# Patient Record
Sex: Female | Born: 1985 | Race: White | Hispanic: No | Marital: Single | Smoking: Former smoker
Health system: Southern US, Community
[De-identification: ages and names within clinical notes are randomized; demographics above are authoritative.]

## PROBLEM LIST (undated history)

## (undated) ENCOUNTER — Inpatient Hospital Stay (HOSPITAL_COMMUNITY): Payer: Self-pay

## (undated) ENCOUNTER — Inpatient Hospital Stay: Payer: Self-pay

## (undated) DIAGNOSIS — F32A Depression, unspecified: Secondary | ICD-10-CM

## (undated) DIAGNOSIS — D649 Anemia, unspecified: Secondary | ICD-10-CM

## (undated) DIAGNOSIS — F1490 Cocaine use, unspecified, uncomplicated: Secondary | ICD-10-CM

## (undated) DIAGNOSIS — A549 Gonococcal infection, unspecified: Secondary | ICD-10-CM

## (undated) DIAGNOSIS — Z87440 Personal history of urinary (tract) infections: Secondary | ICD-10-CM

## (undated) DIAGNOSIS — N390 Urinary tract infection, site not specified: Secondary | ICD-10-CM

## (undated) DIAGNOSIS — F1010 Alcohol abuse, uncomplicated: Secondary | ICD-10-CM

## (undated) DIAGNOSIS — O99320 Drug use complicating pregnancy, unspecified trimester: Secondary | ICD-10-CM

## (undated) DIAGNOSIS — F1210 Cannabis abuse, uncomplicated: Secondary | ICD-10-CM

## (undated) DIAGNOSIS — R87620 Atypical squamous cells of undetermined significance on cytologic smear of vagina (ASC-US): Secondary | ICD-10-CM

## (undated) DIAGNOSIS — O0930 Supervision of pregnancy with insufficient antenatal care, unspecified trimester: Secondary | ICD-10-CM

## (undated) DIAGNOSIS — Z641 Problems related to multiparity: Secondary | ICD-10-CM

## (undated) DIAGNOSIS — Z8619 Personal history of other infectious and parasitic diseases: Secondary | ICD-10-CM

## (undated) DIAGNOSIS — A749 Chlamydial infection, unspecified: Secondary | ICD-10-CM

## (undated) HISTORY — DX: Personal history of urinary (tract) infections: Z87.440

## (undated) HISTORY — DX: Depression, unspecified: F32.A

## (undated) HISTORY — PX: OTHER SURGICAL HISTORY: SHX169

## (undated) HISTORY — PX: NO PAST SURGERIES: SHX2092

## (undated) HISTORY — DX: Anemia, unspecified: D64.9

---
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
## 2006-02-28 ENCOUNTER — Ambulatory Visit (HOSPITAL_COMMUNITY): Admission: RE | Admit: 2006-02-28 | Discharge: 2006-02-28 | Payer: Self-pay | Admitting: Obstetrics

## 2006-05-08 ENCOUNTER — Inpatient Hospital Stay (HOSPITAL_COMMUNITY): Admission: AD | Admit: 2006-05-08 | Discharge: 2006-05-08 | Payer: Self-pay | Admitting: Obstetrics & Gynecology

## 2006-06-29 ENCOUNTER — Inpatient Hospital Stay (HOSPITAL_COMMUNITY): Admission: AD | Admit: 2006-06-29 | Discharge: 2006-06-30 | Payer: Self-pay | Admitting: Obstetrics & Gynecology

## 2006-07-09 ENCOUNTER — Inpatient Hospital Stay (HOSPITAL_COMMUNITY): Admission: AD | Admit: 2006-07-09 | Discharge: 2006-07-10 | Payer: Self-pay | Admitting: Obstetrics & Gynecology

## 2006-07-29 ENCOUNTER — Inpatient Hospital Stay (HOSPITAL_COMMUNITY): Admission: AD | Admit: 2006-07-29 | Discharge: 2006-07-29 | Payer: Self-pay | Admitting: Obstetrics

## 2006-07-30 ENCOUNTER — Inpatient Hospital Stay (HOSPITAL_COMMUNITY): Admission: AD | Admit: 2006-07-30 | Discharge: 2006-07-31 | Payer: Self-pay | Admitting: Obstetrics

## 2007-05-25 ENCOUNTER — Emergency Department (HOSPITAL_COMMUNITY): Admission: EM | Admit: 2007-05-25 | Discharge: 2007-05-25 | Payer: Self-pay | Admitting: Emergency Medicine

## 2007-06-01 ENCOUNTER — Emergency Department (HOSPITAL_COMMUNITY): Admission: EM | Admit: 2007-06-01 | Discharge: 2007-06-01 | Payer: Self-pay | Admitting: Emergency Medicine

## 2007-08-19 ENCOUNTER — Emergency Department (HOSPITAL_COMMUNITY): Admission: EM | Admit: 2007-08-19 | Discharge: 2007-08-19 | Payer: Self-pay | Admitting: Emergency Medicine

## 2008-09-29 ENCOUNTER — Emergency Department (HOSPITAL_COMMUNITY): Admission: EM | Admit: 2008-09-29 | Discharge: 2008-09-29 | Payer: Self-pay | Admitting: Emergency Medicine

## 2010-12-01 LAB — KOH PREP: KOH Prep: NONE SEEN

## 2010-12-01 LAB — POCT RAPID STREP A: Streptococcus, Group A Screen (Direct): POSITIVE — AB

## 2010-12-01 LAB — CULTURE, FUNGUS WITHOUT SMEAR

## 2011-02-07 ENCOUNTER — Encounter (HOSPITAL_COMMUNITY): Payer: Self-pay

## 2011-02-07 ENCOUNTER — Inpatient Hospital Stay (HOSPITAL_COMMUNITY)
Admission: AD | Admit: 2011-02-07 | Discharge: 2011-02-07 | Disposition: A | Discharge status: Home / Self Care | Payer: Medicaid Other | Source: Ambulatory Visit | Attending: Obstetrics & Gynecology | Admitting: Obstetrics & Gynecology

## 2011-02-07 DIAGNOSIS — O0930 Supervision of pregnancy with insufficient antenatal care, unspecified trimester: Secondary | ICD-10-CM | POA: Insufficient documentation

## 2011-02-07 DIAGNOSIS — R109 Unspecified abdominal pain: Secondary | ICD-10-CM | POA: Insufficient documentation

## 2011-02-07 DIAGNOSIS — Z3490 Encounter for supervision of normal pregnancy, unspecified, unspecified trimester: Secondary | ICD-10-CM

## 2011-02-07 DIAGNOSIS — O99891 Other specified diseases and conditions complicating pregnancy: Secondary | ICD-10-CM | POA: Insufficient documentation

## 2011-02-07 LAB — URINALYSIS, ROUTINE W REFLEX MICROSCOPIC
Bilirubin Urine: NEGATIVE
Nitrite: NEGATIVE
Protein, ur: NEGATIVE mg/dL
Specific Gravity, Urine: 1.02 (ref 1.005–1.030)
Urobilinogen, UA: 0.2 mg/dL (ref 0.0–1.0)

## 2011-02-07 LAB — URINE MICROSCOPIC-ADD ON

## 2011-02-07 MED ORDER — PRENATAL RX 60-1 MG PO TABS: 1.0000 | ORAL_TABLET | Freq: Every day | ORAL | Status: DC

## 2011-02-07 NOTE — ED Notes (Signed)
Patient states in addition to concerns mentioned on arrival, she has noticed some soreness and "peeling" around L nipple. Upon exam, appears to be scaly or crusty, but does not give appearance of infection. No redness or swelling.

## 2011-02-07 NOTE — ED Provider Notes (Addendum)
History     Chief Complaint  Patient presents with  . Abdominal Pain   HPI Anum L Sedberry 25 y.o. female  [id number] at [redacted]w[redacted]d with no prenatal care attempting to establish.   Patient with no complaints. ROS shows discharge 1 week ago which resolved with femina women's wash. Cramping in lower abdomen 1-2x a day for short period while resting.   Patient denies contractions/decreased fetal movement/blood from vagina/rush of fluid.     OB History    Grav Para Term Preterm Abortions TAB SAB Ect Mult Living   6 5 5  0 0 0 0 0 0 5      History reviewed. No pertinent past medical history.  History reviewed. No pertinent past surgical history.  Family History  Problem Relation Age of Onset  . Anesthesia problems Neg Hx     History  Substance Use Topics  . Smoking status: Current Everyday Smoker -- 0.2 packs/day  . Smokeless tobacco: Never Used  . Alcohol Use: No    Allergies: No Known Allergies  Prescriptions prior to admission  Medication Sig Dispense Refill  . acetaminophen (TYLENOL) 500 MG tablet Take 1,000 mg by mouth every 6 (six) hours as needed. Takes for headaches         ROS negative except as noted in HPI   Physical Exam   Blood pressure 112/60, pulse 107, temperature 98.5 F (36.9 C), temperature source Oral, resp. rate 20, height 5\' 3"  (1.6 m), weight 79.652 kg (175 lb 9.6 oz), last menstrual period 08/29/2010, SpO2 98.00%.  Physical Exam  Constitutional: She is oriented to person, place, and time. She appears well-developed and well-nourished. No distress.  Cardiovascular: Normal rate and regular rhythm.  Exam reveals no gallop and no friction rub.   No murmur heard. Respiratory: Breath sounds normal. No respiratory distress. She has no wheezes. She has no rales.  GI:       Gravid. Size consistent with 23 week IUP.   Musculoskeletal: Normal range of motion.  Neurological: She is alert and oriented to person, place, and time.  Psychiatric: She has a normal  mood and affect. Her behavior is normal.   FHT-no contractions. 150 baseline. Moderate variability. No accels or decels.  MAU Course  Procedures  MDM Needs to establish [facility] Discharge resolved 1 week ago.    Assessment and Plan  #1 [id number] at [redacted]w[redacted]d #2 insufficient prenatal care #3 no acute issues.   Given health department contact information and stressed importance of follow up. Patient expresses understanding but says she has tried before. Told patient she needs to go there everyday until she gets established.  Rx for prenatal vitamin.  Given handout of providers as well as specific information on how to enroll with [facility].   Case discussed with Philipp Deputy, CNM  HUNTER, STEPHEN 02/07/2011, 2:46 PM

## 2011-02-07 NOTE — Progress Notes (Signed)
Patient states she has had no prenatal care. Has been having some lower abdominal cramping and a vaginal discharge with a slight odor. Used OTC feminine product that caused irritation. Reports good fetal movement, no bleeding or leaking.

## 2011-03-07 NOTE — L&D Delivery Note (Signed)
Delivery Note At 7:27 PM a viable and healthy female was delivered via Vaginal, Spontaneous Delivery (Presentation: ; Occiput Anterior).  APGAR: 8, 9; weight .   Placenta status: , Spontaneous.  Cord: 3 vessels with the following complications: .  Cord pH: Not indicated  Anesthesia: Epidural  Episiotomy: None Lacerations: None Suture Repair: N/A Est. Blood Loss (mL):  Mom to postpartum.  Baby to nursery-stable. Cathie Beams, CNM present throughout entire delivery.  Glendell Fouse 05/31/2011, 7:51 PM

## 2011-04-02 ENCOUNTER — Encounter (HOSPITAL_COMMUNITY): Payer: Self-pay | Admitting: Emergency Medicine

## 2011-04-02 ENCOUNTER — Emergency Department (HOSPITAL_COMMUNITY)
Admission: EM | Admit: 2011-04-02 | Discharge: 2011-04-02 | Disposition: A | Discharge status: Home / Self Care | Payer: Medicaid Other | Attending: Emergency Medicine | Admitting: Emergency Medicine

## 2011-04-02 DIAGNOSIS — L2989 Other pruritus: Secondary | ICD-10-CM | POA: Insufficient documentation

## 2011-04-02 DIAGNOSIS — B009 Herpesviral infection, unspecified: Secondary | ICD-10-CM | POA: Insufficient documentation

## 2011-04-02 DIAGNOSIS — O9989 Other specified diseases and conditions complicating pregnancy, childbirth and the puerperium: Secondary | ICD-10-CM | POA: Insufficient documentation

## 2011-04-02 DIAGNOSIS — R21 Rash and other nonspecific skin eruption: Secondary | ICD-10-CM | POA: Insufficient documentation

## 2011-04-02 DIAGNOSIS — L298 Other pruritus: Secondary | ICD-10-CM | POA: Insufficient documentation

## 2011-04-02 DIAGNOSIS — L089 Local infection of the skin and subcutaneous tissue, unspecified: Secondary | ICD-10-CM | POA: Insufficient documentation

## 2011-04-02 DIAGNOSIS — B001 Herpesviral vesicular dermatitis: Secondary | ICD-10-CM

## 2011-04-02 MED ORDER — CEPHALEXIN 500 MG PO CAPS: 500.0000 mg | ORAL_CAPSULE | Freq: Four times a day (QID) | ORAL | Status: AC

## 2011-04-02 NOTE — ED Provider Notes (Signed)
Medical screening examination/treatment/procedure(s) were conducted as a shared visit with non-physician practitioner(s) and myself.  I personally evaluated the patient during the encounter  Lesion to the face which has been going on for the past 2 days. Associated pain. His had similar symptoms in the past.  Herpetic lesion to the lip.  Dayton Bailiff, MD 04/02/11 1700

## 2011-04-02 NOTE — ED Provider Notes (Signed)
History     CSN: [id number]  Arrival date & time 04/02/11  1525   First MD Initiated Contact with Patient 04/02/11 1551      Chief Complaint  Patient presents with  . Abscess    (Consider location/radiation/quality/duration/timing/severity/associated sxs/prior treatment)  HPI 26 year old white pregnant female presents with lip rash and sore. The onset was 2 days ago and gradual. She reports increased swelling but denies pain and itching. She denies a viral prodrome of itching and burning. She denies similar symptoms in past. She has not tried anything for relief. She also complains of a pruritic rash. It began several months ago but since she became pregnant. It started on her R elbow, then spread to her L elbow, then chest, then feet and ankles. She describes it as intermittent and lasts for about a week at a time. She has not tried anything for relief.   History reviewed. No pertinent past medical history.  History reviewed. No pertinent past surgical history.  Family History  Problem Relation Age of Onset  . Anesthesia problems Neg Hx     History  Substance Use Topics  . Smoking status: Current Everyday Smoker -- 0.2 packs/day  . Smokeless tobacco: Never Used  . Alcohol Use: No    OB History    Grav Para Term Preterm Abortions TAB SAB Ect Mult Living   6 5 5  0 0 0 0 0 0 5      Review of Systems All pertinent positives and negatives reviewed in the history of present illness  Allergies  Review of patient's allergies indicates no known allergies.  Home Medications   Current Outpatient Rx  Name Route Sig Dispense Refill  . NAPROXEN SODIUM 220 MG PO TABS Oral Take 220 mg by mouth daily as needed. For pain      BP 110/53  Pulse 102  Temp(Src) 98.2 F (36.8 C) (Oral)  Resp 18  SpO2 97%  LMP 08/29/2010  Physical Exam  Constitutional: She appears well-developed and well-nourished.  HENT:       Patient has a area to the left upper lip laterally and appears to  be either herpes type rash or a small area of skin infection.  Still feel that herpes labialis the most likely cause here but the patient feels that this is not like her previous.  She states she peeled back some skin over this area about 3 days ago  Eyes: Pupils are equal, round, and reactive to light.  Cardiovascular: Normal rate, regular rhythm and normal heart sounds.   Pulmonary/Chest: Effort normal and breath sounds normal.  Skin: Skin is warm and dry. Rash noted.    ED Course  Procedures (including critical care time)     The patient has a herpes labialis vs small infection of the L upper lip. I feel that this is a cold sore rather than an infection but the patient is concerned about an infection. I advised she can use warm compresses. Told to return here as needed. Follow up at [facility] for her pregnancy.      MDM          Carlyle Dolly, PA-C 04/02/11 [phone]

## 2011-04-02 NOTE — ED Notes (Addendum)
Pt reports waking yesterday w/sore to L corner of mouth and L top lip. Pt also states she is Rh negative and is requesting to receive Rhogam injection. Pt reports she is unable to get a ride to a Dr., pt denies receiving any prenatal care. Pt was dropped off by a friend today

## 2011-04-02 NOTE — ED Notes (Signed)
Pt informed to f/u at [facility] tomorrow for evaluation and f/u care for Rhogam, EDP King and EDPA Lawyer unable to locate needed info and has instructed Clinical research associate to have pt f/u w/MAU. This Clinical research associate spoke w/rapid response OBGYN RN Tresa Endo, she Web designer pt has until 32 wks to receive Rhogam injection, pt is informed of the urgency to receive injection prior to 32 wks

## 2011-04-02 NOTE — ED Notes (Addendum)
C/o bump on lip since Saturday morning.  Also c/o rash on neck, arms, and feet for over 1 month.  Pt states she is 7 months pregnant.  Pt states she is Rh negative and has had no prenatal care. Pt states she has no way to get to a doctor's appt and that she really needs to get Rhogam while she is here. This is her 6th pregnancy.

## 2011-04-04 ENCOUNTER — Encounter (HOSPITAL_COMMUNITY): Payer: Self-pay | Admitting: *Deleted

## 2011-04-04 ENCOUNTER — Inpatient Hospital Stay (HOSPITAL_COMMUNITY)
Admission: AD | Admit: 2011-04-04 | Discharge: 2011-04-04 | Disposition: A | Discharge status: Home / Self Care | Payer: Medicaid Other | Source: Ambulatory Visit | Attending: Obstetrics and Gynecology | Admitting: Obstetrics and Gynecology

## 2011-04-04 DIAGNOSIS — R21 Rash and other nonspecific skin eruption: Secondary | ICD-10-CM | POA: Insufficient documentation

## 2011-04-04 DIAGNOSIS — O2390 Unspecified genitourinary tract infection in pregnancy, unspecified trimester: Secondary | ICD-10-CM | POA: Insufficient documentation

## 2011-04-04 DIAGNOSIS — O0930 Supervision of pregnancy with insufficient antenatal care, unspecified trimester: Secondary | ICD-10-CM | POA: Insufficient documentation

## 2011-04-04 DIAGNOSIS — N390 Urinary tract infection, site not specified: Secondary | ICD-10-CM | POA: Insufficient documentation

## 2011-04-04 DIAGNOSIS — O98519 Other viral diseases complicating pregnancy, unspecified trimester: Secondary | ICD-10-CM | POA: Insufficient documentation

## 2011-04-04 DIAGNOSIS — B009 Herpesviral infection, unspecified: Secondary | ICD-10-CM | POA: Insufficient documentation

## 2011-04-04 DIAGNOSIS — O99891 Other specified diseases and conditions complicating pregnancy: Secondary | ICD-10-CM | POA: Insufficient documentation

## 2011-04-04 HISTORY — DX: Chlamydial infection, unspecified: A74.9

## 2011-04-04 LAB — RAPID URINE DRUG SCREEN, HOSP PERFORMED
Benzodiazepines: NOT DETECTED
Cocaine: NOT DETECTED
Opiates: NOT DETECTED
Tetrahydrocannabinol: POSITIVE — AB

## 2011-04-04 LAB — URINALYSIS, ROUTINE W REFLEX MICROSCOPIC
Glucose, UA: NEGATIVE mg/dL
Nitrite: NEGATIVE
Specific Gravity, Urine: 1.025 (ref 1.005–1.030)
pH: 6 (ref 5.0–8.0)

## 2011-04-04 LAB — DIFFERENTIAL
Eosinophils Relative: 1 % (ref 0–5)
Lymphocytes Relative: 15 % (ref 12–46)
Lymphs Abs: 1.9 10*3/uL (ref 0.7–4.0)
Monocytes Relative: 6 % (ref 3–12)

## 2011-04-04 LAB — URINE MICROSCOPIC-ADD ON

## 2011-04-04 LAB — CBC
HCT: 34.2 % — ABNORMAL LOW (ref 36.0–46.0)
Hemoglobin: 11.6 g/dL — ABNORMAL LOW (ref 12.0–15.0)
MCH: 29.6 pg (ref 26.0–34.0)
MCV: 87.2 fL (ref 78.0–100.0)
Platelets: 148 10*3/uL — ABNORMAL LOW (ref 150–400)
RBC: 3.92 MIL/uL (ref 3.87–5.11)
WBC: 12.6 10*3/uL — ABNORMAL HIGH (ref 4.0–10.5)

## 2011-04-04 LAB — HEPATITIS B SURFACE ANTIGEN: Hepatitis B Surface Ag: NEGATIVE

## 2011-04-04 LAB — TYPE AND SCREEN: ABO/RH(D): A NEG

## 2011-04-04 MED ORDER — PERMETHRIN 5 % EX CREA: TOPICAL_CREAM | Freq: Once | CUTANEOUS | Status: AC

## 2011-04-04 NOTE — ED Provider Notes (Signed)
History     No chief complaint on file.  HPI  3yr [id number] at 31.1wks by LMP presents for prenatal care  Patient states that her LMP was 08/29/10, and she has not been able to establish prenatal care because of transportation issues. Does not think that she has any issues with this pregnancy, and describes good fetal movement. She denies any leakage of fluid or discharge. She is not feeling any contractions. She states that she is Rh negative and has received rhogam in past pregnancies.   She also complains of a lesion on her left upper lip. She was seen at [facility] for similar approx 4 days ago, was told it was either a cold sore, herpes, or hormonal changes of pregnancy. She was given a Rx for Cephalexin for this. She states that she has had cold sores in the past but not like this. Some prodomal tingling was present a few days prior. Denies any other lesions, denies any genital lesions, denies any history of genital lesions. Denies any partners with similar symptoms.   Patient also complains of pruritic rash on antecubital fossa, wrists, within finger webs, that has been present for approximately one week. She seems to think that her daughter has similar symptoms. She denies any rashes or spots on her truncal area. She has had scabies before and seems to think that her current symptoms are similar.   Socially, she lives with the father of her previous children. She states that she does not know the exact father of this pregnancy. She admits to smoking with this pregnancy, as well as EtOH and MJ use. She denies any other illicit drugs, has never used IV drugs.     OB History    Grav Para Term Preterm Abortions TAB SAB Ect Mult Living   6 5 5  0 0 0 0 0 0 5      Past Medical History  Diagnosis Date  . Asthma     as child  . Chlamydia     Past Surgical History  Procedure Date  . No past surgeries     Family History  Problem Relation Age of Onset  . Anesthesia problems Neg Hx       History  Substance Use Topics  . Smoking status: Current Everyday Smoker -- 0.2 packs/day for 14 years  . Smokeless tobacco: Never Used  . Alcohol Use: No     not with preg    Allergies: No Known Allergies  Prescriptions prior to admission  Medication Sig Dispense Refill  . naproxen sodium (ANAPROX) 220 MG tablet Take 220 mg by mouth daily as needed. For pain      . Prenatal Vit-Fe Fumarate-FA (PRENATAL MULTIVITAMIN) TABS Take 1 tablet by mouth daily.      . cephALEXin (KEFLEX) 500 MG capsule Take 1 capsule (500 mg total) by mouth 4 (four) times daily.  28 capsule  0    Review of Systems  Constitutional: Negative for fever.  Eyes: Negative for blurred vision and double vision.  Respiratory: Negative for shortness of breath.   Cardiovascular: Negative for chest pain.  Gastrointestinal: Negative for heartburn, nausea, vomiting, abdominal pain, diarrhea and constipation.  Skin: Positive for itching and rash (see HPI).  Neurological: Negative for headaches.  All other systems reviewed and are negative.   Physical Exam   Blood pressure 118/59, pulse 93, temperature 98.7 F (37.1 C), temperature source Oral, resp. rate 20, height 5' 3.5" (1.613 m), weight 83.28 kg (183 lb 9.6  oz), last menstrual period 08/29/2010.  Physical Exam  Nursing note and vitals reviewed. Constitutional: She is oriented to person, place, and time. She appears well-developed and well-nourished. No distress.  HENT:  Head: Normocephalic and atraumatic.  Mouth/Throat: Oral lesions present.    Eyes: EOM are normal. Pupils are equal, round, and reactive to light.  Cardiovascular: Normal rate, regular rhythm and normal heart sounds.  Exam reveals no gallop and no friction rub.   No murmur heard. Respiratory: Effort normal and breath sounds normal. No respiratory distress. She has no wheezes. She has no rales. She exhibits no tenderness.  GI: Soft. She exhibits no distension and no mass. There is no  tenderness. There is no rebound and no guarding.       Gravid abdomen, fundal height 29cm  Musculoskeletal: Normal range of motion. She exhibits no edema and no tenderness.  Neurological: She is alert and oriented to person, place, and time. She has normal reflexes.  Skin: Skin is warm and dry. Rash noted. No purpura noted. Rash is papular, maculopapular, vesicular and urticarial. Rash is not nodular. No erythema. No pallor.     Psychiatric: She has a normal mood and affect. Her behavior is normal. Judgment and thought content normal.    MAU Course  Procedures  MDM 0400 - intital assessment. Evaluated patient, will obtain basic prenatal labs, and schedule outpatient followup. UA pending , will add on UDS. Anticipate DC to home, permethrin cream for scabies.  Assessment and Plan  Pregnancy  - no prenatal care  - have scheduled appointment for patient in low risk clinic tomorrow  - will help patient with bus passes for transportation  - will obtain basic prenatal labs today  - Patient is a(-); patient will need Rhogam   - UDS pending  Facial Lesion  - most like herpetic  - educated patient on natural course of disease progression  Rash  - given distribution of skin folds, and similar Sx in close contacts, scabies is high on differential  - PUPPP is on differential, but absence of skin lesions on truncal areas makes this less likely  - will treat with permethrin cream, information given on proper infestation eradication  UTI  - UA mildly concerning for UTI  - patient was given kefflex 2d ago at [facility]  - urged patient to fill that prescription    Cameron Proud 04/04/2011, 4:28 PM

## 2011-04-04 NOTE — Progress Notes (Signed)
Went to [facility] on Sun, to get lip lesion checked out, was told it was due to hormones.  Pt was encouraged to get prenatal care.  Pt had told them she needed Rhogham.  Was instructed to come here for check up, [place] and Rhogham.  Has been having braxton hicks, nothing contstant or regular, but is worried about it.

## 2011-04-05 ENCOUNTER — Encounter: Payer: Self-pay | Admitting: *Deleted

## 2011-04-05 ENCOUNTER — Ambulatory Visit (INDEPENDENT_AMBULATORY_CARE_PROVIDER_SITE_OTHER): Payer: Self-pay | Admitting: Obstetrics and Gynecology

## 2011-04-05 DIAGNOSIS — Z3480 Encounter for supervision of other normal pregnancy, unspecified trimester: Secondary | ICD-10-CM

## 2011-04-05 DIAGNOSIS — Z3490 Encounter for supervision of normal pregnancy, unspecified, unspecified trimester: Secondary | ICD-10-CM | POA: Insufficient documentation

## 2011-04-05 DIAGNOSIS — Z6791 Unspecified blood type, Rh negative: Secondary | ICD-10-CM

## 2011-04-05 DIAGNOSIS — O360990 Maternal care for other rhesus isoimmunization, unspecified trimester, not applicable or unspecified: Secondary | ICD-10-CM

## 2011-04-05 DIAGNOSIS — O0930 Supervision of pregnancy with insufficient antenatal care, unspecified trimester: Secondary | ICD-10-CM | POA: Insufficient documentation

## 2011-04-05 LAB — POCT URINALYSIS DIP (DEVICE)
Ketones, ur: NEGATIVE mg/dL
Leukocytes, UA: NEGATIVE
Protein, ur: NEGATIVE mg/dL
Specific Gravity, Urine: >=1.03 (ref 1.005–1.030)
Urobilinogen, UA: 0.2 mg/dL (ref 0.0–1.0)
pH: 6 (ref 5.0–8.0)

## 2011-04-05 LAB — GC/CHLAMYDIA PROBE AMP, GENITAL: Gonorrhea: NEGATIVE

## 2011-04-05 MED ORDER — RHO D IMMUNE GLOBULIN 1500 UNIT/2ML IJ SOLN: 300.0000 ug | Freq: Once | INTRAMUSCULAR | Status: DC

## 2011-04-05 NOTE — Progress Notes (Signed)
Rhophylac 1500 IU(364mcg) given IM left ventrogluteal at 11:09, patient tolerated without complaints.   Lot #[id number], exp [id number], NDC [id number]

## 2011-04-05 NOTE — Progress Notes (Signed)
Addended by: Faythe Casa on: 04/05/2011 11:34 AM   Modules accepted: Orders

## 2011-04-05 NOTE — Progress Notes (Signed)
Patient without complaints, allowed to check out

## 2011-04-05 NOTE — Progress Notes (Signed)
Ob US scheduled 04/07/11 @ 1430

## 2011-04-05 NOTE — Progress Notes (Signed)
Addended by: Doreen Salvage on: 04/05/2011 11:32 AM   Modules accepted: Orders

## 2011-04-05 NOTE — Progress Notes (Signed)
[id number] @ 31 2/7 weeks presenting for her initial prenatal visit. Patient had been seen in MAU @ 23 and 31 weeks. No other prenatal care. Past obstetrical, medical, surgical, family, and social history reviewed.  Significant risk factors include history of childhood asthma, current tobacco use, UDS on 01/29 positive for THC, and Rh-negative status. Social history significant for uncertainty who FOB is. FOB of other 5 children, and patient's boyfriend, aware may not be FOB of current baby but will still be involved in baby's care.  Patient complaining of persistent rash on her wrists, popliteal fossa, and right lower extremity. Dx with scabies and given Rx but has not gotten filled yet.  She reports stopping THC about 3-4 days ago. Still smoking 2-3 cigarettes daily.  PE: Gen: NAD Psych: appropriate Neck: no thyromegaly CV: RRR, no m/r/g Pulm: CTAB without w/r/r Abd: NABS, soft, NT, gravid Genital: vulva and vaginal normal without erythema, tenderness, or other lesions. Thick white discharge posterior vault. Cervix non-friable without lesions.   A/P: -Rh negative. Rhogam today.  -1-hour glucola, Pap smear, GC/Chlamydia today. -Social Work consult from hospital today due to finances (patient did not seek prenatal care earlier due to not having transportation). -Will schedule anatomic U/S to confirm dating. Patient unsure of LMP but had not been on birth control.  -Follow-up in 2 weeks.

## 2011-04-05 NOTE — Progress Notes (Signed)
Addended by: Madolyn Frieze, Marylene Land J on: 04/05/2011 11:00 AM   Modules accepted: Orders

## 2011-04-07 ENCOUNTER — Ambulatory Visit (HOSPITAL_COMMUNITY)
Admission: RE | Admit: 2011-04-07 | Discharge: 2011-04-07 | Disposition: A | Discharge status: Home / Self Care | Payer: Medicaid Other | Source: Ambulatory Visit | Attending: Obstetrics and Gynecology | Admitting: Obstetrics and Gynecology

## 2011-04-07 DIAGNOSIS — Z3689 Encounter for other specified antenatal screening: Secondary | ICD-10-CM | POA: Insufficient documentation

## 2011-04-07 DIAGNOSIS — Z3490 Encounter for supervision of normal pregnancy, unspecified, unspecified trimester: Secondary | ICD-10-CM

## 2011-04-07 DIAGNOSIS — O0930 Supervision of pregnancy with insufficient antenatal care, unspecified trimester: Secondary | ICD-10-CM

## 2011-04-07 NOTE — ED Provider Notes (Signed)
Agree with above note.  Tonya Nichols 04/07/2011 6:11 AM   

## 2011-04-10 ENCOUNTER — Telehealth: Payer: Self-pay

## 2011-04-10 ENCOUNTER — Encounter: Payer: Self-pay | Admitting: *Deleted

## 2011-04-10 NOTE — Telephone Encounter (Signed)
Pt called stating she wants a call back.  Called pt and left message stating return our call to the clinics.

## 2011-04-11 NOTE — Telephone Encounter (Signed)
Called pt and was unable to leave message due to "persons voicemail box being full".

## 2011-04-12 NOTE — Telephone Encounter (Signed)
Called pt and heard message stating that the mailbox is full and cannot accept new messages.

## 2011-04-17 ENCOUNTER — Encounter: Payer: Self-pay | Admitting: Family Medicine

## 2011-04-17 ENCOUNTER — Ambulatory Visit (INDEPENDENT_AMBULATORY_CARE_PROVIDER_SITE_OTHER): Payer: Self-pay | Admitting: Family Medicine

## 2011-04-17 VITALS — BP 118/78 | HR 96 | Temp 97.4°F | Wt 185.1 lb

## 2011-04-17 DIAGNOSIS — O0930 Supervision of pregnancy with insufficient antenatal care, unspecified trimester: Secondary | ICD-10-CM

## 2011-04-17 DIAGNOSIS — Z3490 Encounter for supervision of normal pregnancy, unspecified, unspecified trimester: Secondary | ICD-10-CM

## 2011-04-17 LAB — POCT URINALYSIS DIP (DEVICE)
Ketones, ur: NEGATIVE mg/dL
Nitrite: NEGATIVE
Protein, ur: NEGATIVE mg/dL
pH: 6 (ref 5.0–8.0)

## 2011-04-17 NOTE — Patient Instructions (Signed)
Pregnancy - Third Trimester The third trimester of pregnancy (the last 3 months) is a period of the most rapid growth for you and your baby. The baby approaches a length of 20 inches and a weight of 6 to 10 pounds. The baby is adding on fat and getting ready for life outside your body. While inside, babies have periods of sleeping and waking, suck their thumbs, and hiccups. You can often feel small contractions of the uterus. This is false labor. It is also called Braxton-Hicks contractions. This is like a practice for labor. The usual problems in this stage of pregnancy include more difficulty breathing, swelling of the hands and feet from water retention, and having to urinate more often because of the uterus and baby pressing on your bladder.  PRENATAL EXAMS  Blood work may continue to be done during prenatal exams. These tests are done to check on your health and the probable health of your baby. Blood work is used to follow your blood levels (hemoglobin). Anemia (low hemoglobin) is common during pregnancy. Iron and vitamins are given to help prevent this. You may also continue to be checked for diabetes. Some of the past blood tests may be done again.   The size of the uterus is measured during each visit. This makes sure your baby is growing properly according to your pregnancy dates.   Your blood pressure is checked every prenatal visit. This is to make sure you are not getting toxemia.   Your urine is checked every prenatal visit for infection, diabetes and protein.   Your weight is checked at each visit. This is done to make sure gains are happening at the suggested rate and that you and your baby are growing normally.   Sometimes, an ultrasound is performed to confirm the position and the proper growth and development of the baby. This is a test done that bounces harmless sound waves off the baby so your caregiver can more accurately determine due dates.   Discuss the type of pain  medication and anesthesia you will have during your labor and delivery.   Discuss the possibility and anesthesia if a Cesarean Section might be necessary.   Inform your caregiver if there is any mental or physical violence at home.  Sometimes, a specialized non-stress test, contraction stress test and biophysical profile are done to make sure the baby is not having a problem. Checking the amniotic fluid surrounding the baby is called an amniocentesis. The amniotic fluid is removed by sticking a needle into the belly (abdomen). This is sometimes done near the end of pregnancy if an early delivery is required. In this case, it is done to help make sure the baby's lungs are mature enough for the baby to live outside of the womb. If the lungs are not mature and it is unsafe to deliver the baby, an injection of cortisone medication is given to the mother 1 to 2 days before the delivery. This helps the baby's lungs mature and makes it safer to deliver the baby. CHANGES OCCURING IN THE THIRD TRIMESTER OF PREGNANCY Your body goes through many changes during pregnancy. They vary from person to person. Talk to your caregiver about changes you notice and are concerned about.  During the last trimester, you have probably had an increase in your appetite. It is normal to have cravings for certain foods. This varies from person to person and pregnancy to pregnancy.   You may begin to get stretch marks on your hips,   abdomen, and breasts. These are normal changes in the body during pregnancy. There are no exercises or medications to take which prevent this change.   Constipation may be treated with a stool softener or adding bulk to your diet. Drinking lots of fluids, fiber in vegetables, fruits, and whole grains are helpful.   Exercising is also helpful. If you have been very active up until your pregnancy, most of these activities can be continued during your pregnancy. If you have been less active, it is helpful  to start an exercise program such as walking. Consult your caregiver before starting exercise programs.   Avoid all smoking, alcohol, un-prescribed drugs, herbs and "street drugs" during your pregnancy. These chemicals affect the formation and growth of the baby. Avoid chemicals throughout the pregnancy to ensure the delivery of a healthy infant.   Backache, varicose veins and hemorrhoids may develop or get worse.   You will tire more easily in the third trimester, which is normal.   The baby's movements may be stronger and more often.   You may become short of breath easily.   Your belly button may stick out.   A yellow discharge may leak from your breasts called colostrum.   You may have a bloody mucus discharge. This usually occurs a few days to a week before labor begins.  HOME CARE INSTRUCTIONS   Keep your caregiver's appointments. Follow your caregiver's instructions regarding medication use, exercise, and diet.   During pregnancy, you are providing food for you and your baby. Continue to eat regular, well-balanced meals. Choose foods such as meat, fish, milk and other low fat dairy products, vegetables, fruits, and whole-grain breads and cereals. Your caregiver will tell you of the ideal weight gain.   A physical sexual relationship may be continued throughout pregnancy if there are no other problems such as early (premature) leaking of amniotic fluid from the membranes, vaginal bleeding, or belly (abdominal) pain.   Exercise regularly if there are no restrictions. Check with your caregiver if you are unsure of the safety of your exercises. Greater weight gain will occur in the last 2 trimesters of pregnancy. Exercising helps:   Control your weight.   Get you in shape for labor and delivery.   You lose weight after you deliver.   Rest a lot with legs elevated, or as needed for leg cramps or low back pain.   Wear a good support or jogging bra for breast tenderness during  pregnancy. This may help if worn during sleep. Pads or tissues may be used in the bra if you are leaking colostrum.   Do not use hot tubs, steam rooms, or saunas.   Wear your seat belt when driving. This protects you and your baby if you are in an accident.   Avoid raw meat, cat litter boxes and soil used by cats. These carry germs that can cause birth defects in the baby.   It is easier to loose urine during pregnancy. Tightening up and strengthening the pelvic muscles will help with this problem. You can practice stopping your urination while you are going to the bathroom. These are the same muscles you need to strengthen. It is also the muscles you would use if you were trying to stop from passing gas. You can practice tightening these muscles up 10 times a set and repeating this about 3 times per day. Once you know what muscles to tighten up, do not perform these exercises during urination. It is more likely   to cause an infection by backing up the urine.   Ask for help if you have financial, counseling or nutritional needs during pregnancy. Your caregiver will be able to offer counseling for these needs as well as refer you for other special needs.   Make a list of emergency phone numbers and have them available.   Plan on getting help from family or friends when you go home from the hospital.   Make a trial run to the hospital.   Take prenatal classes with the father to understand, practice and ask questions about the labor and delivery.   Prepare the baby's room/nursery.   Do not travel out of the city unless it is absolutely necessary and with the advice of your caregiver.   Wear only low or no heal shoes to have better balance and prevent falling.  MEDICATIONS AND DRUG USE IN PREGNANCY  Take prenatal vitamins as directed. The vitamin should contain 1 milligram of folic acid. Keep all vitamins out of reach of children. Only a couple vitamins or tablets containing iron may be fatal  to a baby or young child when ingested.   Avoid use of all medications, including herbs, over-the-counter medications, not prescribed or suggested by your caregiver. Only take over-the-counter or prescription medicines for pain, discomfort, or fever as directed by your caregiver. Do not use aspirin, ibuprofen (Motrin, Advil, Nuprin) or naproxen (Aleve) unless OK'd by your caregiver.   Let your caregiver also know about herbs you may be using.   Alcohol is related to a number of birth defects. This includes fetal alcohol syndrome. All alcohol, in any form, should be avoided completely. Smoking will cause low birth rate and premature babies.   Street/illegal drugs are very harmful to the baby. They are absolutely forbidden. A baby born to an addicted mother will be addicted at birth. The baby will go through the same withdrawal an adult does.  SEEK MEDICAL CARE IF: You have any concerns or worries during your pregnancy. It is better to call with your questions if you feel they cannot wait, rather than worry about them. DECISIONS ABOUT CIRCUMCISION You may or may not know the sex of your baby. If you know your baby is a boy, it may be time to think about circumcision. Circumcision is the removal of the foreskin of the penis. This is the skin that covers the sensitive end of the penis. There is no proven medical need for this. Often this decision is made on what is popular at the time or based upon religious beliefs and social issues. You can discuss these issues with your caregiver or pediatrician. SEEK IMMEDIATE MEDICAL CARE IF:   An unexplained oral temperature above 102 F (38.9 C) develops, or as your caregiver suggests.   You have leaking of fluid from the vagina (birth canal). If leaking membranes are suspected, take your temperature and tell your caregiver of this when you call.   There is vaginal spotting, bleeding or passing clots. Tell your caregiver of the amount and how many pads are  used.   You develop a bad smelling vaginal discharge with a change in the color from clear to white.   You develop vomiting that lasts more than 24 hours.   You develop chills or fever.   You develop shortness of breath.   You develop burning on urination.   You loose more than 2 pounds of weight or gain more than 2 pounds of weight or as suggested by your   caregiver.   You notice sudden swelling of your face, hands, and feet or legs.   You develop belly (abdominal) pain. Round ligament discomfort is a common non-cancerous (benign) cause of abdominal pain in pregnancy. Your caregiver still must evaluate you.   You develop a severe headache that does not go away.   You develop visual problems, blurred or double vision.   If you have not felt your baby move for more than 1 hour. If you think the baby is not moving as much as usual, eat something with sugar in it and lie down on your left side for an hour. The baby should move at least 4 to 5 times per hour. Call right away if your baby moves less than that.   You fall, are in a car accident or any kind of trauma.   There is mental or physical violence at home.  Document Released: 02/14/2001 Document Revised: 11/02/2010 Document Reviewed: 08/19/2008 [employer] Patient Information 2012 [employer]. Birth Control Choices Birth control is the use of any practices, methods, or devices to prevent pregnancy from happening in a sexually active woman.  Below are some birth control choices to help avoid pregnancy.  Not having sex (abstinence) is the surest form of birth control. This requires self-control. There is no risk of acquiring a sexually transmitted disease (STD), including acquired immunodeficiency syndrome (AIDS).   Periodic abstinence requires self-control during certain times of the month.   Calendar method, timing your menstrual periods from month to month.   Ovulation method is avoiding sexual intercourse around the time  you produce an egg (ovulate).   Symptotherm method is avoiding sexual intercourse at the time of ovulation, using a thermometer and ovulation symptoms.   Post ovulation method is the timing of sexual intercourse after you ovulated.  These methods do not protect against STDs, including AIDS.  Birth control pills (BCPs) contain estrogen and progesterone hormone. These medicines work by stopping the egg from forming in the ovary (ovulation). Birth control pills are prescribed by a caregiver who will ask you questions about the risks of taking BCPs. Birth control pills do not protect against STDs, including AIDS.   "Minipill" birth control pills have only the progesterone hormone. They are taken every day of each month and must be prescribed by your caregiver. They do not protect against STDs, including AIDS.   Emergency contraception is often call the "morning after" pill. This pill can be taken right after sex or up to five days after sex if you think your birth control failed, you failed to use contraception, or you were forced to have sex. It is most effective the sooner you take the pills after having sexual intercourse. Do not use emergency contraception as your only form of birth control. Emergency contraceptive pills are available without a prescription. Check with your pharmacist.   Condoms are a thin sheath of latex, synthetic material, or lambskin worn over the penis during sexual intercourse. They can have a spermicide in or on them when you buy them. Latex condoms can prevent pregnancy and STDs. "Natural" or lambskin condoms can prevent pregnancy but may not protect against STDs, including AIDS.   Female condoms are a soft, loose-fitting sheath that is put into the vagina before sexual intercourse. They can prevent pregnancy and STDs, including AIDS.   Sponge is a soft, circular piece of polyurethane foam with spermicide in it that is inserted into the vagina after wetting it and before  sexual intercourse. It does   not require a prescription from your caregiver. It does not protect against STDs, including AIDS.   Diaphragm is a soft, latex, dome-shaped barrier that must be fitted by a caregiver. It is inserted into the vagina, along with a spermicidal jelly. After the proper fitting for a diaphragm, always insert the diaphragm before intercourse. The diaphragm should be left in the vagina for 6 to 8 hours after intercourse. Removal and reinsertion with a spermicide is always necessary after any use. It does not protect against STDs, including AIDS.   Progesterone-only injections are given every 3 months to prevent pregnancy. These injections contain synthetic progesterone and no estrogen. This hormone stops the ovaries from releasing eggs. It also causes the cervical mucus to thicken and changes the uterine lining. This makes it harder for sperm to survive in the uterus. It does not protect against STDs, including AIDS.   Birth Control Patch contains hormones similar to those in birth control pills, so effectiveness, risks, and side effects are similar. It must be changed once a week and is prescribed by a caregiver. It is less effective in very overweight women. It does not protect against STDs, including AIDS.   Vaginal Ring contains hormones similar to those in birth control pills. It is left in place for 3 weeks, removed for 1 week, and then a new one is put back into the vagina. It comes with a timer to put in your purse to help you remember when to take it out or put a new one in. A caregiver's examination and prescription is necessary, just like with birth control pills and the patch. It does not protect against STDs, including AIDS.   Estrogen plus progesterone injections are given every 28 to 30 days. They can be given in the upper arm, thigh, or buttocks. It does not protect against STDs, including AIDS.   Intrauterine device (IUD): copper T or progestin filled is a T-shaped  device that is put in a woman's uterus during a menstrual period to prevent pregnancy. The copper T IUD can last 10 years, and the progestin IUD can last 5 years. The progestin IUD can also help control heavy menstrual periods. It does not protect against STDs, including AIDS. The copper T IUD can be used as emergency contraception if inserted within 5 days of having unprotected intercourse.   Cervical cap is a round, soft latex or plastic cup that fits over the cervix and must be fitted by a caregiver. You do not need to use a spermicide with it or remove and insert it every time you have sexual intercourse. It does not protect against STDs, including AIDS.   Spermicides are chemicals that kill or block sperm from entering the cervix and uterus. They come in the form of creams, jellies, suppositories, foam, or tablets, and they do not require a prescription. They are inserted into the vagina with an applicator before having sexual intercourse. This must be repeated every time you have sexual intercourse.   Withdrawal is using the method of the female withdrawing his penis from sexual intercourse before he has a climax and deposits his sperm. It does not protect against STDs, including AIDS.   Female tubal ligation is when the woman's fallopian tubes are surgically sealed or tied to prevent the egg from traveling to the uterus. It does not protect against STDs, including AIDS.   Female sterilization is when the female has his tubes that carry sperm tied off (vasectomy) to stop sperm from entering the   vagina during sexual intercourse. It does not protect against STDs, including AIDS.  Regardless of which method of birth control you choose, it is still important that you use some form of protection against STDs. Document Released: 02/20/2005 Document Revised: 03/25/2010 Document Reviewed: 01/07/2009 [employer] Patient Information 2012 [employer]. Breastfeeding BENEFITS OF BREASTFEEDING For the baby  The  first milk (colostrum) helps the baby's digestive system function better.   There are antibodies from the mother in the milk that help the baby fight off infections.   The baby has a lower incidence of asthma, allergies, and SIDS (sudden infant death syndrome).   The nutrients in breast milk are better than formulas for the baby and helps the baby's brain grow better.   Babies who breastfeed have less gas, colic, and constipation.  For the mother  Breastfeeding helps develop a very special bond between mother and baby.   It is more convenient, always available at the correct temperature and cheaper than formula feeding.   It burns calories in the mother and helps with losing weight that was gained during pregnancy.   It makes the uterus contract back down to normal size faster and slows bleeding following delivery.   Breastfeeding mothers have a lower risk of developing breast cancer.  NURSE FREQUENTLY  A healthy, full-term baby may breastfeed as often as every hour or space his or her feedings to every 3 hours.   How often to nurse will vary from baby to baby. Watch your baby for signs of hunger, not the clock.   Nurse as often as the baby requests, or when you feel the need to reduce the fullness of your breasts.   Awaken the baby if it has been 3 to 4 hours since the last feeding.   Frequent feeding will help the mother make more milk and will prevent problems like sore nipples and engorgement of the breasts.  BABY'S POSITION AT THE BREAST  Whether lying down or sitting, be sure that the baby's tummy is facing your tummy.   Support the breast with 4 fingers underneath the breast and the thumb above. Make sure your fingers are well away from the nipple and baby's mouth.   Stroke the baby's lips and cheek closest to the breast gently with your finger or nipple.   When the baby's mouth is open wide enough, place all of your nipple and as much of the dark area around the nipple  as possible into your baby's mouth.   Pull the baby in close so the tip of the nose and the baby's cheeks touch the breast during the feeding.  FEEDINGS  The length of each feeding varies from baby to baby and from feeding to feeding.   The baby must suck about 2 to 3 minutes for your milk to get to him or her. This is called a "let down." For this reason, allow the baby to feed on each breast as long as he or she wants. Your baby will end the feeding when he or she has received the right balance of nutrients.   To break the suction, put your finger into the corner of the baby's mouth and slide it between his or her gums before removing your breast from his or her mouth. This will help prevent sore nipples.  REDUCING BREAST ENGORGEMENT  In the first week after your baby is born, you may experience signs of breast engorgement. When breasts are engorged, they feel heavy, warm, full, and may   be tender to the touch. You can reduce engorgement if you:   Nurse frequently, every 2 to 3 hours. Mothers who breastfeed early and often have fewer problems with engorgement.   Place light ice packs on your breasts between feedings. This reduces swelling. Wrap the ice packs in a lightweight towel to protect your skin.   Apply moist hot packs to your breast for 5 to 10 minutes before each feeding. This increases circulation and helps the milk flow.   Gently massage your breast before and during the feeding.   Make sure that the baby empties at least one breast at every feeding before switching sides.   Use a breast pump to empty the breasts if your baby is sleepy or not nursing well. You may also want to pump if you are returning to work or or you feel you are getting engorged.   Avoid bottle feeds, pacifiers or supplemental feedings of water or juice in place of breastfeeding.   Be sure the baby is latched on and positioned properly while breastfeeding.   Prevent fatigue, stress, and anemia.   Wear  a supportive bra, avoiding underwire styles.   Eat a balanced diet with enough fluids.  If you follow these suggestions, your engorgement should improve in 24 to 48 hours. If you are still experiencing difficulty, call your lactation consultant or caregiver. IS MY BABY GETTING ENOUGH MILK? Sometimes, mothers worry about whether their babies are getting enough milk. You can be assured that your baby is getting enough milk if:  The baby is actively sucking and you hear swallowing.   The baby nurses at least 8 to 12 times in a 24 hour time period. Nurse your baby until he or she unlatches or falls asleep at the first breast (at least 10 to 20 minutes), then offer the second side.   The baby is wetting 5 to 6 disposable diapers (6 to 8 cloth diapers) in a 24 hour period by 5 to 6 days of age.   The baby is having at least 2 to 3 stools every 24 hours for the first few months. Breast milk is all the food your baby needs. It is not necessary for your baby to have water or formula. In fact, to help your breasts make more milk, it is best not to give your baby supplemental feedings during the early weeks.   The stool should be soft and yellow.   The baby should gain 4 to 7 ounces per week after he is 4 days old.  TAKE CARE OF YOURSELF Take care of your breasts by:  Bathing or showering daily.   Avoiding the use of soaps on your nipples.   Start feedings on your left breast at one feeding and on your right breast at the next feeding.   You will notice an increase in your milk supply 2 to 5 days after delivery. You may feel some discomfort from engorgement, which makes your breasts very firm and often tender. Engorgement "peaks" out within 24 to 48 hours. In the meantime, apply warm moist towels to your breasts for 5 to 10 minutes before feeding. Gentle massage and expression of some milk before feeding will soften your breasts, making it easier for your baby to latch on. Wear a well fitting nursing  bra and air dry your nipples for 10 to 15 minutes after each feeding.   Only use cotton bra pads.   Only use pure lanolin on your nipples after nursing. You   do not need to wash it off before nursing.  Take care of yourself by:   Eating well-balanced meals and nutritious snacks.   Drinking milk, fruit juice, and water to satisfy your thirst (about 8 glasses a day).   Getting plenty of rest.   Increasing calcium in your diet (1200 mg a day).   Avoiding foods that you notice affect the baby in a bad way.  SEEK MEDICAL CARE IF:   You have any questions or difficulty with breastfeeding.   You need help.   You have a hard, red, sore area on your breast, accompanied by a fever of 100.5 F (38.1 C) or more.   Your baby is too sleepy to eat well or is having trouble sleeping.   Your baby is wetting less than 6 diapers per day, by 5 days of age.   Your baby's skin or white part of his or her eyes is more yellow than it was in the hospital.   You feel depressed.  Document Released: 02/20/2005 Document Revised: 11/02/2010 Document Reviewed: 10/05/2008 [employer] Patient Information 2012 [employer]. 

## 2011-04-17 NOTE — Progress Notes (Signed)
C/o on/off pain on pelvic that radiates to lower back. Onset 3 days ago and described as "stabbing" that lasts about 3 minutes. No c/o pressure.

## 2011-04-17 NOTE — Progress Notes (Signed)
Doing well, needs 3 hour today

## 2011-04-18 LAB — GLUCOSE TOLERANCE, 3 HOURS
Glucose Tolerance, 1 hour: 127 mg/dL (ref 70–189)
Glucose Tolerance, 2 hour: 104 mg/dL (ref 70–164)

## 2011-04-19 ENCOUNTER — Telehealth: Payer: Self-pay | Admitting: *Deleted

## 2011-04-19 NOTE — Telephone Encounter (Signed)
Called patient, spoke with female, he stated she had stepped out, left message for him to tell her we are returning her call, please call back

## 2011-04-19 NOTE — Telephone Encounter (Signed)
Pt left message requesting results of GTT.

## 2011-04-20 NOTE — Telephone Encounter (Signed)
Called pt and informed pt of her normal 3hr gtt.  Pt had no further questions and stated understanding.

## 2011-05-03 ENCOUNTER — Encounter: Payer: Self-pay | Admitting: Family Medicine

## 2011-05-03 ENCOUNTER — Ambulatory Visit (INDEPENDENT_AMBULATORY_CARE_PROVIDER_SITE_OTHER): Payer: Medicaid Other | Admitting: Family Medicine

## 2011-05-03 DIAGNOSIS — O0930 Supervision of pregnancy with insufficient antenatal care, unspecified trimester: Secondary | ICD-10-CM

## 2011-05-03 DIAGNOSIS — Z3490 Encounter for supervision of normal pregnancy, unspecified, unspecified trimester: Secondary | ICD-10-CM

## 2011-05-03 LAB — POCT URINALYSIS DIP (DEVICE)
Protein, ur: NEGATIVE mg/dL
Specific Gravity, Urine: 1.015 (ref 1.005–1.030)
Urobilinogen, UA: 0.2 mg/dL (ref 0.0–1.0)

## 2011-05-03 NOTE — Patient Instructions (Signed)
Pregnancy - Third Trimester The third trimester of pregnancy (the last 3 months) is a period of the most rapid growth for you and your baby. The baby approaches a length of 20 inches and a weight of 6 to 10 pounds. The baby is adding on fat and getting ready for life outside your body. While inside, babies have periods of sleeping and waking, suck their thumbs, and hiccups. You can often feel small contractions of the uterus. This is false labor. It is also called Braxton-Hicks contractions. This is like a practice for labor. The usual problems in this stage of pregnancy include more difficulty breathing, swelling of the hands and feet from water retention, and having to urinate more often because of the uterus and baby pressing on your bladder.  PRENATAL EXAMS  Blood work may continue to be done during prenatal exams. These tests are done to check on your health and the probable health of your baby. Blood work is used to follow your blood levels (hemoglobin). Anemia (low hemoglobin) is common during pregnancy. Iron and vitamins are given to help prevent this. You may also continue to be checked for diabetes. Some of the past blood tests may be done again.   The size of the uterus is measured during each visit. This makes sure your baby is growing properly according to your pregnancy dates.   Your blood pressure is checked every prenatal visit. This is to make sure you are not getting toxemia.   Your urine is checked every prenatal visit for infection, diabetes and protein.   Your weight is checked at each visit. This is done to make sure gains are happening at the suggested rate and that you and your baby are growing normally.   Sometimes, an ultrasound is performed to confirm the position and the proper growth and development of the baby. This is a test done that bounces harmless sound waves off the baby so your caregiver can more accurately determine due dates.   Discuss the type of pain  medication and anesthesia you will have during your labor and delivery.   Discuss the possibility and anesthesia if a Cesarean Section might be necessary.   Inform your caregiver if there is any mental or physical violence at home.  Sometimes, a specialized non-stress test, contraction stress test and biophysical profile are done to make sure the baby is not having a problem. Checking the amniotic fluid surrounding the baby is called an amniocentesis. The amniotic fluid is removed by sticking a needle into the belly (abdomen). This is sometimes done near the end of pregnancy if an early delivery is required. In this case, it is done to help make sure the baby's lungs are mature enough for the baby to live outside of the womb. If the lungs are not mature and it is unsafe to deliver the baby, an injection of cortisone medication is given to the mother 1 to 2 days before the delivery. This helps the baby's lungs mature and makes it safer to deliver the baby. CHANGES OCCURING IN THE THIRD TRIMESTER OF PREGNANCY Your body goes through many changes during pregnancy. They vary from person to person. Talk to your caregiver about changes you notice and are concerned about.  During the last trimester, you have probably had an increase in your appetite. It is normal to have cravings for certain foods. This varies from person to person and pregnancy to pregnancy.   You may begin to get stretch marks on your hips,   abdomen, and breasts. These are normal changes in the body during pregnancy. There are no exercises or medications to take which prevent this change.   Constipation may be treated with a stool softener or adding bulk to your diet. Drinking lots of fluids, fiber in vegetables, fruits, and whole grains are helpful.   Exercising is also helpful. If you have been very active up until your pregnancy, most of these activities can be continued during your pregnancy. If you have been less active, it is helpful  to start an exercise program such as walking. Consult your caregiver before starting exercise programs.   Avoid all smoking, alcohol, un-prescribed drugs, herbs and "street drugs" during your pregnancy. These chemicals affect the formation and growth of the baby. Avoid chemicals throughout the pregnancy to ensure the delivery of a healthy infant.   Backache, varicose veins and hemorrhoids may develop or get worse.   You will tire more easily in the third trimester, which is normal.   The baby's movements may be stronger and more often.   You may become short of breath easily.   Your belly button may stick out.   A yellow discharge may leak from your breasts called colostrum.   You may have a bloody mucus discharge. This usually occurs a few days to a week before labor begins.  HOME CARE INSTRUCTIONS   Keep your caregiver's appointments. Follow your caregiver's instructions regarding medication use, exercise, and diet.   During pregnancy, you are providing food for you and your baby. Continue to eat regular, well-balanced meals. Choose foods such as meat, fish, milk and other low fat dairy products, vegetables, fruits, and whole-grain breads and cereals. Your caregiver will tell you of the ideal weight gain.   A physical sexual relationship may be continued throughout pregnancy if there are no other problems such as early (premature) leaking of amniotic fluid from the membranes, vaginal bleeding, or belly (abdominal) pain.   Exercise regularly if there are no restrictions. Check with your caregiver if you are unsure of the safety of your exercises. Greater weight gain will occur in the last 2 trimesters of pregnancy. Exercising helps:   Control your weight.   Get you in shape for labor and delivery.   You lose weight after you deliver.   Rest a lot with legs elevated, or as needed for leg cramps or low back pain.   Wear a good support or jogging bra for breast tenderness during  pregnancy. This may help if worn during sleep. Pads or tissues may be used in the bra if you are leaking colostrum.   Do not use hot tubs, steam rooms, or saunas.   Wear your seat belt when driving. This protects you and your baby if you are in an accident.   Avoid raw meat, cat litter boxes and soil used by cats. These carry germs that can cause birth defects in the baby.   It is easier to loose urine during pregnancy. Tightening up and strengthening the pelvic muscles will help with this problem. You can practice stopping your urination while you are going to the bathroom. These are the same muscles you need to strengthen. It is also the muscles you would use if you were trying to stop from passing gas. You can practice tightening these muscles up 10 times a set and repeating this about 3 times per day. Once you know what muscles to tighten up, do not perform these exercises during urination. It is more likely   to cause an infection by backing up the urine.   Ask for help if you have financial, counseling or nutritional needs during pregnancy. Your caregiver will be able to offer counseling for these needs as well as refer you for other special needs.   Make a list of emergency phone numbers and have them available.   Plan on getting help from family or friends when you go home from the hospital.   Make a trial run to the hospital.   Take prenatal classes with the father to understand, practice and ask questions about the labor and delivery.   Prepare the baby's room/nursery.   Do not travel out of the city unless it is absolutely necessary and with the advice of your caregiver.   Wear only low or no heal shoes to have better balance and prevent falling.  MEDICATIONS AND DRUG USE IN PREGNANCY  Take prenatal vitamins as directed. The vitamin should contain 1 milligram of folic acid. Keep all vitamins out of reach of children. Only a couple vitamins or tablets containing iron may be fatal  to a baby or young child when ingested.   Avoid use of all medications, including herbs, over-the-counter medications, not prescribed or suggested by your caregiver. Only take over-the-counter or prescription medicines for pain, discomfort, or fever as directed by your caregiver. Do not use aspirin, ibuprofen (Motrin, Advil, Nuprin) or naproxen (Aleve) unless OK'd by your caregiver.   Let your caregiver also know about herbs you may be using.   Alcohol is related to a number of birth defects. This includes fetal alcohol syndrome. All alcohol, in any form, should be avoided completely. Smoking will cause low birth rate and premature babies.   Street/illegal drugs are very harmful to the baby. They are absolutely forbidden. A baby born to an addicted mother will be addicted at birth. The baby will go through the same withdrawal an adult does.  SEEK MEDICAL CARE IF: You have any concerns or worries during your pregnancy. It is better to call with your questions if you feel they cannot wait, rather than worry about them. DECISIONS ABOUT CIRCUMCISION You may or may not know the sex of your baby. If you know your baby is a boy, it may be time to think about circumcision. Circumcision is the removal of the foreskin of the penis. This is the skin that covers the sensitive end of the penis. There is no proven medical need for this. Often this decision is made on what is popular at the time or based upon religious beliefs and social issues. You can discuss these issues with your caregiver or pediatrician. SEEK IMMEDIATE MEDICAL CARE IF:   An unexplained oral temperature above 102 F (38.9 C) develops, or as your caregiver suggests.   You have leaking of fluid from the vagina (birth canal). If leaking membranes are suspected, take your temperature and tell your caregiver of this when you call.   There is vaginal spotting, bleeding or passing clots. Tell your caregiver of the amount and how many pads are  used.   You develop a bad smelling vaginal discharge with a change in the color from clear to white.   You develop vomiting that lasts more than 24 hours.   You develop chills or fever.   You develop shortness of breath.   You develop burning on urination.   You loose more than 2 pounds of weight or gain more than 2 pounds of weight or as suggested by your   caregiver.   You notice sudden swelling of your face, hands, and feet or legs.   You develop belly (abdominal) pain. Round ligament discomfort is a common non-cancerous (benign) cause of abdominal pain in pregnancy. Your caregiver still must evaluate you.   You develop a severe headache that does not go away.   You develop visual problems, blurred or double vision.   If you have not felt your baby move for more than 1 hour. If you think the baby is not moving as much as usual, eat something with sugar in it and lie down on your left side for an hour. The baby should move at least 4 to 5 times per hour. Call right away if your baby moves less than that.   You fall, are in a car accident or any kind of trauma.   There is mental or physical violence at home.  Document Released: 02/14/2001 Document Revised: 11/02/2010 Document Reviewed: 08/19/2008 [employer] Patient Information 2012 [employer]. 

## 2011-05-03 NOTE — Progress Notes (Signed)
Pulse- 98 

## 2011-05-03 NOTE — Progress Notes (Signed)
Patient without complaints.  Denies vaginal bleeding, abnormal vaginal discharge, contractions, loss of fluid.  Reports good fetal activity.  Follow up in 1 weeks.  

## 2011-05-26 ENCOUNTER — Inpatient Hospital Stay (HOSPITAL_COMMUNITY)
Admission: AD | Admit: 2011-05-26 | Discharge: 2011-05-27 | Disposition: A | Discharge status: Home / Self Care | Payer: Medicaid Other | Source: Ambulatory Visit | Attending: Obstetrics and Gynecology | Admitting: Obstetrics and Gynecology

## 2011-05-26 DIAGNOSIS — O479 False labor, unspecified: Secondary | ICD-10-CM | POA: Insufficient documentation

## 2011-05-27 ENCOUNTER — Encounter (HOSPITAL_COMMUNITY): Payer: Self-pay | Admitting: *Deleted

## 2011-05-27 DIAGNOSIS — O0930 Supervision of pregnancy with insufficient antenatal care, unspecified trimester: Secondary | ICD-10-CM

## 2011-05-27 NOTE — Discharge Instructions (Signed)
 Normal Labor and Delivery Your caregiver must first be sure you are in labor. Signs of labor include:  You may pass what is called "the mucus plug" before labor begins. This is a small amount of blood stained mucus.   Regular uterine contractions.   The time between contractions get closer together.   The discomfort and pain gradually gets more intense.   Pains are mostly located in the back.   Pains get worse when walking.   The cervix (the opening of the uterus becomes thinner (begins to efface) and opens up (dilates).  Once you are in labor and admitted into the hospital or care center, your caregiver will do the following:  A complete physical examination.   Check your vital signs (blood pressure, pulse, temperature and the fetal heart rate).   Do a vaginal examination (using a sterile glove and lubricant) to determine:   The position (presentation) of the baby (head [vertex] or buttock first).   The level (station) of the baby's head in the birth canal.   The effacement and dilatation of the cervix.   You may have your pubic hair shaved and be given an enema depending on your caregiver and the circumstance.   An electronic monitor is usually placed on your abdomen. The monitor follows the length and intensity of the contractions, as well as the baby's heart rate.   Usually, your caregiver will insert an IV in your arm with a bottle of sugar water. This is done as a precaution so that medications can be given to you quickly during labor or delivery.  NORMAL LABOR AND DELIVERY IS DIVIDED UP INTO 3 STAGES: First Stage This is when regular contractions begin and the cervix begins to efface and dilate. This stage can last from 3 to 15 hours. The end of the first stage is when the cervix is 100% effaced and 10 centimeters dilated. Pain medications may be given by   Injection (morphine, demerol, etc.)   Regional anesthesia (spinal, caudal or epidural, anesthetics given in  different locations of the spine). Paracervical pain medication may be given, which is an injection of and anesthetic on each side of the cervix.  A pregnant woman may request to have "Natural Childbirth" which is not to have any medications or anesthesia during her labor and delivery. Second Stage This is when the baby comes down through the birth canal (vagina) and is born. This can take 1 to 4 hours. As the baby's head comes down through the birth canal, you may feel like you are going to have a bowel movement. You will get the urge to bear down and push until the baby is delivered. As the baby's head is being delivered, the caregiver will decide if an episiotomy (a cut in the perineum and vagina area) is needed to prevent tearing of the tissue in this area. The episiotomy is sewn up after the delivery of the baby and placenta. Sometimes a mask with nitrous oxide is given for the mother to breath during the delivery of the baby to help if there is too much pain. The end of Stage 2 is when the baby is fully delivered. Then when the umbilical cord stops pulsating it is clamped and cut. Third Stage The third stage begins after the baby is completely delivered and ends after the placenta (afterbirth) is delivered. This usually takes 5 to 30 minutes. After the placenta is delivered, a medication is given either by intravenous or injection to help contract  the uterus and prevent bleeding. The third stage is not painful and pain medication is usually not necessary. If an episiotomy was done, it is repaired at this time. After the delivery, the mother is watched and monitored closely for 1 to 2 hours to make sure there is no postpartum bleeding (hemorrhage). If there is a lot of bleeding, medication is given to contract the uterus and stop the bleeding. Document Released: 11/30/2007 Document Revised: 02/09/2011 Document Reviewed: 11/30/2007 [facility] Patient Information 2012 [place], [place].

## 2011-05-27 NOTE — MAU Provider Note (Signed)
History     CSN: [id number]  Arrival date and time: 05/26/11 2327   First Provider Initiated Contact with Patient 05/27/11 0015      No chief complaint on file.  HPI  Tonya Nichols is a 26 y.o. [id number] who presents at [redacted]w[redacted]d with increasing frequency and strength of contractions for 1.5 hours, which last for 30 seconds each. No gush of fluid or vaginal bleeding. Mild white vaginal discharge. + fetal movement. [facility]. Late to prenatal care, starting at 23 weeks. Patient would like Depo as a contraceptive, will breastfeed, pediatrician will be [facility]. Elevated 1 hr GTT (143), normal 3 hr GTT. Largest prior delivery was 9lbs 1 oz. This one feels smaller. All full term and vaginal deliveries.   Past Medical History  Diagnosis Date  . Asthma     as child  . Chlamydia     Past Surgical History  Procedure Date  . No past surgeries     Family History  Problem Relation Age of Onset  . Anesthesia problems Neg Hx   . Alcohol abuse Neg Hx     History  Substance Use Topics  . Smoking status: Current Everyday Smoker -- 0.2 packs/day for 14 years  . Smokeless tobacco: Never Used  . Alcohol Use: No     not with preg    Allergies: No Known Allergies  Prescriptions prior to admission  Medication Sig Dispense Refill  . calcium carbonate (TUMS - DOSED IN MG ELEMENTAL CALCIUM) 500 MG chewable tablet Chew 1 tablet by mouth 2 (two) times daily as needed. For heartburn      . Prenatal Vit-Fe Fumarate-FA (PRENATAL MULTIVITAMIN) TABS Take 1 tablet by mouth daily.        Review of Systems  Constitutional: Negative for fever.  Eyes: Negative for blurred vision.  Respiratory: Negative for shortness of breath.   Gastrointestinal: Positive for abdominal pain (contractions). Negative for nausea and vomiting.  Neurological: Negative for headaches.   Physical Exam   Blood pressure 121/63, pulse 92, temperature 98.5 F (36.9 C), temperature  source Oral, resp. rate 20, height 5\' 1"  (1.549 m), weight 85.548 kg (188 lb 9.6 oz), last menstrual period 08/29/2010.  Physical Exam  Constitutional: She is oriented to person, place, and time. She appears well-developed and well-nourished. No distress.  HENT:  Head: Normocephalic and atraumatic.  Eyes: EOM are normal.  Neck: Normal range of motion.  Cardiovascular: Normal rate.   Respiratory: Effort normal. No respiratory distress.  GI: She exhibits distension (gravid).  Genitourinary: Vagina normal and uterus normal.  Musculoskeletal: Normal range of motion. She exhibits no edema.  Neurological: She is alert and oriented to person, place, and time. No cranial nerve deficit.  Skin: Skin is warm and dry. She is not diaphoretic. No erythema.  Psychiatric: She has a normal mood and affect. Her behavior is normal. Judgment and thought content normal.   FHR: 150s, moderate variability, accels present, no decels Contractions every 2-4 minutes  MAU Course  Procedures Dilation: 1 Effacement (%): 50 Cervical Position: Posterior Station: -2 Presentation: Vertex Exam by:: Dr Daivd Council RNC  00:30 will have patient walk for 1 hour  02:00 Dilation: 1 Effacement (%): 50 Cervical Position: Posterior Station: -2 Presentation: Vertex Exam by:: Dr Daivd Council RNC Unchanged from prior  Assessment and Plan  26 y.o. [id number] who presents at [redacted]w[redacted]d IUP No change in cervical dilation Discharge home with labor precautions  Fawn Kirk  R 05/27/2011, 12:23 AM

## 2011-05-27 NOTE — Progress Notes (Signed)
Pt up to walk with friend. Will return at 0130 or sooner for leaking fld, bleeding, any other concerns

## 2011-05-27 NOTE — Progress Notes (Signed)
Dr Leonor Liv in to see pt. Discussing plan of care-pt to walk and hr and will reck cervix.

## 2011-05-27 NOTE — Progress Notes (Signed)
Written and verbal d/c instructions given and understanding voiced. 

## 2011-05-27 NOTE — Progress Notes (Signed)
Dr Leonor Liv in to see pt. EFM strip reviewed.

## 2011-05-28 NOTE — MAU Provider Note (Signed)
Seen and examined. Agree with note 

## 2011-05-30 ENCOUNTER — Inpatient Hospital Stay (HOSPITAL_COMMUNITY)
Admission: AD | Admit: 2011-05-30 | Discharge: 2011-05-30 | Disposition: A | Discharge status: Home / Self Care | Payer: Medicaid Other | Source: Ambulatory Visit | Attending: Obstetrics & Gynecology | Admitting: Obstetrics & Gynecology

## 2011-05-30 ENCOUNTER — Encounter (HOSPITAL_COMMUNITY): Payer: Self-pay

## 2011-05-30 DIAGNOSIS — O479 False labor, unspecified: Secondary | ICD-10-CM

## 2011-05-30 DIAGNOSIS — Z3490 Encounter for supervision of normal pregnancy, unspecified, unspecified trimester: Secondary | ICD-10-CM

## 2011-05-30 DIAGNOSIS — O0930 Supervision of pregnancy with insufficient antenatal care, unspecified trimester: Secondary | ICD-10-CM

## 2011-05-30 MED ORDER — OXYCODONE-ACETAMINOPHEN 5-325 MG PO TABS
2.0000 | ORAL_TABLET | Freq: Once | ORAL | Status: AC
  Administered 2011-05-30: 2 via ORAL
  Filled 2011-05-30: qty 2

## 2011-05-30 NOTE — MAU Provider Note (Signed)
  History     CSN: [id number]  Arrival date and time: 05/30/11 1400   First Provider Initiated Contact with Patient 05/30/11 1512      No chief complaint on file.  HPI This is a 26 y.o. at [redacted]w[redacted]d who presents for labor eval. Was seen earlier this week for same and cervix remained unchanged at 1cm. Denies leaking or bleeding. + FM.    OB History    Grav Para Term Preterm Abortions TAB SAB Ect Mult Living   6 5 5  0 0 0 0 0 0 5      Past Medical History  Diagnosis Date  . Asthma     as child  . Chlamydia     Past Surgical History  Procedure Date  . No past surgeries     Family History  Problem Relation Age of Onset  . Anesthesia problems Neg Hx   . Alcohol abuse Neg Hx     History  Substance Use Topics  . Smoking status: Current Everyday Smoker -- 0.2 packs/day for 14 years  . Smokeless tobacco: Never Used  . Alcohol Use: No     not with preg    Allergies: No Known Allergies  Prescriptions prior to admission  Medication Sig Dispense Refill  . calcium carbonate (TUMS - DOSED IN MG ELEMENTAL CALCIUM) 500 MG chewable tablet Chew 1 tablet by mouth 2 (two) times daily as needed. For heartburn      . Prenatal Vit-Fe Fumarate-FA (PRENATAL MULTIVITAMIN) TABS Take 1 tablet by mouth at bedtime.         Review of Systems  Constitutional: Negative for fever.  Gastrointestinal: Positive for abdominal pain. Negative for nausea and vomiting.    Physical Exam   Blood pressure 109/68, pulse 99, temperature 98.5 F (36.9 C), temperature source Oral, resp. rate 18, last menstrual period 08/29/2010.  Physical Exam  Constitutional: She is oriented to person, place, and time. She appears well-developed and well-nourished. No distress (Does not appear very uncomfortable).  HENT:  Head: Normocephalic.  Respiratory: Effort normal.  GI: Soft. She exhibits no distension and no mass. There is no tenderness. There is no rebound and no guarding.  Genitourinary: Vagina normal and  uterus normal. No vaginal discharge found.       Cervix loose 1 per RN Dilation: 2 Effacement (%): 70 Cervical Position: Posterior Station: -3 Presentation: Vertex Exam by:: Lucy Chris RNC EFM:  Reactive with irregular contractions  Musculoskeletal: Normal range of motion.  Neurological: She is alert and oriented to person, place, and time.  Skin: Skin is warm and dry.  Psychiatric: She has a normal mood and affect.  No change in cervix after an hour.   MAU Course  Procedures  Assessment and Plan  A:  SIUP at [redacted]w[redacted]d       Prodromal contractions without cervical change P: Discharge home      Labor precautions  [facility] 05/30/2011, 3:13 PM

## 2011-05-30 NOTE — Discharge Instructions (Signed)
Normal Labor and Delivery Your caregiver must first be sure you are in labor. Signs of labor include:  You may pass what is called "the mucus plug" before labor begins. This is a small amount of blood stained mucus.   Regular uterine contractions.   The time between contractions get closer together.   The discomfort and pain gradually gets more intense.   Pains are mostly located in the back.   Pains get worse when walking.   The cervix (the opening of the uterus becomes thinner (begins to efface) and opens up (dilates).  Once you are in labor and admitted into the hospital or care center, your caregiver will do the following:  A complete physical examination.   Check your vital signs (blood pressure, pulse, temperature and the fetal heart rate).   Do a vaginal examination (using a sterile glove and lubricant) to determine:   The position (presentation) of the baby (head [vertex] or buttock first).   The level (station) of the baby's head in the birth canal.   The effacement and dilatation of the cervix.   You may have your pubic hair shaved and be given an enema depending on your caregiver and the circumstance.   An electronic monitor is usually placed on your abdomen. The monitor follows the length and intensity of the contractions, as well as the baby's heart rate.   Usually, your caregiver will insert an IV in your arm with a bottle of sugar water. This is done as a precaution so that medications can be given to you quickly during labor or delivery.  NORMAL LABOR AND DELIVERY IS DIVIDED UP INTO 3 STAGES: First Stage This is when regular contractions begin and the cervix begins to efface and dilate. This stage can last from 3 to 15 hours. The end of the first stage is when the cervix is 100% effaced and 10 centimeters dilated. Pain medications may be given by   Injection (morphine, demerol, etc.)   Regional anesthesia (spinal, caudal or epidural, anesthetics given in  different locations of the spine). Paracervical pain medication may be given, which is an injection of and anesthetic on each side of the cervix.  A pregnant woman may request to have "Natural Childbirth" which is not to have any medications or anesthesia during her labor and delivery. Second Stage This is when the baby comes down through the birth canal (vagina) and is born. This can take 1 to 4 hours. As the baby's head comes down through the birth canal, you may feel like you are going to have a bowel movement. You will get the urge to bear down and push until the baby is delivered. As the baby's head is being delivered, the caregiver will decide if an episiotomy (a cut in the perineum and vagina area) is needed to prevent tearing of the tissue in this area. The episiotomy is sewn up after the delivery of the baby and placenta. Sometimes a mask with nitrous oxide is given for the mother to breath during the delivery of the baby to help if there is too much pain. The end of Stage 2 is when the baby is fully delivered. Then when the umbilical cord stops pulsating it is clamped and cut. Third Stage The third stage begins after the baby is completely delivered and ends after the placenta (afterbirth) is delivered. This usually takes 5 to 30 minutes. After the placenta is delivered, a medication is given either by intravenous or injection to help contract   the uterus and prevent bleeding. The third stage is not painful and pain medication is usually not necessary. If an episiotomy was done, it is repaired at this time. After the delivery, the mother is watched and monitored closely for 1 to 2 hours to make sure there is no postpartum bleeding (hemorrhage). If there is a lot of bleeding, medication is given to contract the uterus and stop the bleeding. Document Released: 11/30/2007 Document Revised: 02/09/2011 Document Reviewed: 11/30/2007 Exit[employer] Patient Information 2012 [employer]. 

## 2011-05-30 NOTE — MAU Note (Signed)
Pt states contractions every 7 minutes since 3am this morning. States has had some pink tinged mucous discharge. Reports positive fetal movement. States was 1cm dilated on 3/23.

## 2011-05-31 ENCOUNTER — Encounter (HOSPITAL_COMMUNITY): Payer: Self-pay | Admitting: *Deleted

## 2011-05-31 ENCOUNTER — Inpatient Hospital Stay (HOSPITAL_COMMUNITY)
Admission: AD | Admit: 2011-05-31 | Discharge: 2011-06-02 | DRG: 775 | Disposition: A | Discharge status: Home / Self Care | Payer: Medicaid Other | Source: Ambulatory Visit | Attending: Family Medicine | Admitting: Family Medicine

## 2011-05-31 ENCOUNTER — Encounter (HOSPITAL_COMMUNITY): Payer: Self-pay | Admitting: Anesthesiology

## 2011-05-31 ENCOUNTER — Inpatient Hospital Stay (HOSPITAL_COMMUNITY): Payer: Medicaid Other | Admitting: Anesthesiology

## 2011-05-31 DIAGNOSIS — IMO0001 Reserved for inherently not codable concepts without codable children: Secondary | ICD-10-CM

## 2011-05-31 LAB — CBC
HCT: 36.4 % (ref 36.0–46.0)
MCH: 29.1 pg (ref 26.0–34.0)
MCHC: 33.8 g/dL (ref 30.0–36.0)
RDW: 14 % (ref 11.5–15.5)

## 2011-05-31 MED ORDER — LACTATED RINGERS IV SOLN
INTRAVENOUS | Status: DC
  Administered 2011-05-31 (×3): via INTRAVENOUS

## 2011-05-31 MED ORDER — PHENYLEPHRINE 40 MCG/ML (10ML) SYRINGE FOR IV PUSH (FOR BLOOD PRESSURE SUPPORT): 80.0000 ug | PREFILLED_SYRINGE | INTRAVENOUS | Status: DC | PRN

## 2011-05-31 MED ORDER — OXYTOCIN 20 UNITS IN LACTATED RINGERS INFUSION - SIMPLE: 125.0000 mL/h | Freq: Once | INTRAVENOUS | Status: DC

## 2011-05-31 MED ORDER — EPHEDRINE 5 MG/ML INJ
10.0000 mg | INTRAVENOUS | Status: DC | PRN
  Administered 2011-05-31: 10 mg via INTRAVENOUS
  Filled 2011-05-31: qty 4

## 2011-05-31 MED ORDER — IBUPROFEN 600 MG PO TABS
600.0000 mg | ORAL_TABLET | Freq: Four times a day (QID) | ORAL | Status: DC | PRN
  Administered 2011-05-31: 600 mg via ORAL
  Filled 2011-05-31: qty 1

## 2011-05-31 MED ORDER — FENTANYL 2.5 MCG/ML BUPIVACAINE 1/10 % EPIDURAL INFUSION (WH - ANES)
INTRAMUSCULAR | Status: DC | PRN
  Administered 2011-05-31: 14 mL/h via EPIDURAL

## 2011-05-31 MED ORDER — FENTANYL 2.5 MCG/ML BUPIVACAINE 1/10 % EPIDURAL INFUSION (WH - ANES)
14.0000 mL/h | INTRAMUSCULAR | Status: DC
  Administered 2011-05-31 (×2): 14 mL/h via EPIDURAL
  Filled 2011-05-31 (×3): qty 60

## 2011-05-31 MED ORDER — DIPHENHYDRAMINE HCL 50 MG/ML IJ SOLN: 12.5000 mg | INTRAMUSCULAR | Status: DC | PRN

## 2011-05-31 MED ORDER — TERBUTALINE SULFATE 1 MG/ML IJ SOLN: 0.2500 mg | Freq: Once | INTRAMUSCULAR | Status: DC | PRN

## 2011-05-31 MED ORDER — LACTATED RINGERS IV SOLN
500.0000 mL | Freq: Once | INTRAVENOUS | Status: AC
  Administered 2011-05-31: 500 mL via INTRAVENOUS

## 2011-05-31 MED ORDER — OXYCODONE-ACETAMINOPHEN 5-325 MG PO TABS
1.0000 | ORAL_TABLET | ORAL | Status: DC | PRN
Start: 2011-05-31 — End: 2011-05-31
  Administered 2011-05-31: 1 via ORAL
  Filled 2011-05-31: qty 1

## 2011-05-31 MED ORDER — BUTORPHANOL TARTRATE 2 MG/ML IJ SOLN
1.0000 mg | INTRAMUSCULAR | Status: DC | PRN
  Administered 2011-05-31: 1 mg via INTRAVENOUS
  Filled 2011-05-31: qty 1

## 2011-05-31 MED ORDER — LIDOCAINE HCL (PF) 1 % IJ SOLN
30.0000 mL | INTRAMUSCULAR | Status: DC | PRN
  Filled 2011-05-31: qty 30

## 2011-05-31 MED ORDER — PHENYLEPHRINE 40 MCG/ML (10ML) SYRINGE FOR IV PUSH (FOR BLOOD PRESSURE SUPPORT)
80.0000 ug | PREFILLED_SYRINGE | INTRAVENOUS | Status: DC | PRN
  Filled 2011-05-31: qty 5

## 2011-05-31 MED ORDER — SODIUM BICARBONATE 8.4 % IV SOLN
INTRAVENOUS | Status: DC | PRN
  Administered 2011-05-31: 4 mL via EPIDURAL

## 2011-05-31 MED ORDER — FLEET ENEMA 7-19 GM/118ML RE ENEM: 1.0000 | ENEMA | RECTAL | Status: DC | PRN

## 2011-05-31 MED ORDER — OXYTOCIN 20 UNITS IN LACTATED RINGERS INFUSION - SIMPLE
1.0000 m[IU]/min | INTRAVENOUS | Status: DC
  Administered 2011-05-31: 1 m[IU]/min via INTRAVENOUS
  Filled 2011-05-31: qty 1000

## 2011-05-31 MED ORDER — CITRIC ACID-SODIUM CITRATE 334-500 MG/5ML PO SOLN: 30.0000 mL | ORAL | Status: DC | PRN

## 2011-05-31 MED ORDER — LACTATED RINGERS IV SOLN
500.0000 mL | INTRAVENOUS | Status: DC | PRN
  Administered 2011-05-31 (×2): 500 mL via INTRAVENOUS

## 2011-05-31 MED ORDER — ONDANSETRON HCL 4 MG/2ML IJ SOLN
4.0000 mg | Freq: Four times a day (QID) | INTRAMUSCULAR | Status: DC | PRN
  Administered 2011-05-31: 4 mg via INTRAVENOUS
  Filled 2011-05-31: qty 2

## 2011-05-31 MED ORDER — OXYTOCIN BOLUS FROM INFUSION
500.0000 mL | Freq: Once | INTRAVENOUS | Status: DC
  Filled 2011-05-31: qty 500
  Filled 2011-05-31: qty 1000

## 2011-05-31 MED ORDER — ACETAMINOPHEN 325 MG PO TABS: 650.0000 mg | ORAL_TABLET | ORAL | Status: DC | PRN

## 2011-05-31 MED ORDER — EPHEDRINE 5 MG/ML INJ: 10.0000 mg | INTRAVENOUS | Status: DC | PRN

## 2011-05-31 NOTE — MAU Note (Addendum)
Arrived via EMS. States she has had contractions since yesterday. Was seen in MAU. States was 2cm.

## 2011-05-31 NOTE — MAU Provider Note (Signed)
  History     CSN: [id number]  Arrival date and time: 05/31/11 4098   First Provider Initiated Contact with Patient 05/31/11 0858    Tonya Nichols is a 26 yo G6P5005 presenting at [redacted]w[redacted]d with contractions.  Chief Complaint  Patient presents with  . Contractions   HPI Tonya Nichols is a 26 yo G6P5005 presenting at [redacted]w[redacted]d with contractions since 5:30am this morning.   The contractions are described as sharp cramping in low back, abdominal tightening, and perineal pressure which has worsened since onset with a present pain rating of 10+/10 per patient.  At onset CTX were 5 mins. apart and are presently 1-2 mins. apart with a duration of approximately 30 sec.  She states the pain is similar to her previous labors.  She has felt baby moving this am.  She denies gush of fluid and vaginal bleeding.  Patient requests epidural or other pain medication.  She also states she is a current tobacco smoker with approximately 5 cig. per day. Her previous labors last about 5 hours with the largest weight at 9lb. 1 oz.  Unknown GBS status.  OB History    Grav Para Term Preterm Abortions TAB SAB Ect Mult Living   6 5 5  0 0 0 0 0 0 5      Past Medical History  Diagnosis Date  . Asthma     as child  . Chlamydia     Past Surgical History  Procedure Date  . No past surgeries     Family History  Problem Relation Age of Onset  . Anesthesia problems Neg Hx   . Alcohol abuse Neg Hx     History  Substance Use Topics  . Smoking status: Current Everyday Smoker -- 0.2 packs/day for 14 years  . Smokeless tobacco: Never Used  . Alcohol Use: No     not with preg    Allergies: No Known Allergies  Prescriptions prior to admission  Medication Sig Dispense Refill  . calcium carbonate (TUMS - DOSED IN MG ELEMENTAL CALCIUM) 500 MG chewable tablet Chew 1 tablet by mouth 2 (two) times daily as needed. For heartburn      . Prenatal Vit-Fe Fumarate-FA (PRENATAL MULTIVITAMIN) TABS Take 1 tablet by mouth at  bedtime.         Review of Systems  Respiratory: Positive for shortness of breath (active labor).   Gastrointestinal: Positive for abdominal pain (ctx ).   Physical Exam   Blood pressure 123/76, pulse 104, temperature 98.2 F (36.8 C), temperature source Oral, resp. rate 20, last menstrual period 08/29/2010.  Physical Exam  Constitutional: She is oriented to person, place, and time. She appears well-developed and well-nourished.  HENT:  Head: Normocephalic and atraumatic.  Neck: Neck supple.  Cardiovascular: Normal rate, regular rhythm and normal heart sounds.   Respiratory: Effort normal and breath sounds normal.  GI: She exhibits distension (gravida).  Genitourinary: Vagina normal.  Neurological: She is alert and oriented to person, place, and time.  Cervix: 3-4/90/-2 vertex  Fetus FHT - 135, Reassuring NST with variability and accelerations noted. No decelerations noted.   MAU Course  Procedures    Assessment and Plan  26 yo G6P5005 presenting at [redacted]w[redacted]d in active labor.  Admit to Labor and Delivery.  Will put in orders for Epidural.  Mardene Speak 05/31/2011, 9:33 AM

## 2011-05-31 NOTE — Anesthesia Procedure Notes (Addendum)
Epidural Patient location during procedure: OB  Preanesthetic Checklist Completed: patient identified, site marked, surgical consent, pre-op evaluation, timeout performed, IV checked, risks and benefits discussed and monitors and equipment checked  Epidural Patient position: sitting Prep: site prepped and draped and DuraPrep Patient monitoring: continuous pulse ox and blood pressure Approach: midline Injection technique: LOR air  Needle:  Needle type: Tuohy  Needle gauge: 17 G Needle length: 9 cm Needle insertion depth: 7 cm Catheter type: closed end flexible Catheter size: 19 Gauge Catheter at skin depth: 14 cm Test dose: negative  Assessment Events: blood not aspirated, injection not painful, no injection resistance, negative IV test and no paresthesia  Additional Notes Dosing of Epidural:  1st dose, through needle ............................................Marland Kitchen epi 1:200K + Xylocaine 40 mg  2nd dose, through needle, after waiting 3 minutes...Marland KitchenMarland Kitchenepi 1:200K + Xylocaine 40 mg; no sx of SAB or motor block  3rd dose, through catheter after waiting 3 minutes .............................Marcaine   4mg    ( mg Marcaine are expressed as equivilent  cc's medication removed from the 0.1%Bupiv / fentanyl syringe from L&D pump)  ( 2% Xylo charted as a single dose in Epic Meds for ease of charting; actual dosing was fractionated as above, for saftey's sake)  As each dose occurred, patient was free of IV sx; and patient exhibited no evidence of SA injection.  Patient is more comfortable after epidural dosed. Please see RN's note for documentation of vital signs,and FHR which are stable.

## 2011-05-31 NOTE — H&P (Addendum)
Tonya Nichols is a 26 y.o. female G6P5005 at [redacted]w[redacted]d presenting for contractions. Maternal Medical History:  Reason for admission: Reason for admission: contractions.  Contractions: Onset was 3-5 hours ago.   Frequency: regular.   Duration is approximately 30 seconds.   Perceived severity is strong.    Fetal activity: Perceived fetal activity is normal.   Last perceived fetal movement was within the past hour.    Prenatal complications: Tobacco use - 5 cig. per day  Prenatal Complications - Diabetes: none.    OB History    Grav Para Term Preterm Abortions TAB SAB Ect Mult Living   6 5 5  0 0 0 0 0 0 5     Past Medical History  Diagnosis Date  . Asthma     as child  . Chlamydia    Past Surgical History  Procedure Date  . No past surgeries    Family History: family history is negative for Anesthesia problems and Alcohol abuse. Social History:  reports that she has been smoking.  She has never used smokeless tobacco. She reports that she does not drink alcohol or use illicit drugs.  Review of Systems  Gastrointestinal: Positive for abdominal pain (contractions).  Musculoskeletal: Positive for back pain (low back pain with contractions).    Dilation: 3.5 Effacement (%): 90 Station: -2 Exam by:: L. L-Kirby, CNM Blood pressure 115/59, pulse 92, temperature 98.2 F (36.8 C), temperature source Oral, resp. rate 20, last menstrual period 08/29/2010. Maternal Exam:  Uterine Assessment: Contraction strength is moderate.  Contraction duration is 40 seconds. Contraction frequency is regular.   Abdomen: Patient reports no abdominal tenderness. Fetal presentation: vertex  Pelvis: adequate for delivery.   Cervix: Cervix evaluated by digital exam.     Fetal Exam Fetal Monitor Review: Mode: ultrasound.   Baseline rate: 135.  Variability: moderate (6-25 bpm).   Pattern: accelerations present and no decelerations.    Fetal State Assessment: Category I - tracings are  normal.     Physical Exam  Constitutional: She is oriented to person, place, and time. She appears well-developed and well-nourished.  HENT:  Head: Normocephalic and atraumatic.  Neck: Neck supple.  Cardiovascular: Normal rate, regular rhythm and normal heart sounds.   Respiratory: Effort normal and breath sounds normal.  GI: She exhibits distension (gravida).  Neurological: She is alert and oriented to person, place, and time.    Prenatal labs: ABO, Rh: --/--/A NEG (01/29 1620) Antibody: NEG (01/30 1135) Rubella: 61.0 (01/29 1620) RPR: NON REACTIVE (01/29 1620)  HBsAg: NEGATIVE (01/29 1620)  HIV: NON REACTIVE (01/29 1620)  GBS:   Unknown - last prenatal visit at 35 weeks  Assessment/Plan: 26 y.o. female G6P5005 at [redacted]w[redacted]d in active labor.  Admit to L & D.  GBS Unknown, but no abx at term. Epidural if desired. Ancipitate SVD.    Mardene Speak 05/31/2011, 9:49 AM   I have seen this patient and agree with the above PA student's H&P and the MAU note.  In addition, cervical check 1/80/-2 upon arrival to MAU with change to 3.5/80/-2 in 30 minutes.  LEFTWICH-KIRBY, Yaviel Kloster Certified Nurse-Midwife

## 2011-05-31 NOTE — Progress Notes (Signed)
Tonya Nichols is a 26 y.o. [id number] at [redacted]w[redacted]d admitted for active labor.  Subjective: Pt comfortable now with epidural.  Her father is in the room for labor support.   Objective: BP 121/71  Pulse 116  Temp(Src) 97.9 F (36.6 C) (Oral)  Resp 20  Ht 5\' 1"  (1.549 m)  Wt 85.276 kg (188 lb)  BMI 35.52 kg/m2  SpO2 98%  LMP 08/29/2010      FHT:  FHR: 135 bpm, variability: moderate,  accelerations:  Present,  decelerations:  Present Prolonged deceleration x1 down to 90 lasting 3 minutes following SROM.  Pt positioned on her side, with oxygen.  UC:   regular, every 2-3 minutes SVE:   Dilation: 7.5 Effacement (%): 90 Station: -1 Exam by:: CNM  SROM Significant bright red bleeding with clots noted following SROM   Labs: Lab Results  Component Value Date   WBC 18.3* 05/31/2011   HGB 12.3 05/31/2011   HCT 36.4 05/31/2011   MCV 86.3 05/31/2011   PLT 133* 05/31/2011    Assessment / Plan: Active labor Discussed vaginal bleeding with Dr Shawnie Pons, will continue to monitor  Fetal Wellbeing:  Category II Pain Control:  Epidural I/D:  n/a Anticipated MOD:  NSVD   LEFTWICH-KIRBY, Cathryn Gallery 05/31/2011, 11:53 AM

## 2011-05-31 NOTE — H&P (Signed)
Chart reviewed and agree with management and plan.  

## 2011-05-31 NOTE — Anesthesia Preprocedure Evaluation (Signed)

## 2011-05-31 NOTE — Progress Notes (Signed)
   Loeta L Agramonte is a 26 y.o. [id number] at [redacted]w[redacted]d  admitted for active labor  Subjective: Comfortable with epidural.  NO pressure  Objective: BP 121/68  Pulse 112  Temp(Src) 99.6 F (37.6 C) (Oral)  Resp 18  Ht 5\' 1"  (1.549 m)  Wt 188 lb (85.276 kg)  BMI 35.52 kg/m2  SpO2 98%  LMP 08/29/2010    FHT:  FHR: 140 bpm, variability: moderate,  accelerations:  Present,  decelerations:  Absent UC:   regular, every 3 minutes SVE:   Dilation: 10 Effacement (%): 100 Station: +2 Exam by:: [place]  Labs: Lab Results  Component Value Date   WBC 18.3* 05/31/2011   HGB 12.3 05/31/2011   HCT 36.4 05/31/2011   MCV 86.3 05/31/2011   PLT 133* 05/31/2011    Assessment / Plan: beginning 2nd stage' pt wants to wait to push when she has pressure  Labor: Progressing normally Fetal Wellbeing:  Category I Pain Control:  Epidural Anticipated MOD:  NSVD  CRESENZO-DISHMAN,Charisa Twitty 05/31/2011, 7:36 PM

## 2011-05-31 NOTE — MAU Provider Note (Signed)
Chart reviewed and agree with management and plan.  

## 2011-06-01 MED ORDER — BENZOCAINE-MENTHOL 20-0.5 % EX AERO
1.0000 "application " | INHALATION_SPRAY | CUTANEOUS | Status: DC | PRN
  Administered 2011-06-01: 1 via TOPICAL

## 2011-06-01 MED ORDER — TETANUS-DIPHTH-ACELL PERTUSSIS 5-2.5-18.5 LF-MCG/0.5 IM SUSP
0.5000 mL | Freq: Once | INTRAMUSCULAR | Status: AC
  Administered 2011-06-01: 0.5 mL via INTRAMUSCULAR
  Filled 2011-06-01: qty 0.5

## 2011-06-01 MED ORDER — SENNOSIDES-DOCUSATE SODIUM 8.6-50 MG PO TABS
2.0000 | ORAL_TABLET | Freq: Every day | ORAL | Status: DC
  Administered 2011-06-01: 2 via ORAL

## 2011-06-01 MED ORDER — ONDANSETRON HCL 4 MG/2ML IJ SOLN: 4.0000 mg | INTRAMUSCULAR | Status: DC | PRN

## 2011-06-01 MED ORDER — IBUPROFEN 600 MG PO TABS
600.0000 mg | ORAL_TABLET | Freq: Four times a day (QID) | ORAL | Status: DC
  Administered 2011-06-01 – 2011-06-02 (×5): 600 mg via ORAL
  Filled 2011-06-01 (×5): qty 1

## 2011-06-01 MED ORDER — LANOLIN HYDROUS EX OINT: TOPICAL_OINTMENT | CUTANEOUS | Status: DC | PRN

## 2011-06-01 MED ORDER — PRENATAL MULTIVITAMIN CH
1.0000 | ORAL_TABLET | Freq: Every day | ORAL | Status: DC
  Administered 2011-06-01 – 2011-06-02 (×2): 1 via ORAL
  Filled 2011-06-01 (×2): qty 1

## 2011-06-01 MED ORDER — SIMETHICONE 80 MG PO CHEW: 80.0000 mg | CHEWABLE_TABLET | ORAL | Status: DC | PRN

## 2011-06-01 MED ORDER — DIPHENHYDRAMINE HCL 25 MG PO CAPS: 25.0000 mg | ORAL_CAPSULE | Freq: Four times a day (QID) | ORAL | Status: DC | PRN

## 2011-06-01 MED ORDER — BENZOCAINE-MENTHOL 20-0.5 % EX AERO
INHALATION_SPRAY | CUTANEOUS | Status: AC
  Administered 2011-06-01: 1 via TOPICAL
  Filled 2011-06-01: qty 56

## 2011-06-01 MED ORDER — ZOLPIDEM TARTRATE 5 MG PO TABS: 5.0000 mg | ORAL_TABLET | Freq: Every evening | ORAL | Status: DC | PRN

## 2011-06-01 MED ORDER — RHO D IMMUNE GLOBULIN 1500 UNIT/2ML IJ SOLN
300.0000 ug | Freq: Once | INTRAMUSCULAR | Status: AC
  Administered 2011-06-01: 300 ug via INTRAMUSCULAR
  Filled 2011-06-01: qty 2

## 2011-06-01 MED ORDER — ONDANSETRON HCL 4 MG PO TABS: 4.0000 mg | ORAL_TABLET | ORAL | Status: DC | PRN

## 2011-06-01 MED ORDER — WITCH HAZEL-GLYCERIN EX PADS: 1.0000 "application " | MEDICATED_PAD | CUTANEOUS | Status: DC | PRN

## 2011-06-01 MED ORDER — OXYCODONE-ACETAMINOPHEN 5-325 MG PO TABS
1.0000 | ORAL_TABLET | ORAL | Status: DC | PRN
  Administered 2011-06-01 – 2011-06-02 (×6): 1 via ORAL
  Filled 2011-06-01 (×6): qty 1

## 2011-06-01 MED ORDER — DIBUCAINE 1 % RE OINT: 1.0000 "application " | TOPICAL_OINTMENT | RECTAL | Status: DC | PRN

## 2011-06-01 NOTE — Progress Notes (Signed)
UR Chart review completed.  

## 2011-06-01 NOTE — Anesthesia Postprocedure Evaluation (Signed)
  Anesthesia Post-op Note  Patient: Tonya Nichols  Procedure(s) Performed: * No procedures listed *  Patient Location: 109  Anesthesia Type: Epidural  Level of Consciousness: awake, alert  and oriented  Airway and Oxygen Therapy: Patient Spontanous Breathing  Post-op Pain: mild  Post-op Assessment: Post-op Vital signs reviewed, Patient's Cardiovascular Status Stable, No headache, No backache, No residual numbness and No residual motor weakness  Post-op Vital Signs: Reviewed and stable  Complications: No apparent anesthesia complications

## 2011-06-01 NOTE — Progress Notes (Deleted)
Post Partum Day #1  Subjective: no complaints, up ad lib, voiding, tolerating PO and + flatus Breastfeeding well. Ambulating with no problems. Pain controlled with Motrin.   Objective: Blood pressure 92/51, pulse 69, temperature 97.8 F (36.6 C), temperature source Oral, resp. rate 18, height 5\' 1"  (1.549 m), weight 85.276 kg (188 lb), last menstrual period 08/29/2010, SpO2 97.00%, unknown if currently breastfeeding.  Physical Exam:  General: alert, cooperative and no distress Lochia: appropriate Uterine Fundus: firm Incision: N/A DVT Evaluation: No evidence of DVT seen on physical exam.   Basename 05/31/11 1001  HGB 12.3  HCT 36.4    Assessment/Plan: Plan for discharge tomorrow, Breastfeeding, Lactation consult and Contraception Depo Outpatient circumcision. GBS unknown therefore baby boy will be monitored by Pediatrician for 48 hours prior to discharge. Will follow up in [facility].   LOS: 1 day   HAIRFORD, AMBER 06/01/2011, 7:23 AM    Saw pt and agree.Juni Glaab 10:24 AM 06/01/2011

## 2011-06-01 NOTE — Progress Notes (Signed)
PPD#1 Note by Dr.  Mikel Cella deleted in error) S: No c/o. Voiding qs. Pain well controlled. ): Filed Vitals:   06/01/11 0330  BP: 92/51  Pulse: 69  Temp: 97.8 F (36.6 C)  Resp: 18   Gen: NAD Breasts: lactational Lochia: appropriate Fundus: firm below u DVT Evaluation: no evidence DVT  Hgb 12.3 on 05/31/11 GBS unknown  A/P: Continue breastfeeding and see LC. Depo for contraception. D/C am

## 2011-06-01 NOTE — Progress Notes (Signed)
Clinical Social Work Department  PSYCHOSOCIAL ASSESSMENT - MATERNAL/CHILD  06/01/2011  Patient: Tonya Nichols,Tonya Nichols Account Number: [id number] Admit Date: 05/31/2011  Childs Name:   Tonya Nichols or   Tonya Nichols   Clinical Social Worker: Michol Emory, LCSWA Date/Time: 06/01/2011 12:00 M  Date Referred: 06/01/2011   Referral source   CN   Referred reason   Substance Abuse   Other referral source:  I: FAMILY / HOME ENVIRONMENT  Child's legal guardian: PARENT    Guardian - Name  Guardian - Age  Guardian - Address    Kyleena Iannuzzi  28  [address] [place], [place] [address]    William Nichols  39  (same as above)    Other household support members/support persons     Name  Relationship  DOB      DAUGHTER  13      DAUGHTER  11      DAUGHTER  10      DAUGHTER  8      DAUGHTER  4     Other support:      Pt's father      II PSYCHOSOCIAL DATA  Information Source: Patient Interview  Financial and Community Resources  Employment:  Financial resources: Medicaid  If Medicaid - County: [place]  School / Grade:  Maternity Care Coordinator / Child Services Coordination / Early Interventions: Cultural issues impacting care:  III STRENGTHS      Strengths      Adequate Resources      Supportive family/friends      Home prepared for Child (including basic supplies)      Strength comment:  IV RISK FACTORS AND CURRENT PROBLEMS  Current Problem: YES      Risk Factor & Current Problem  Patient Issue  Family Issue  Risk Factor / Current Problem Comment      Substance Abuse  Y  N  Marijuana       N  N       V SOCIAL WORK ASSESSMENT       Pt denies MJ use prior to pregnancy confirmation at 5 months. As result of "pregnancy sickness," she started to smoke MJ to help with symptoms. She admits to smoking MJ, "2-3 times a week," during pregnancy. She last used, "one week or 3 days ago." She denies other illegal substance use. UDS is negative, meconium is pending. Pt verbalized understanding of  hospital drug testing policy. She has past involvement with CPS. She reports having all the necessary supplies for the infant and adequate support. Sw will follow up with drug screen results and make a referral if needed.       VI SOCIAL WORK PLAN       Social Work Plan       No Further Intervention Required / No Barriers to Discharge       Type of pt/family education:  If child protective services report - county:  If child protective services report - date:  Information/referral to community resources comment:  Other social work plan:           

## 2011-06-02 LAB — RH IG WORKUP (INCLUDES ABO/RH): Gestational Age(Wks): 39

## 2011-06-02 MED ORDER — IBUPROFEN 600 MG PO TABS: 600.0000 mg | ORAL_TABLET | Freq: Four times a day (QID) | ORAL | Status: AC

## 2011-06-02 MED ORDER — OXYCODONE-ACETAMINOPHEN 5-325 MG PO TABS: 1.0000 | ORAL_TABLET | ORAL | Status: AC | PRN

## 2011-06-02 NOTE — Discharge Summary (Signed)
Obstetric Discharge Summary Reason for Admission: onset of labor Prenatal Procedures: ultrasound Intrapartum Procedures: spontaneous vaginal delivery Postpartum Procedures: none Complications-Operative and Postpartum: none Hemoglobin  Date Value Range Status  05/31/2011 12.3  12.0-15.0 (g/dL) Final     HCT  Date Value Range Status  05/31/2011 36.4  36.0-46.0 (%) Final    Physical Exam:  General: alert, cooperative and no distress CV: RRR Pulm: CTAB Lochia: appropriate Uterine Fundus: firm DVT Evaluation: No evidence of DVT seen on physical exam.  Discharge Diagnoses: Term Pregnancy-delivered  Discharge Information: Date: 06/02/2011 Activity: pelvic rest Diet: routine Medications: PNV and Ibuprofen Condition: stable Instructions: refer to practice specific booklet Discharge to: home Follow-up Information    Schedule an appointment as soon as possible for a visit with [facility].         Newborn Data: Live born female  Birth Weight: 6 lb 10.2 oz (3011 g) APGAR: 8, 9  Home with mother.  CHAMBERLAIN,RACHEL 06/02/2011, 7:46 AM  I have seen this patient and agree with the above resident's note.  LEFTWICH-KIRBY, Enrica Corliss Certified Nurse-Midwife

## 2011-06-02 NOTE — Discharge Instructions (Signed)
Vaginal Delivery Care After  Change your pad on each trip to the bathroom.   Wipe gently with toilet paper during your hospital stay. Always wipe from front to back. A spray bottle with warm tap water could also be used or a towelette if available.   Place your soiled pad and toilet paper in a bathroom wastebasket with a plastic bag liner.   During your hospital stay, save any clots. If you pass a clot while on the toilet, do not flush it. Also, if your vaginal flow seems excessive to you, notify nursing personnel.   The first time you get out of bed after delivery, wait for assistance from a nurse. Do not get up alone at any time if you feel weak or dizzy.   Bend and extend your ankles forcefully so that you feel the calves of your legs get hard. Do this 6 times every hour when you are in bed and awake.   Do not sit with one foot under you, dangle your legs over the edge of the bed, or maintain a position that hinders the circulation in your legs.   Many women experience after pains for 2 to 3 days after delivery. These after pains are mild uterine contractions. Ask the nurse for a pain medication if you need something for this. Sometimes breastfeeding stimulates after pains; if you find this to be true, ask for the medication  -  hour before the next feeding.   For you and your infant's protection, do not go beyond the door(s) of the obstetric unit. Do not carry your baby in your arms in the hallway. When taking your baby to and from your room, put your baby in the bassinet and push the bassinet.   Mothers may have their babies in their room as much as they desire.  Document Released: 02/18/2000 Document Revised: 02/09/2011 Document Reviewed: 01/18/2007 Exit[employer] Patient Information 2012 [employer]. 

## 2011-06-06 ENCOUNTER — Encounter: Payer: Self-pay | Admitting: Advanced Practice Midwife

## 2011-06-08 ENCOUNTER — Ambulatory Visit: Payer: Medicaid Other

## 2011-06-29 ENCOUNTER — Ambulatory Visit: Payer: Medicaid Other | Admitting: Advanced Practice Midwife

## 2011-07-19 ENCOUNTER — Ambulatory Visit (INDEPENDENT_AMBULATORY_CARE_PROVIDER_SITE_OTHER): Payer: Medicaid Other | Admitting: Advanced Practice Midwife

## 2011-07-19 ENCOUNTER — Encounter: Payer: Self-pay | Admitting: Advanced Practice Midwife

## 2011-07-19 VITALS — BP 104/65 | HR 74 | Temp 97.7°F | Ht 62.0 in | Wt 173.0 lb

## 2011-07-19 DIAGNOSIS — O99345 Other mental disorders complicating the puerperium: Secondary | ICD-10-CM

## 2011-07-19 DIAGNOSIS — Z309 Encounter for contraceptive management, unspecified: Secondary | ICD-10-CM

## 2011-07-19 DIAGNOSIS — F53 Postpartum depression: Secondary | ICD-10-CM

## 2011-07-19 MED ORDER — SERTRALINE HCL 25 MG PO TABS: 25.0000 mg | ORAL_TABLET | Freq: Every day | ORAL | Status: DC

## 2011-07-20 ENCOUNTER — Encounter: Payer: Self-pay | Admitting: Advanced Practice Midwife

## 2011-07-20 DIAGNOSIS — F53 Postpartum depression: Secondary | ICD-10-CM | POA: Insufficient documentation

## 2011-07-20 HISTORY — DX: Postpartum depression: F53.0

## 2011-07-20 NOTE — Progress Notes (Signed)
  Subjective:     Tonya Nichols is a 26 y.o. female who presents for a postpartum visit. She is 7 weeks postpartum following a spontaneous vaginal delivery. I have fully reviewed the prenatal and intrapartum course. The delivery was at 39.2 gestational weeks. Outcome: spontaneous vaginal delivery. Anesthesia: epidural. Postpartum course has been complicated by postpartum depression. Baby's course has been normal. Baby is feeding by bottle -  . Bleeding no bleeding. Bowel function is normal. Bladder function is normal. Patient is sexually active w/out protection. She does not recall how long ago last IC was Contraception method is none. Postpartum depression screening: positive. No SI/HI  The following portions of the patient's history were reviewed and updated as appropriate: allergies, current medications, past family history, past medical history, past social history, past surgical history and problem list.  Review of Systems Pertinent items are noted in HPI.   Objective:    BP 104/65  Pulse 74  Temp(Src) 97.7 F (36.5 C) (Oral)  Ht 5\' 2"  (1.575 m)  Wt 78.472 kg (173 lb)  BMI 31.64 kg/m2  Breastfeeding? No  General:  alert, cooperative and tearful, anxious   Breasts:  deferrred  Lungs: clear to auscultation bilaterally  Heart:  regular rate and rhythm, S1, S2 normal, no murmur, click, rub or gallop  Abdomen: soft, non-tender; bowel sounds normal; no masses,  no organomegaly   Vulva:  not evaluated  Vagina: not evaluated  Cervix:  not evaluated  Corpus: below SP  Adnexa:  not evaluated  Rectal Exam: Not performed.        Assessment:   1. Post partum depression  sertraline (ZOLOFT) 25 MG tablet, TSH  2. Routine postpartum follow-up      Plan:    1. Contraception: Plans Depo-Provera injections. Instructed to abstain x 2 weeks and return for Depo. Interested in BTL. Consent signed today. Will verify length of Pregnancy Medicaid coverage. Not interested in IUDs, Nexplanon. 2. Rx  Zoloft 3. Follow up in: 5 weeks for PP Depression check or as needed.  4. Support given. Discussed coping methods. 5. SW called, unavailable. Pt states she is not interested in counceling/mental health services at this time. SW will call pt to give list of providers in case Sx worsen/needs change.    [place], [place]

## 2011-07-28 ENCOUNTER — Telehealth: Payer: Self-pay

## 2011-07-28 NOTE — Telephone Encounter (Signed)
Called pt and left message to return call to the clinics. Re: Pt needs to come in to re-sign BTL papers.  Pt has an appt on 08/02/11 for Depo provera.

## 2011-08-02 ENCOUNTER — Ambulatory Visit: Payer: Medicaid Other

## 2011-08-03 NOTE — Telephone Encounter (Signed)
Called home number and heard message number is not valid,called mobile number left message we need to speak with you, please call clinic

## 2011-08-03 NOTE — Telephone Encounter (Signed)
Pt did not show for her depo injection on 08/02/11.

## 2011-08-04 ENCOUNTER — Encounter: Payer: Self-pay | Admitting: *Deleted

## 2011-08-04 NOTE — Telephone Encounter (Signed)
I called patients number and was unable to leave a message.

## 2011-08-04 NOTE — Telephone Encounter (Signed)
Letter Typed and Sent to Patient via certified mail.

## 2011-08-23 ENCOUNTER — Ambulatory Visit: Payer: Medicaid Other | Admitting: Advanced Practice Midwife

## 2012-09-10 ENCOUNTER — Emergency Department: Payer: Self-pay | Admitting: Unknown Physician Specialty

## 2012-09-10 LAB — COMPREHENSIVE METABOLIC PANEL
Alkaline Phosphatase: 73 U/L (ref 50–136)
Anion Gap: 11 (ref 7–16)
Bilirubin,Total: 0.2 mg/dL (ref 0.2–1.0)
Calcium, Total: 9.6 mg/dL (ref 8.5–10.1)
Co2: 20 mmol/L — ABNORMAL LOW (ref 21–32)
Glucose: 87 mg/dL (ref 65–99)
Potassium: 4 mmol/L (ref 3.5–5.1)
SGPT (ALT): 20 U/L (ref 12–78)
Sodium: 136 mmol/L (ref 136–145)

## 2012-09-10 LAB — URINALYSIS, COMPLETE
Bilirubin,UR: NEGATIVE
Blood: NEGATIVE
Glucose,UR: NEGATIVE mg/dL (ref 0–75)
Leukocyte Esterase: NEGATIVE
Nitrite: NEGATIVE
Specific Gravity: 1.024 (ref 1.003–1.030)
WBC UR: 2 /HPF (ref 0–5)

## 2012-09-10 LAB — CBC
HCT: 39.9 % (ref 35.0–47.0)
HGB: 13.6 g/dL (ref 12.0–16.0)
MCH: 28.9 pg (ref 26.0–34.0)
Platelet: 177 10*3/uL (ref 150–440)

## 2012-11-28 ENCOUNTER — Ambulatory Visit: Payer: Self-pay | Admitting: Family Medicine

## 2013-03-23 ENCOUNTER — Observation Stay: Payer: Self-pay | Admitting: Obstetrics and Gynecology

## 2013-04-03 ENCOUNTER — Observation Stay: Payer: Self-pay

## 2013-04-05 ENCOUNTER — Observation Stay: Payer: Self-pay | Admitting: Obstetrics and Gynecology

## 2013-04-06 ENCOUNTER — Inpatient Hospital Stay: Payer: Self-pay | Admitting: Obstetrics & Gynecology

## 2013-04-06 LAB — CBC WITH DIFFERENTIAL/PLATELET
BASOS ABS: 0 10*3/uL (ref 0.0–0.1)
BASOS PCT: 0.2 %
Eosinophil #: 0.1 10*3/uL (ref 0.0–0.7)
Eosinophil %: 0.5 %
HCT: 35.8 % (ref 35.0–47.0)
HGB: 11.9 g/dL — ABNORMAL LOW (ref 12.0–16.0)
LYMPHS PCT: 14.9 %
Lymphocyte #: 2 10*3/uL (ref 1.0–3.6)
MCH: 27.4 pg (ref 26.0–34.0)
MCHC: 33.3 g/dL (ref 32.0–36.0)
MCV: 82 fL (ref 80–100)
Monocyte #: 0.9 x10 3/mm (ref 0.2–0.9)
Monocyte %: 6.9 %
Neutrophil #: 10.6 10*3/uL — ABNORMAL HIGH (ref 1.4–6.5)
Neutrophil %: 77.5 %
Platelet: 154 10*3/uL (ref 150–440)
RBC: 4.35 10*6/uL (ref 3.80–5.20)
RDW: 15 % — AB (ref 11.5–14.5)
WBC: 13.6 10*3/uL — ABNORMAL HIGH (ref 3.6–11.0)

## 2013-04-07 LAB — HEMATOCRIT: HCT: 31.8 % — AB (ref 35.0–47.0)

## 2013-04-07 LAB — DRUG SCREEN, URINE
Amphetamines, Ur Screen: NEGATIVE (ref ?–1000)
BARBITURATES, UR SCREEN: NEGATIVE (ref ?–200)
Benzodiazepine, Ur Scrn: NEGATIVE (ref ?–200)
COCAINE METABOLITE, UR ~~LOC~~: NEGATIVE (ref ?–300)
Cannabinoid 50 Ng, Ur ~~LOC~~: POSITIVE (ref ?–50)
MDMA (Ecstasy)Ur Screen: NEGATIVE (ref ?–500)
METHADONE, UR SCREEN: NEGATIVE (ref ?–300)
Opiate, Ur Screen: POSITIVE (ref ?–300)
PHENCYCLIDINE (PCP) UR S: NEGATIVE (ref ?–25)
Tricyclic, Ur Screen: NEGATIVE (ref ?–1000)

## 2013-04-07 LAB — GC/CHLAMYDIA PROBE AMP

## 2014-01-05 ENCOUNTER — Encounter: Payer: Self-pay | Admitting: Advanced Practice Midwife

## 2014-03-06 NOTE — L&D Delivery Note (Signed)
Deliver Note   Date of Delivery:   10/02/2014 Primary OB:   [facility] Gestational Age/EDD: [redacted]w[redacted]d by 10/03/2014, Alternate EDD Entry  Antepartum complications:  OB History    Gravida Para Term Preterm AB TAB SAB Ectopic Multiple Living   0 0 0 0 0 0 7      Delivered By:   Vena Austria MD  Delivery Type:   TSVD Anesthesia:     none  Intrapartum complications:  GBS:     Unkown Laceration:     none Episiotomy:    none Placenta:    Spontaneous Estimated Blood Loss:   Baby:    Liveborn female ,  APGAR (1 MIN): 8 APGAR (5 MINS): 9 weight  pending   Deliver Details   A  female was delivered via  TSVD(Presentation:OA  ).  APGAR: 8 ,9 ; weight  pending Placenta status: spontaneous, intact .  Cord: 3VC  with the following complications: light meconium .    Mom to postpartum.  Baby to Couplet care / Skin to Skin.

## 2014-04-28 LAB — OB RESULTS CONSOLE RPR: RPR: NONREACTIVE

## 2014-04-28 LAB — OB RESULTS CONSOLE HEPATITIS B SURFACE ANTIGEN: Hepatitis B Surface Ag: NEGATIVE

## 2014-04-28 LAB — OB RESULTS CONSOLE HIV ANTIBODY (ROUTINE TESTING): HIV: NONREACTIVE

## 2014-04-28 LAB — OB RESULTS CONSOLE VARICELLA ZOSTER ANTIBODY, IGG: Varicella: IMMUNE

## 2014-04-28 LAB — OB RESULTS CONSOLE ABO/RH: RH TYPE: NEGATIVE

## 2014-04-28 LAB — OB RESULTS CONSOLE RUBELLA ANTIBODY, IGM: Rubella: IMMUNE

## 2014-04-28 LAB — HM PAP SMEAR: HM Pap smear: NEGATIVE

## 2014-05-14 ENCOUNTER — Encounter: Payer: Self-pay | Admitting: Obstetrics & Gynecology

## 2014-06-19 ENCOUNTER — Other Ambulatory Visit: Payer: Self-pay | Admitting: Obstetrics & Gynecology

## 2014-06-19 DIAGNOSIS — IMO0002 Reserved for concepts with insufficient information to code with codable children: Secondary | ICD-10-CM

## 2014-06-19 DIAGNOSIS — Z0489 Encounter for examination and observation for other specified reasons: Secondary | ICD-10-CM

## 2014-07-14 NOTE — H&P (Signed)
L&D Evaluation:  History Expanded:  HPI 29 yo G7P6006 at 39 weeks of pregnancy, [facility] patient, who has had very poor attendance of her pNV, she has a hx of MJ in her urine this pregnancy she has a hx of etoh and tob, she got the tdap on 11/6, she is RH neg. she presenta tonight with contractions. they are evry 3 minutes she states, and wants top be induced tonight. SHe is VI/RUBI/GBS neg.   Gravida 7   Term 6   PreTerm 0   Abortion 0   Living 6   Blood Type (Maternal) A negative   Group B Strep Results Maternal (Result >5wks must be treated as unknown) negative   Maternal HIV Negative   Maternal Syphilis Ab Nonreactive   Maternal Varicella Immune   Rubella Results (Maternal) immune   Maternal T-Dap Immune   [facility]   Presents with contractions   Patient's Medical History No Chronic Illness   Patient's Surgical History none   Medications Pre Natal Vitamins   Allergies NKDA   Social History tobacco  EtOH  drugs   Family History Non-Contributory   Current Prenatal Course Notable For poor prenstal care   ROS:  ROS All systems were reviewed.  HEENT, CNS, GI, GU, Respiratory, CV, Renal and Musculoskeletal systems were found to be normal.   Exam:  Vital Signs stable   Urine Protein not completed   General no apparent distress   Mental Status clear   Chest clear   Heart normal sinus rhythm   Abdomen gravid, non-tender   Estimated Fetal Weight Average for gestational age   Fetal Position v   Fundal Height term   Back no CVAT   Edema no edema   Reflexes 1+   Pelvic no external lesions, 1cm same as office   Mebranes Intact   FHT normal rate with no decels, CAT 1   Fetal Heart Rate 140   Ucx irregular, about every 25 minutes   Skin dry   Lymph no lymphadenopathy   Impression:  Impression early labor   Plan:  Plan monitor contractions and for cervical change   Follow Up Appointment need to schedule. in 1 week    Electronic Signatures: Adria DevonKlett, Jazmynn Pho (MD)  (Signed 18-Jan-15 22:04)  Authored: L&D Evaluation   Last Updated: 18-Jan-15 22:04 by Adria DevonKlett, Naomy Esham (MD)

## 2014-07-14 NOTE — H&P (Signed)
L&D Evaluation:  History Expanded:  HPI 29 yo G7P6006 at [redacted]w[redacted]d gestational age by LMP consistent with 22 week ultrasound.  Pregnancy complicated by grand multiparity, tobacco use (though stopped), numerous missed prenatal visits, and abnormal pap smear with ASCUS, HPV + during pregnancy.   She is also Rh negative and received rhogam on 01/09/13. [facility] She was sent to L&D for an NST due to dates.  She notes positive fetal movement, no leakage of fluid, no vaginal bleeding.  She was seen on L&D two days ago and her cervix was dilated to 3cm.  She also has a positive drug screen this pregnancy for cannabanoids.   Gravida 7   Term 6   PreTerm 0   Abortion 0   Living 6   Blood Type (Maternal) A negative   Group B Strep Results Maternal (Result >5wks must be treated as unknown) negative   Maternal HIV Negative   Maternal Syphilis Ab Nonreactive   Maternal Varicella Immune   Rubella Results (Maternal) immune   EDC 29-Mar-2013   Exam:  Vital Signs normotensive   General no apparent distress   Abdomen gravid, non-tender   Estimated Fetal Weight Average for gestational age   Edema no edema   Pelvic no external lesions, 3/75/-2   Mebranes Intact   FHT normal rate with no decels   FHT Description 120/mod var/+accels/no decels   Ucx irregular, 2 contractions q 10 min   Impression:  Impression 1) Intrauterine pregnancy at 853w0d, 2) grand multiplarity, 3) reactive NST, 4) no evidence of active labor   Plan:  Comments 1) labor: gave patient option to walk around as she has had no cervical change over 2 hours.  Discussed augmentation/induction given gestational age.  She declines today and she has been schedule for Tuesday 2/3.  She desired discharge prior to recheck. 2) Fetal well being: reassuing with reactive NST showing baseline FHR of 120, no decelerations, +accelerations, moderate variability. 3) GBS neg 4) Dispo: home  with strict precautions to return should stronger contractions occur or any other concerning symptom (bleeding, gush of fluid, decreased fetal movement).   Electronic Signatures: Conard NovakJackson, Stephen D (MD)  (Signed 31-Jan-15 13:08)  Authored: L&D Evaluation   Last Updated: 31-Jan-15 13:08 by Conard NovakJackson, Stephen D (MD)

## 2014-07-14 NOTE — H&P (Signed)
L&D Evaluation:  History Expanded:  HPI 29 yo G7P6006 at 6036w1d gestational age by LMP consistent with 22 week ultrasound.  Pregnancy complicated by grand multiparity, tobacco use (though stopped), numerous missed prenatal visits, and abnormal pap smear with ASCUS, HPV + during pregnancy.   She is also Rh negative and received rhogam on 01/09/13. [facility]. She presents with contractions that have worsened over the past couple of hours. She notes positive fetal movement, no leakage of fluid, no vaginal bleeding.  She was seen on L&D yesterday and her cervix was dilated to 3cm.  She also has a positive drug screen this pregnancy for cannabanoids.   Gravida 7   Term 6   PreTerm 0   Abortion 0   Living 6   Blood Type (Maternal) A negative   Group B Strep Results Maternal (Result >5wks must be treated as unknown) negative   Maternal HIV Negative   Maternal Syphilis Ab Nonreactive   Maternal Varicella Immune   Rubella Results (Maternal) immune   [facility]   Patient's Medical History No Chronic Illness   Patient's Surgical History none   Medications Pre Natal Vitamins  ranitidine   Allergies NKDA   Social History Smokes 1/2 ppd, occasional marijuana use, denies EtOH use   Family History Non-Contributory   ROS:  ROS All systems were reviewed.  HEENT, CNS, GI, GU, Respiratory, CV, Renal and Musculoskeletal systems were found to be normal., unless notherwise noted in HPI   Exam:  Vital Signs stable  normotensive, afebrile   General no apparent distress   Mental Status clear   Chest clear   Heart normal sinus rhythm   Abdomen gravid, non-tender   Estimated Fetal Weight Average for gestational age   Fetal Position cephalic   Back no CVAT   Edema no edema   Pelvic no external lesions, 4 cm per RN (up from 3 cm yesterday)   Mebranes Intact   FHT normal rate with no decels   FHT Description 135/mod  var/+accels/no decels   Ucx irregular, 3-4 contractions q 10 min   Impression:  Impression 1) Intrauterine pregnancy at 6836w1d, 2) grand multiplarity, 3) labor   Plan:  Comments 1) admit for labor 2) Fetal well being: reassuing with reactive NST  3) GBS neg 4) expectant management.   Electronic Signatures: Conard NovakJackson, Calhoun Reichardt D (MD)  (Signed 01-Feb-15 19:41)  Authored: L&D Evaluation   Last Updated: 01-Feb-15 19:41 by Conard NovakJackson, Karolyne Timmons D (MD)

## 2014-07-14 NOTE — H&P (Signed)
L&D Evaluation:  History:  HPI 29 yo G7P6006 at 40.4 weeks of pregnancy, [facility] patient, who has had very poor attendance of her PNV, she has a hx of MJ in her urine this pregnancy she has a hx of etoh and tob. She presents tonight with contractions that started earlier in the day and have continued. She reports +FM, denies vb or lof.  She is A-/VI/RUBI/GBS neg. and recieved her TDaP this pregnancy.   Presents with contractions   Patient's Medical History No Chronic Illness   Patient's Surgical History none   Medications Pre Natal Vitamins   Allergies NKDA   Social History tobacco  EtOH  drugs   Family History Non-Contributory   ROS:  ROS All systems were reviewed.  HEENT, CNS, GI, GU, Respiratory, CV, Renal and Musculoskeletal systems were found to be normal.   Exam:  Vital Signs stable   Urine Protein not completed   General no apparent distress   Mental Status clear   Chest clear   Heart normal sinus rhythm   Abdomen gravid, non-tender   Estimated Fetal Weight Average for gestational age   Fetal Position v   Fundal Height term   Back no CVAT   Edema no edema   Reflexes 1+   Pelvic no external lesions, 3/50/-2   Mebranes Intact   FHT normal rate with no decels, CAT 1   Fetal Heart Rate 135   Ucx regular, every 2-4 minutes, mild, patient sleeping through them   Skin dry, no lesions, no rashes   Lymph no lymphadenopathy   Impression:  Impression early labor, IUP at 40.5 weeks, possible latent phase labor   Plan:  Plan EFM/NST, monitor contractions and for cervical change, discharge, ambulate, if unchanged and reactive FHT pt may be discharged with labor precautions.   Follow Up Appointment need to schedule. in 1 week. Pt has NST scheduled in L&D on Saturday   Electronic Signatures: Jannet MantisSubudhi, Lennyn Bellanca (CNM)  (Signed 29-Jan-15 04:41)  Authored: L&D Evaluation   Last Updated: 29-Jan-15 04:41 by Jannet MantisSubudhi, Luisdaniel Kenton (CNM)

## 2014-07-23 ENCOUNTER — Ambulatory Visit: Payer: Medicaid Other

## 2014-08-30 ENCOUNTER — Observation Stay
Admission: EM | Admit: 2014-08-30 | Discharge: 2014-08-31 | Disposition: A | Discharge status: Home / Self Care | Payer: Medicaid Other | Attending: Obstetrics and Gynecology | Admitting: Obstetrics and Gynecology

## 2014-08-30 DIAGNOSIS — O26893 Other specified pregnancy related conditions, third trimester: Secondary | ICD-10-CM | POA: Diagnosis not present

## 2014-08-30 DIAGNOSIS — R109 Unspecified abdominal pain: Secondary | ICD-10-CM | POA: Diagnosis present

## 2014-08-30 DIAGNOSIS — O4700 False labor before 37 completed weeks of gestation, unspecified trimester: Secondary | ICD-10-CM | POA: Diagnosis present

## 2014-08-30 DIAGNOSIS — N390 Urinary tract infection, site not specified: Secondary | ICD-10-CM | POA: Diagnosis present

## 2014-08-30 DIAGNOSIS — Z3A35 35 weeks gestation of pregnancy: Secondary | ICD-10-CM | POA: Diagnosis not present

## 2014-08-30 DIAGNOSIS — R87620 Atypical squamous cells of undetermined significance on cytologic smear of vagina (ASC-US): Secondary | ICD-10-CM | POA: Diagnosis present

## 2014-08-30 DIAGNOSIS — F1010 Alcohol abuse, uncomplicated: Secondary | ICD-10-CM | POA: Diagnosis present

## 2014-08-30 DIAGNOSIS — F1210 Cannabis abuse, uncomplicated: Secondary | ICD-10-CM | POA: Diagnosis present

## 2014-08-30 DIAGNOSIS — O99320 Drug use complicating pregnancy, unspecified trimester: Secondary | ICD-10-CM | POA: Diagnosis present

## 2014-08-30 DIAGNOSIS — Z641 Problems related to multiparity: Secondary | ICD-10-CM

## 2014-08-30 DIAGNOSIS — F1490 Cocaine use, unspecified, uncomplicated: Secondary | ICD-10-CM

## 2014-08-30 DIAGNOSIS — O26899 Other specified pregnancy related conditions, unspecified trimester: Secondary | ICD-10-CM

## 2014-08-30 DIAGNOSIS — O0930 Supervision of pregnancy with insufficient antenatal care, unspecified trimester: Secondary | ICD-10-CM

## 2014-08-30 DIAGNOSIS — O479 False labor, unspecified: Secondary | ICD-10-CM | POA: Diagnosis present

## 2014-08-30 HISTORY — DX: Atypical squamous cells of undetermined significance on cytologic smear of vagina (ASC-US): R87.620

## 2014-08-30 HISTORY — DX: Supervision of pregnancy with insufficient antenatal care, unspecified trimester: O09.30

## 2014-08-30 HISTORY — DX: Cocaine use, unspecified, uncomplicated: F14.90

## 2014-08-30 HISTORY — DX: Cannabis abuse, uncomplicated: F12.10

## 2014-08-30 HISTORY — DX: Urinary tract infection, site not specified: N39.0

## 2014-08-30 HISTORY — DX: Personal history of other infectious and parasitic diseases: Z86.19

## 2014-08-30 HISTORY — DX: Alcohol abuse, uncomplicated: F10.10

## 2014-08-30 HISTORY — DX: Gonococcal infection, unspecified: A54.9

## 2014-08-30 HISTORY — DX: Problems related to multiparity: Z64.1

## 2014-08-30 HISTORY — DX: Drug use complicating pregnancy, unspecified trimester: O99.320

## 2014-08-30 LAB — TYPE AND SCREEN
ABO/RH(D): A NEG
Antibody Screen: POSITIVE

## 2014-08-30 LAB — URINALYSIS COMPLETE WITH MICROSCOPIC (ARMC ONLY)
Bilirubin Urine: NEGATIVE
GLUCOSE, UA: NEGATIVE mg/dL
NITRITE: POSITIVE — AB
PH: 6 (ref 5.0–8.0)
PROTEIN: 100 mg/dL — AB
SPECIFIC GRAVITY, URINE: 1.01 (ref 1.005–1.030)
TRANS EPITHEL UA: 1

## 2014-08-30 LAB — CBC
HEMATOCRIT: 32.5 % — AB (ref 35.0–47.0)
HEMOGLOBIN: 10.8 g/dL — AB (ref 12.0–16.0)
MCH: 29 pg (ref 26.0–34.0)
MCHC: 33.2 g/dL (ref 32.0–36.0)
MCV: 87.4 fL (ref 80.0–100.0)
Platelets: 153 10*3/uL (ref 150–440)
RBC: 3.72 MIL/uL — ABNORMAL LOW (ref 3.80–5.20)
RDW: 14.5 % (ref 11.5–14.5)
WBC: 12.6 10*3/uL — ABNORMAL HIGH (ref 3.6–11.0)

## 2014-08-30 LAB — URINE DRUG SCREEN, QUALITATIVE (ARMC ONLY)
AMPHETAMINES, UR SCREEN: NOT DETECTED
Barbiturates, Ur Screen: NOT DETECTED
Benzodiazepine, Ur Scrn: NOT DETECTED
CANNABINOID 50 NG, UR ~~LOC~~: NOT DETECTED
Cocaine Metabolite,Ur ~~LOC~~: POSITIVE — AB
MDMA (ECSTASY) UR SCREEN: NOT DETECTED
METHADONE SCREEN, URINE: NOT DETECTED
Opiate, Ur Screen: NOT DETECTED
Phencyclidine (PCP) Ur S: NOT DETECTED
Tricyclic, Ur Screen: NOT DETECTED

## 2014-08-30 LAB — CHLAMYDIA/NGC RT PCR (ARMC ONLY)
Chlamydia Tr: NOT DETECTED
N gonorrhoeae: NOT DETECTED

## 2014-08-30 LAB — RAPID HIV SCREEN (HIV 1/2 AB+AG)
HIV 1/2 ANTIBODIES: NONREACTIVE
HIV-1 P24 ANTIGEN - HIV24: NONREACTIVE

## 2014-08-30 MED ORDER — BETAMETHASONE SOD PHOS & ACET 6 (3-3) MG/ML IJ SUSP
INTRAMUSCULAR | Status: AC
  Administered 2014-08-30: 12.5 mg via INTRAMUSCULAR
  Filled 2014-08-30: qty 1

## 2014-08-30 MED ORDER — AMPICILLIN SODIUM 2 G IJ SOLR
2.0000 g | Freq: Once | INTRAMUSCULAR | Status: AC
  Administered 2014-08-30: 2 g via INTRAVENOUS

## 2014-08-30 MED ORDER — ACETAMINOPHEN 325 MG PO TABS
ORAL_TABLET | ORAL | Status: AC
  Administered 2014-08-30: 650 mg via ORAL
  Filled 2014-08-30: qty 2

## 2014-08-30 MED ORDER — SODIUM CHLORIDE 0.9 % IV SOLN
INTRAVENOUS | Status: AC
  Administered 2014-08-30: 2 g via INTRAVENOUS
  Filled 2014-08-30: qty 2000

## 2014-08-30 MED ORDER — AMPICILLIN SODIUM 1 G IJ SOLR
INTRAMUSCULAR | Status: AC
  Administered 2014-08-30: 1 g via INTRAVENOUS
  Filled 2014-08-30: qty 1000

## 2014-08-30 MED ORDER — BETAMETHASONE SOD PHOS & ACET 6 (3-3) MG/ML IJ SUSP
12.5000 mg | Freq: Once | INTRAMUSCULAR | Status: AC
  Administered 2014-08-30: 12.5 mg via INTRAMUSCULAR

## 2014-08-30 MED ORDER — TERBUTALINE SULFATE 1 MG/ML IJ SOLN
0.2500 mg | INTRAMUSCULAR | Status: DC | PRN
  Administered 2014-08-30 (×2): 0.25 mg via SUBCUTANEOUS

## 2014-08-30 MED ORDER — SODIUM CHLORIDE 0.9 % IV SOLN
1.0000 g | INTRAVENOUS | Status: DC
  Administered 2014-08-30 – 2014-08-31 (×4): 1 g via INTRAVENOUS

## 2014-08-30 MED ORDER — LACTATED RINGERS IV SOLN
500.0000 mL | INTRAVENOUS | Status: DC | PRN
  Administered 2014-08-30: 1000 mL via INTRAVENOUS

## 2014-08-30 MED ORDER — TERBUTALINE SULFATE 1 MG/ML IJ SOLN
INTRAMUSCULAR | Status: AC
  Administered 2014-08-30: 0.25 mg via SUBCUTANEOUS
  Filled 2014-08-30: qty 1

## 2014-08-30 MED ORDER — ACETAMINOPHEN 325 MG PO TABS
650.0000 mg | ORAL_TABLET | Freq: Four times a day (QID) | ORAL | Status: DC | PRN
  Administered 2014-08-30: 650 mg via ORAL

## 2014-08-30 MED ORDER — LACTATED RINGERS IV SOLN
INTRAVENOUS | Status: DC
  Administered 2014-08-30: 125 mL/h via INTRAVENOUS

## 2014-08-30 NOTE — Progress Notes (Signed)
Pt assisted to bathroom and void.  Denies pain at this time.

## 2014-08-30 NOTE — OB Triage Note (Signed)
Patient arrives with C/O contractions. Denies LOF or bleeding. Reports started at 12: 30 today.

## 2014-08-30 NOTE — Plan of Care (Addendum)
Patient up to BR for CCMS UA. Instructed how to use towlettes. Reports she is still feeling she is feeling contractions although nothing is graphing. Reposition upon return to bed. Patient states that she now is having R flank pain "from lying in bed on her R side. Readjust toco & [place]. Loyola Mast, RN

## 2014-08-30 NOTE — H&P (Signed)
Obstetric History and Physical  Tonya Nichols is a 29 y.o. [id number] with IUP at [redacted]w[redacted]d by a [redacted]w[redacted]d u/s presenting for contractions and right sided flank pain. The patient reports that she started having pain today around 11am. She denies any vaginal bleeding, vaginal discharge, or urinary symptoms.    Prenatal Course Source of Care: [facility]  with onset of care at [redacted]w[redacted]d weeks Pregnancy complications or risks:  The patients prenatal course is complicated by late entry to care, insufficient prenatal care with only 3 prenatal visits, marijuana use, cocaine use, gonorrhea and chlamydia in the past. She was seen by [facility] for a family history of gastroscesis in March and it was negative. She did not show up for a f/u scan.   Patient Active Problem List   Diagnosis Date Noted  . Preterm uterine contractions 08/30/2014  . Abdominal pain affecting pregnancy 08/30/2014  . UTI (lower urinary tract infection)   . Insufficient prenatal care   . Alcohol abuse   . 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 Vaginal Pap smear with ASC-US   . Cocaine use complicating pregnancy   . Marijuana abuse   . Post partum depression 07/20/2011    She desires bilateral tubal ligation for postpartum contraception.   Prenatal labs and studies: ABO, Rh: A/Negative/-- (02/23 0000) Antibody:   Rubella: Immune (02/23 0000) Varicella: immune  RPR: Nonreactive (02/23 0000)  HBsAg: Negative (02/23 0000)  HIV: Non-reactive (02/23 0000)  GBS:  unknown  1 hr Glucola  Not done  Genetic screening not done Anatomy US normal Tdap: not given this pregnancy- given has last pregnancy in 2015 Flu: NA   Past Medical History  Diagnosis Date  . Asthma     as child  . Gonorrhea   . UTI (lower urinary tract infection)   . 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 Insufficient prenatal care   . Vaginal Pap smear with ASC-US   . History of HPV infection   . Alcohol abuse   . Chlamydia   . Chlamydia   . Cocaine use complicating pregnancy   . Marijuana  abuse     Past Surgical History  Procedure Laterality Date  . No past surgeries      OB History  Gravida Para Term Preterm AB SAB TAB Ectopic Multiple Living  0 0 0 0 0 0 7    # Outcome Date GA Lbr Len/2nd Weight Sex Delivery Anes PTL Lv  8 Current           7 Term 04/06/13    F Vag-Spont   Y  6 Term 05/31/11 [redacted]w[redacted]d 12:01 / 01:56 3.011 kg (6 lb 10.2 oz) M Vag-Spont EPI  Y  5 Term 07/30/06    Conception Chancy Y  4 Term 11/14/02    Conception Chancy Y  3 Term 02/12/01    Conception Chancy Y  2 Term 03/20/00    Conception Chancy Y  1 Term 07/16/97    F Vag-Spont  N Y      History   Social History  . Marital Status: Single    Spouse Name: N/A  . Number of Children: N/A  . Years of Education: N/A   Social History Main Topics  . Smoking status: Current Every Day Smoker -- 0.50 packs/day for 14 years  . Smokeless tobacco: Never Used  . Alcohol Use: Yes     Comment: occassionally   . Drug Use: Yes  Special: Cocaine, Marijuana     Comment: cocaine x1 last week. Has only been 1 time in pregnancy   . Sexual Activity: Yes    Birth Control/ Protection: None   Other Topics Concern  . None   Social History Narrative    Family History  Problem Relation Age of Onset  . Anesthesia problems Neg Hx   . Alcohol abuse Neg Hx     Prescriptions prior to admission  Medication Sig Dispense Refill Last Dose  . Prenatal Vit-Fe Fumarate-FA (PRENATAL MULTIVITAMIN) TABS Take 1 tablet by mouth at bedtime.    08/30/2014 at Unknown time  . sertraline (ZOLOFT) 25 MG tablet Take 1 tablet (25 mg total) by mouth daily. 60 tablet 2     No Known Allergies  Review of Systems: Negative except for what is mentioned in HPI.  Physical Exam: BP 103/44 mmHg  Pulse 100  Temp(Src) 98.1 F (36.7 C) (Oral)  Resp 20  Ht 5\' 3"  (1.6 m)  Wt 83.462 kg (184 lb)  BMI 32.60 kg/m2  LMP 12/27/2013 GENERAL: Well-developed, well-nourished female in no acute distress.  LUNGS: Clear to auscultation  bilaterally.  HEART: Regular rate and rhythm. ABDOMEN: Soft, nontender, nondistended, gravid. EXTREMITIES: Nontender, no edema, 2+ distal pulses. Dilation: 2.5 Effacement (%): 50 Cervical Position: Middle Station: -3 Presentation: Vertex Exam by:: C. Subuhdi, CNM Presentation: cephalic FHT:  Baseline rate 145 bpm   Variability moderate  Accelerations present   Decelerations none Contractions: irregularly Dilation: 2.5 Effacement (%): 50 Cervical Position: Middle Station: -3 Presentation: Vertex Exam by:: C. Subuhdi, CNM Tight 3cm on exam    Pertinent Labs/Studies:   Results for orders placed or performed during the hospital encounter of 08/30/14 (from the past 24 hour(s))  Chlamydia/NGC rt PCR ([facility] only)     Status: None   Collection Time: 08/30/14  2:56 PM  Result Value Ref Range   Specimen source GC/Chlam URINE, RANDOM    Chlamydia Tr NOT DETECTED NOT DETECTED   N gonorrhoeae NOT DETECTED NOT DETECTED  Urine Drug Screen, Qualitative ([facility] only)     Status: Abnormal   Collection Time: 08/30/14  3:55 PM  Result Value Ref Range   Tricyclic, Ur Screen NONE DETECTED NONE DETECTED   Amphetamines, Ur Screen NONE DETECTED NONE DETECTED   MDMA (Ecstasy)Ur Screen NONE DETECTED NONE DETECTED   Cocaine Metabolite,Ur [place] POSITIVE (A) NONE DETECTED   Opiate, Ur Screen NONE DETECTED NONE DETECTED   Phencyclidine (PCP) Ur S NONE DETECTED NONE DETECTED   Cannabinoid 50 Ng, Ur [place] NONE DETECTED NONE DETECTED   Barbiturates, Ur Screen NONE DETECTED NONE DETECTED   Benzodiazepine, Ur Scrn NONE DETECTED NONE DETECTED   Methadone Scn, Ur NONE DETECTED NONE DETECTED  Urinalysis complete, with microscopic ([facility] only)     Status: Abnormal   Collection Time: 08/30/14  3:55 PM  Result Value Ref Range   Color, Urine YELLOW (A) YELLOW   APPearance CLOUDY (A) CLEAR   Glucose, UA NEGATIVE NEGATIVE mg/dL   Bilirubin Urine NEGATIVE NEGATIVE   Ketones, ur 1+ (A) NEGATIVE mg/dL   Specific Gravity,  Urine 1.010 1.005 - 1.030   Hgb urine dipstick 2+ (A) NEGATIVE   pH 6.0 5.0 - 8.0   Protein, ur 100 (A) NEGATIVE mg/dL   Nitrite POSITIVE (A) NEGATIVE   Leukocytes, UA 3+ (A) NEGATIVE   RBC / HPF TOO NUMEROUS TO COUNT 0 - 5 RBC/hpf   WBC, UA TOO NUMEROUS TO COUNT 0 - 5 WBC/hpf   Bacteria,  UA MANY (A) NONE SEEN   Squamous Epithelial / LPF 0-5 (A) NONE SEEN   Trans Epithel, UA 1    WBC Clumps PRESENT     Assessment : Tonya Nichols is a 29 y.o. [id number] at [redacted]w[redacted]d being observed for contractions UTI  Plan: Labor: Expectant management.   UDS performed- +cocaine FWB: Reassuring fetal heart tracing. Cat 1 GBS unknown- swab collected. Will start abx for GBS status and UTI- ampicillin  Urine culture sent Betamethasone x1 given at 1830 Dispo: pending contractions and cervical change- will monitor tonight Plan discussed with Dr. Jean Rosenthal who is in agreement.  Jannet Mantis, CNM [facility]

## 2014-08-30 NOTE — Plan of Care (Signed)
C. Subudhi, CNM to room to discuss lab results UTI and + cocaine use. Patient admits to use X 1 week ago. Discuss risk to infant. Patient verbalized understanding. Plan for antibiotics and Betamethasone injection. Patient agrees with plan. Loyola Mast, RN

## 2014-08-30 NOTE — Procedures (Signed)
Bedside ultrasound performed at bedside for determination of fetal presentation.  Patient consent was obtained for procedure.  Transabdominal approach taken.  Fetal presentation is cephalic with spine on maternal right.  Fluid subjectively normal.  Placenta posterior.  It does not appear to be low lying or in a previa location.   Detailed anatomy not performed, as this was done at [redacted] weeks gestation. Results reviewed with patient and care team. Performed by Conard Novak, MD

## 2014-08-30 NOTE — Plan of Care (Signed)
CMN C. Subudhi to room for update. Pt sleeping, will continue to monitor. Waiting on lab results. Loyola Mast, RN

## 2014-08-31 ENCOUNTER — Ambulatory Visit
Admission: RE | Admit: 2014-08-31 | Discharge: 2014-08-31 | Disposition: A | Discharge status: Home / Self Care | Payer: Medicaid Other | Source: Ambulatory Visit | Attending: Obstetrics & Gynecology | Admitting: Obstetrics & Gynecology

## 2014-08-31 ENCOUNTER — Encounter: Payer: Self-pay | Admitting: Anesthesiology

## 2014-08-31 DIAGNOSIS — Z0489 Encounter for examination and observation for other specified reasons: Secondary | ICD-10-CM

## 2014-08-31 DIAGNOSIS — IMO0002 Reserved for concepts with insufficient information to code with codable children: Secondary | ICD-10-CM

## 2014-08-31 DIAGNOSIS — O26893 Other specified pregnancy related conditions, third trimester: Secondary | ICD-10-CM | POA: Diagnosis not present

## 2014-08-31 LAB — ABO/RH: ABO/RH(D): A NEG

## 2014-08-31 MED ORDER — SODIUM CHLORIDE 0.9 % IV SOLN
INTRAVENOUS | Status: AC
  Administered 2014-08-31: 1 g via INTRAVENOUS
  Filled 2014-08-31: qty 1000

## 2014-08-31 MED ORDER — CALCIUM CARBONATE ANTACID 500 MG PO CHEW
200.0000 mg | CHEWABLE_TABLET | Freq: Two times a day (BID) | ORAL | Status: DC | PRN
  Administered 2014-08-31: 200 mg via ORAL

## 2014-08-31 MED ORDER — AMPICILLIN SODIUM 1 G IJ SOLR
INTRAMUSCULAR | Status: AC
  Administered 2014-08-31: 1 g via INTRAVENOUS
  Filled 2014-08-31: qty 1000

## 2014-08-31 MED ORDER — BETAMETHASONE SOD PHOS & ACET 6 (3-3) MG/ML IJ SUSP
INTRAMUSCULAR | Status: AC
  Administered 2014-08-31: 12.5 mg via INTRAMUSCULAR
  Filled 2014-08-31: qty 1

## 2014-08-31 MED ORDER — BETAMETHASONE SOD PHOS & ACET 6 (3-3) MG/ML IJ SUSP
12.5000 mg | Freq: Once | INTRAMUSCULAR | Status: AC
  Administered 2014-08-31: 12.5 mg via INTRAMUSCULAR

## 2014-08-31 MED ORDER — CALCIUM CARBONATE ANTACID 500 MG PO CHEW
CHEWABLE_TABLET | ORAL | Status: AC
  Administered 2014-08-31: 200 mg via ORAL
  Filled 2014-08-31: qty 1

## 2014-08-31 MED ORDER — FAMOTIDINE 20 MG PO TABS: 20.0000 mg | ORAL_TABLET | Freq: Two times a day (BID) | ORAL | Status: DC | PRN

## 2014-08-31 NOTE — Progress Notes (Deleted)
S:" I am sure I don't want any more children." O: 29 year old patient who just delivered her third baby, desires BTL. She has signed her 30 day Medicaid papers in April. I have counseled her on the permanence and failure rates of BTL. Discussed how the procedure is done. And risks of bleeding, infection, injury to other abdominal organs or blood vessels  and risk of anesthesia. Questions were answered A: Desires BTL P: Schedule for surgery tomorrow with Dr Elesa MassedWard NPO after midnight  Tonya Nichols, CNM

## 2014-08-31 NOTE — Progress Notes (Signed)
Follow up fetal growth due to family history of gastroschisis and substance exposure.    Ultrasound today demonstrates a single, live, intrauterine fetus at 35 weeks 2 days.  Dating is by LMP [facility]; measurments were consistent with 19 weeks 5 days.   today's scan as she has not had a prior ultrasound.  The visualized fetal anatomy appears normal. The fetus is cephalic. The EFW is 2392g (23rd percentile)The amniotic fluid volume is normal and there is no placenta previa.  The findings were discussed.

## 2014-08-31 NOTE — Progress Notes (Signed)
Pt has rested most of the night.  Denies pain at present time., IV infusing.

## 2014-09-01 LAB — URINE CULTURE
Culture: >=100000
Special Requests: NORMAL

## 2014-09-01 LAB — RPR: RPR: NONREACTIVE

## 2014-09-01 NOTE — Progress Notes (Signed)
S: Slept off and on thru night. Contractions irregular and mild. Declines cervical exam. Has appt at [facility] at 1430 today. Reports receiving a dose of Rhogam at [facility] last week.  O: 103/53 Afebrile UA with TNTC WBC and RBC and positive nitrites Antibody screen positive for anti-D Has received a couple of doses of Ampicillin FHR 125-130 baseline with accels to 150s, moderate variability Toco: 2 contractions in the last hour, some periods of uterine irritability  A: IUP at 35 wk1d with preterm labor-contractions have essentially stopped UTI probably the cause of #1 +UDS for cocaine (admits to use last week) Positive anti D from recent Rhogam  P: continue Ampicillin, will give RX for Keflex prior to discharge this afternoon Urine culture pending Social services consult Will give second dose of BMZ just after noon DC home in time for DP aPPT AT 1430. FU as scheduled at [facility]

## 2014-09-02 LAB — CULTURE, BETA STREP (GROUP B ONLY)

## 2014-10-02 ENCOUNTER — Inpatient Hospital Stay
Admission: RE | Admit: 2014-10-02 | Discharge: 2014-10-04 | DRG: 775 | Disposition: A | Discharge status: Home / Self Care | Payer: Medicaid Other | Attending: Obstetrics and Gynecology | Admitting: Obstetrics and Gynecology

## 2014-10-02 DIAGNOSIS — F329 Major depressive disorder, single episode, unspecified: Secondary | ICD-10-CM | POA: Diagnosis present

## 2014-10-02 DIAGNOSIS — Z3A39 39 weeks gestation of pregnancy: Secondary | ICD-10-CM | POA: Diagnosis present

## 2014-10-02 DIAGNOSIS — O99324 Drug use complicating childbirth: Secondary | ICD-10-CM | POA: Diagnosis present

## 2014-10-02 DIAGNOSIS — O99333 Smoking (tobacco) complicating pregnancy, third trimester: Secondary | ICD-10-CM | POA: Diagnosis present

## 2014-10-02 DIAGNOSIS — F1490 Cocaine use, unspecified, uncomplicated: Secondary | ICD-10-CM | POA: Diagnosis present

## 2014-10-02 DIAGNOSIS — F17210 Nicotine dependence, cigarettes, uncomplicated: Secondary | ICD-10-CM | POA: Diagnosis present

## 2014-10-02 DIAGNOSIS — O99344 Other mental disorders complicating childbirth: Secondary | ICD-10-CM | POA: Diagnosis present

## 2014-10-02 DIAGNOSIS — F141 Cocaine abuse: Secondary | ICD-10-CM

## 2014-10-02 LAB — CBC
HEMATOCRIT: 36.6 % (ref 35.0–47.0)
Hemoglobin: 12 g/dL (ref 12.0–16.0)
MCH: 28 pg (ref 26.0–34.0)
MCHC: 32.9 g/dL (ref 32.0–36.0)
MCV: 85.1 fL (ref 80.0–100.0)
PLATELETS: 187 10*3/uL (ref 150–440)
RBC: 4.3 MIL/uL (ref 3.80–5.20)
RDW: 15.4 % — ABNORMAL HIGH (ref 11.5–14.5)
WBC: 17.8 10*3/uL — ABNORMAL HIGH (ref 3.6–11.0)

## 2014-10-02 LAB — TYPE AND SCREEN
ABO/RH(D): A NEG
ANTIBODY SCREEN: POSITIVE

## 2014-10-02 LAB — URINE DRUG SCREEN, QUALITATIVE (ARMC ONLY)
AMPHETAMINES, UR SCREEN: NOT DETECTED
BARBITURATES, UR SCREEN: NOT DETECTED
Benzodiazepine, Ur Scrn: NOT DETECTED
Cannabinoid 50 Ng, Ur ~~LOC~~: NOT DETECTED
Cocaine Metabolite,Ur ~~LOC~~: POSITIVE — AB
MDMA (ECSTASY) UR SCREEN: NOT DETECTED
Methadone Scn, Ur: NOT DETECTED
OPIATE, UR SCREEN: NOT DETECTED
Phencyclidine (PCP) Ur S: NOT DETECTED
Tricyclic, Ur Screen: NOT DETECTED

## 2014-10-02 LAB — CHLAMYDIA/NGC RT PCR (ARMC ONLY)
Chlamydia Tr: DETECTED — AB
N gonorrhoeae: NOT DETECTED

## 2014-10-02 MED ORDER — OXYTOCIN 40 UNITS IN LACTATED RINGERS INFUSION - SIMPLE MED
62.5000 mL/h | INTRAVENOUS | Status: DC
  Administered 2014-10-02: 40 [IU] via INTRAVENOUS

## 2014-10-02 MED ORDER — OXYCODONE-ACETAMINOPHEN 5-325 MG PO TABS
1.0000 | ORAL_TABLET | ORAL | Status: DC | PRN
  Administered 2014-10-02 – 2014-10-03 (×3): 1 via ORAL
  Filled 2014-10-02 (×2): qty 1

## 2014-10-02 MED ORDER — LACTATED RINGERS IV SOLN: INTRAVENOUS | Status: DC

## 2014-10-02 MED ORDER — OXYTOCIN BOLUS FROM INFUSION: 500.0000 mL | INTRAVENOUS | Status: DC

## 2014-10-02 MED ORDER — DIPHENHYDRAMINE HCL 25 MG PO CAPS: 25.0000 mg | ORAL_CAPSULE | Freq: Four times a day (QID) | ORAL | Status: DC | PRN

## 2014-10-02 MED ORDER — LANOLIN HYDROUS EX OINT: TOPICAL_OINTMENT | CUTANEOUS | Status: DC | PRN

## 2014-10-02 MED ORDER — IBUPROFEN 600 MG PO TABS
600.0000 mg | ORAL_TABLET | Freq: Four times a day (QID) | ORAL | Status: DC
  Administered 2014-10-03 – 2014-10-04 (×8): 600 mg via ORAL
  Filled 2014-10-02 (×8): qty 1

## 2014-10-02 MED ORDER — OXYCODONE-ACETAMINOPHEN 5-325 MG PO TABS
ORAL_TABLET | ORAL | Status: AC
  Administered 2014-10-03: 1 via ORAL
  Filled 2014-10-02: qty 1

## 2014-10-02 MED ORDER — OXYCODONE-ACETAMINOPHEN 5-325 MG PO TABS
2.0000 | ORAL_TABLET | ORAL | Status: DC | PRN
  Administered 2014-10-02 – 2014-10-04 (×9): 2 via ORAL
  Filled 2014-10-02 (×9): qty 2

## 2014-10-02 MED ORDER — PRENATAL MULTIVITAMIN CH
1.0000 | ORAL_TABLET | Freq: Every day | ORAL | Status: DC
  Administered 2014-10-03 – 2014-10-04 (×2): 1 via ORAL
  Filled 2014-10-02 (×2): qty 1

## 2014-10-02 MED ORDER — CITRIC ACID-SODIUM CITRATE 334-500 MG/5ML PO SOLN: 30.0000 mL | ORAL | Status: DC | PRN

## 2014-10-02 MED ORDER — ACETAMINOPHEN 325 MG PO TABS: 650.0000 mg | ORAL_TABLET | ORAL | Status: DC | PRN

## 2014-10-02 MED ORDER — WITCH HAZEL-GLYCERIN EX PADS: 1.0000 "application " | MEDICATED_PAD | CUTANEOUS | Status: DC | PRN

## 2014-10-02 MED ORDER — NICOTINE 14 MG/24HR TD PT24
14.0000 mg | MEDICATED_PATCH | Freq: Every day | TRANSDERMAL | Status: DC
  Administered 2014-10-02 – 2014-10-04 (×3): 14 mg via TRANSDERMAL
  Filled 2014-10-02 (×3): qty 1

## 2014-10-02 MED ORDER — LACTATED RINGERS IV SOLN
500.0000 mL | INTRAVENOUS | Status: DC | PRN
  Administered 2014-10-02: 1000 mL via INTRAVENOUS

## 2014-10-02 MED ORDER — ONDANSETRON HCL 4 MG/2ML IJ SOLN: 4.0000 mg | Freq: Four times a day (QID) | INTRAMUSCULAR | Status: DC | PRN

## 2014-10-02 MED ORDER — SIMETHICONE 80 MG PO CHEW: 80.0000 mg | CHEWABLE_TABLET | ORAL | Status: DC | PRN

## 2014-10-02 MED ORDER — ONDANSETRON HCL 4 MG PO TABS: 4.0000 mg | ORAL_TABLET | ORAL | Status: DC | PRN

## 2014-10-02 MED ORDER — IBUPROFEN 600 MG PO TABS
ORAL_TABLET | ORAL | Status: AC
  Filled 2014-10-02: qty 1

## 2014-10-02 MED ORDER — SENNOSIDES-DOCUSATE SODIUM 8.6-50 MG PO TABS
2.0000 | ORAL_TABLET | ORAL | Status: DC
  Administered 2014-10-04 (×2): 2 via ORAL
  Filled 2014-10-02 (×3): qty 2

## 2014-10-02 MED ORDER — DIBUCAINE 1 % RE OINT: 1.0000 "application " | TOPICAL_OINTMENT | RECTAL | Status: DC | PRN

## 2014-10-02 MED ORDER — OXYTOCIN 40 UNITS IN LACTATED RINGERS INFUSION - SIMPLE MED
INTRAVENOUS | Status: AC
  Administered 2014-10-02: 40 [IU] via INTRAVENOUS
  Filled 2014-10-02: qty 1000

## 2014-10-02 MED ORDER — ONDANSETRON HCL 4 MG/2ML IJ SOLN: 4.0000 mg | INTRAMUSCULAR | Status: DC | PRN

## 2014-10-02 MED ORDER — IBUPROFEN 600 MG PO TABS
600.0000 mg | ORAL_TABLET | Freq: Four times a day (QID) | ORAL | Status: DC
  Administered 2014-10-02 (×2): 600 mg via ORAL
  Filled 2014-10-02: qty 1

## 2014-10-02 MED ORDER — BENZOCAINE-MENTHOL 20-0.5 % EX AERO: 1.0000 "application " | INHALATION_SPRAY | CUTANEOUS | Status: DC | PRN

## 2014-10-02 NOTE — Progress Notes (Signed)
Received to [facility] with co contractions and ready to have baby- cervical assessment performed- IV started and labs drawn. Family/friends at bedside and supportive. Pt yelling wanting medicine.

## 2014-10-02 NOTE — Discharge Summary (Addendum)
Obstetric Discharge Summary Reason for Admission: onset of labor Prenatal Procedures: none Intrapartum Procedures: spontaneous vaginal delivery Postpartum Procedures: none Complications-Operative and Postpartum: none  HEMOGLOBIN  Date Value Ref Range Status  10/03/2014 10.7* 12.0 - 16.0 g/dL Final   HGB  Date Value Ref Range Status  04/06/2013 11.9* 12.0-16.0 g/dL Final   HCT  Date Value Ref Range Status  10/03/2014 32.1* 35.0 - 47.0 % Final  04/07/2013 31.8* 35.0-47.0 % Final    Physical Exam:  General: NAD Lochia: appropriate Uterine Fundus: firm, below umbilicus DVT Evaluation: no evidence of DVT on exam, no cords, no ttp.  Discharge Diagnoses: Term Pregnancy-delivered, cocaine use, grandmultipara, depression  Discharge Information:  To home, with baby    Medication List    TAKE these medications        acetaminophen 325 MG tablet  Commonly known as:  TYLENOL  Take 2 tablets (650 mg total) by mouth every 4 (four) hours as needed (for pain scale < 4ORtemperature>/=100.5 F).     ibuprofen 600 MG tablet  Commonly known as:  ADVIL,MOTRIN  Take 1 tablet (600 mg total) by mouth every 6 (six) hours.     nicotine 14 mg/24hr patch  Commonly known as:  NICODERM CQ - dosed in mg/24 hours  Place 1 patch (14 mg total) onto the skin daily.     prenatal multivitamin Tabs tablet  Take 1 tablet by mouth daily.     sertraline 50 MG tablet -- if needed, for post partum depression  Commonly known as:  ZOLOFT  Take 1 tablet (50 mg total) by mouth daily.        Follow-up Information    Follow up with [facility] Department. Schedule an appointment as soon as possible for a visit in 1 week.   Why:  Postpartum depression check-in/check-up   Contact information:   [address] [place] [address] [phone]       Follow up with [facility] Department. Schedule an appointment as soon as possible for a visit in 4 weeks.    Why:  post partum care   Contact information:   [address] [place] [address] [phone]       ----- Ranae Plumber, MD Attending Obstetrician and Gynecologist [facility]

## 2014-10-02 NOTE — H&P (Signed)
Obstetric H&P   Chief Complaint: Contractions  Prenatal Care Provider: [facility]  History of Present Illness: 29 y.o. [id number] [redacted]w[redacted]d by 10/03/2014 presenting with SROM and contractions. +FM, no VB.  Pregnanchy noteable for cocaine positive and THC positive drugs screens, gonorrhea, ASCUS pap.  History of EtOH abuse with no use during pregnancy.   ABO, Rh: --/--/A NEG, A NEG (06/26 1856)  Antibody: POS (06/26 1856)  Rubella: Immune Varicella:Immune RPR: Non Reactive (06/26 1856)  HBsAg: Negative (02/23 0000)  HIV: Non-reactive (02/23 0000)  RPR: Non Reactive (06/26 1856) GBS:   NEED results not in chart  Review of Systems: 10 point review of systems negative unless otherwise noted in HPI  Past Medical History: Past Medical History  Diagnosis Date  . Asthma     as child  . Gonorrhea   . UTI (lower urinary tract infection)   . 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 Insufficient prenatal care   . Vaginal Pap smear with ASC-US   . History of HPV infection   . Alcohol abuse   . Chlamydia   . Chlamydia   . Cocaine use complicating pregnancy   . Marijuana abuse     Past Surgical History: Past Surgical History  Procedure Laterality Date  . No past surgeries     Family History: Family History  Problem Relation Age of Onset  . Anesthesia problems Neg Hx   . Alcohol abuse Neg Hx     Social History: History   Social History  . Marital Status: Single    Spouse Name: N/A  . Number of Children: N/A  . Years of Education: N/A   Occupational History  . Not on file.   Social History Main Topics  . Smoking status: Current Every Day Smoker -- 0.50 packs/day for 14 years  . Smokeless tobacco: Never Used  . Alcohol Use: Yes     Comment: occassionally   . Drug Use: Yes    Special: Cocaine, Marijuana     Comment: cocaine x1 last week. Has only been 1 time in pregnancy   . Sexual Activity: Yes    Birth Control/ Protection: None   Other Topics Concern  . Not on file   Social History Narrative     Medications: Prior to Admission medications   Medication Sig Start Date End Date Taking? Authorizing Provider  Prenatal Vit-Fe Fumarate-FA (PRENATAL MULTIVITAMIN) TABS Take 1 tablet by mouth at bedtime.     Historical Provider, MD  sertraline (ZOLOFT) 25 MG tablet Take 1 tablet (25 mg total) by mouth daily. 07/19/11 07/18/12  Dorathy Kinsman, CNM    Allergies: No Known Allergies  Physical Exam: Vitals: Last menstrual period 12/27/2013, not currently breastfeeding.   General: painfully contracting HEENT: normocephalic, anicteric Abdomen: soft, gravid Extremities:  Labs: No results found for this or any previous visit (from the past 24 hour(s)).  Assessment: 29 y.o. [id number] [redacted]w[redacted]d by 10/03/2014, precipitous delivery on arrival  Plan: 1) Delivery - precipitously  2) Drug use - UDS ordered on admission  4) TDAP - given 6/20  5) Disposition - anticpate discharge PPD2

## 2014-10-03 LAB — CBC
HCT: 32.1 % — ABNORMAL LOW (ref 35.0–47.0)
Hemoglobin: 10.7 g/dL — ABNORMAL LOW (ref 12.0–16.0)
MCH: 28.6 pg (ref 26.0–34.0)
MCHC: 33.4 g/dL (ref 32.0–36.0)
MCV: 85.5 fL (ref 80.0–100.0)
Platelets: 127 10*3/uL — ABNORMAL LOW (ref 150–440)
RBC: 3.75 MIL/uL — ABNORMAL LOW (ref 3.80–5.20)
RDW: 15.3 % — AB (ref 11.5–14.5)
WBC: 11.6 10*3/uL — AB (ref 3.6–11.0)

## 2014-10-03 LAB — RPR: RPR Ser Ql: NONREACTIVE

## 2014-10-03 NOTE — Progress Notes (Signed)
Post Partum Day #1 Subjective: Doing well.  No complaints.  Voiding, ambulating, tolerating regular PO diet, tolerating pain with PO meds. Denies: CP SOB F/C, N/V, calf pain    Objective: Blood pressure 97/49, pulse 62, temperature 97.6 F (36.4 C), temperature source Oral, resp. rate 18, SpO2 100 %, currently breastfeeding.  Physical Exam:  General: NAD CV: RRR Pulm: nl effort, CTABL Lochia: moderate Uterine Fundus: firm and below umbilicus DVT Evaluation: bilateral LEs no ttp, cords   Recent Labs  10/02/14 1501 10/03/14 0626  HGB 12.0 10.7*  HCT 36.6 32.1*  WBC 17.8* 11.6*  PLT 187 127*    Assessment/Plan: 29yo G8P8 s/p precipitous SVD with positive cocaine on UDS  1. Doing well post partum, meeting goals 2. Breastfeeding discouraged for now by peds, but continues to do so.  Infant tested positive as well. 3. SW pending 4. Rhogam not indicated - Baby A neg 5. Continue inpatient care   Tonya Nichols C 10/03/2014, 1:06 PM

## 2014-10-03 NOTE — Progress Notes (Deleted)
10/03/2014 @ 2310pm all dr's discharge instructions reviewed with patient; she v/u of same; copy given; patient left 3rd floor via w/c with infant in her arms accompanied by lafonda robinson cna and her husband.

## 2014-10-04 MED ORDER — MEDROXYPROGESTERONE ACETATE 150 MG/ML IM SUSP: 150.0000 mg | Freq: Once | INTRAMUSCULAR | Status: DC

## 2014-10-04 MED ORDER — IBUPROFEN 600 MG PO TABS: 600.0000 mg | ORAL_TABLET | Freq: Four times a day (QID) | ORAL | Status: DC

## 2014-10-04 MED ORDER — SERTRALINE HCL 50 MG PO TABS: 50.0000 mg | ORAL_TABLET | Freq: Every day | ORAL | Status: DC

## 2014-10-04 MED ORDER — MEDROXYPROGESTERONE ACETATE 150 MG/ML IM SUSP
150.0000 mg | Freq: Once | INTRAMUSCULAR | Status: AC
  Administered 2014-10-04: 150 mg via INTRAMUSCULAR
  Filled 2014-10-04: qty 1

## 2014-10-04 MED ORDER — NICOTINE 14 MG/24HR TD PT24: 14.0000 mg | MEDICATED_PATCH | Freq: Every day | TRANSDERMAL | Status: DC

## 2014-10-04 MED ORDER — ACETAMINOPHEN 325 MG PO TABS: 650.0000 mg | ORAL_TABLET | ORAL | Status: DC | PRN

## 2014-10-04 NOTE — Progress Notes (Signed)
Prenatal records indicate that pt received TDaP vaccine on 08/24/14 and Influenza vaccine on 04/28/14. Reynold Bowen, RN 10/04/2014 6:13 PM

## 2014-10-04 NOTE — Progress Notes (Signed)
All dr's discharge instructions reviewed with patient; she v/u of same; copy given; Rxs. Given.

## 2014-10-04 NOTE — Clinical Social Work Maternal (Signed)
  CLINICAL SOCIAL WORK MATERNAL/CHILD NOTE  Patient Details  Name: Tonya Nichols MRN: [id number] Date of Birth: 04/02/1985  Date:  10/04/2014  Clinical Social Worker Initiating Note:   Kandy Garrison, MSW) Date/ Time Initiated:  10/03/14/1230 (assessment only started, pt wanted privacy to feed her baby)     Child's Name:      Legal Guardian:  Mother   Need for Interpreter:  None   Date of Referral:  10/03/14     Reason for Referral:  Current Substance Use/Substance Use During Pregnancy    Referral Source:  RN   Address:  [address]  Phone number:  [phone]   Household Members:  Self, Minor Children, Parents, Significant Other, Other (Comment) (Mothers boyfriend)   Natural Supports (not living in the home):  Other (Comment) Marjo Bicker father)   Professional Supports: None   Employment:  (pt is in the process of applying for a job with [employer], and had a job during pregnancy but had to stop working at the end of pregnancy )   Type of Work: Therapist, 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  pt plans on asking CPS woker for assistance with housing assistance )   Cultural/Religious Considerations Which May Impact Care:  family is of lower income  Strengths:  Ability to meet basic needs , Home prepared for child  (supportive mother, and father of the child is also supportive per pt.)   Risk Factors/Current Problems:   (positive drug screen )   Cognitive State:  Alert , Goal Oriented , Able to Concentrate    Mood/Affect:   (pt stated that she was anxious to get home to her other children.  But she was coorperative and pleasent during the assessment)   CSW Assessment: CSW spoke to mother.  She was attentive with her baby while speaking with the CSW.  CSW did not see any red flags as far as dangers to her children when she in the hospital.  The baby was either sleeping or  feeding when CSW went to speak with pt.  Per pt this is her 8th child.  4 live with her and the other 4 live with their father.  Pt has had CSW involvement in the past however both times cases were dismissed and children were not danger. CSW notified pt that CPS would need to be notified because of the positive tox screen.  Pt was ok with this.  CSW has made a CPS report.  CSW asked if pt felt safe returning to her home with her mother and her mother's boyfriend.  She stated that she was and that she was anxious to see her other children again too. Pt's mother also confirmed that pt was welcome to stay with her and that they all support one another in the home.  Pt also stated the she was going to start looking into getting another job and wanted to talk to the CPS worker about housing.  CSW signing off.    CSW Plan/Description:  Child Protective Service Report     Joetta Manners 10/04/2014, 3:52 PM

## 2014-10-04 NOTE — Discharge Instructions (Signed)
Call your doctor for increased pain or vaginal bleeding, temperature above 100.4, depression, or concerns.  No strenuous activity or heavy lifting for 6 weeks.  No intercourse, tampons, douching, or enemas for 6 weeks.  No tub baths-showers only.  No driving for 2 weeks or while taking pain medications.  Continue prenatal vitamin and iron.    Please contact [facility] if you feel you are needing or ready for help with substance abuse.

## 2014-10-04 NOTE — Clinical Social Work Note (Signed)
Clinical Social Work Assessment  Patient Details  Name: Tonya Nichols MRN: [id number] Date of Birth: 11/04/1985  Date of referral:                  Reason for consult:                   Permission sought to share information with:    Permission granted to share information::     Name::        Agency::     Relationship::     Contact Information:     Housing/Transportation Living arrangements for the past 2 months:    Source of Information:    Patient Interpreter Needed:    Criminal Activity/Legal Involvement Pertinent to Current Situation/Hospitalization:    Significant Relationships:    Lives with:    Do you feel safe going back to the place where you live?    Need for family participation in patient care:     Care giving concerns:     Office manager / plan:    Employment status:    Insurance information:    PT Recommendations:    Information / Referral to community resources:     Patient/Family's Response to care:    Patient/Family's Understanding of and Emotional Response to Diagnosis, Current Treatment, and Prognosis:    Emotional Assessment Appearance:    Attitude/Demeanor/Rapport:    Affect (typically observed):    Orientation:    Alcohol / Substance use:    Psych involvement (Current and /or in the community):     Discharge Needs  Concerns to be addressed:    Readmission within the last 30 days:    Current discharge risk:    Barriers to Discharge:      Chauncy Passy, LCSW 10/04/2014, 8:55 AM

## 2014-10-05 NOTE — Progress Notes (Signed)
10/04/2014 @ 2355 pm patient discharged; patient will remain with her infant as infant will now be a pediatric patient.

## 2016-08-28 LAB — HM HIV SCREENING LAB: HM HIV Screening: NEGATIVE

## 2016-09-13 ENCOUNTER — Encounter: Payer: Self-pay | Admitting: Emergency Medicine

## 2016-09-13 ENCOUNTER — Emergency Department: Payer: Self-pay

## 2016-09-13 ENCOUNTER — Emergency Department
Admission: EM | Admit: 2016-09-13 | Discharge: 2016-09-13 | Disposition: A | Discharge status: Home / Self Care | Payer: Self-pay | Attending: Emergency Medicine | Admitting: Emergency Medicine

## 2016-09-13 DIAGNOSIS — N3090 Cystitis, unspecified without hematuria: Secondary | ICD-10-CM | POA: Insufficient documentation

## 2016-09-13 DIAGNOSIS — J45909 Unspecified asthma, uncomplicated: Secondary | ICD-10-CM | POA: Insufficient documentation

## 2016-09-13 DIAGNOSIS — F17210 Nicotine dependence, cigarettes, uncomplicated: Secondary | ICD-10-CM | POA: Insufficient documentation

## 2016-09-13 DIAGNOSIS — Z79899 Other long term (current) drug therapy: Secondary | ICD-10-CM | POA: Insufficient documentation

## 2016-09-13 LAB — URINALYSIS, COMPLETE (UACMP) WITH MICROSCOPIC
Bilirubin Urine: NEGATIVE
Glucose, UA: NEGATIVE mg/dL
Ketones, ur: 5 mg/dL — AB
Nitrite: NEGATIVE
Protein, ur: 30 mg/dL — AB
Specific Gravity, Urine: 1.011 (ref 1.005–1.030)
pH: 6 (ref 5.0–8.0)

## 2016-09-13 LAB — POCT PREGNANCY, URINE: PREG TEST UR: NEGATIVE

## 2016-09-13 MED ORDER — OXYCODONE-ACETAMINOPHEN 5-325 MG PO TABS: 1.0000 | ORAL_TABLET | Freq: Four times a day (QID) | ORAL | 0 refills | Status: DC | PRN

## 2016-09-13 MED ORDER — ORPHENADRINE CITRATE 30 MG/ML IJ SOLN
60.0000 mg | Freq: Two times a day (BID) | INTRAMUSCULAR | Status: DC
  Administered 2016-09-13: 60 mg via INTRAMUSCULAR
  Filled 2016-09-13: qty 2

## 2016-09-13 MED ORDER — IBUPROFEN 600 MG PO TABS: 600.0000 mg | ORAL_TABLET | Freq: Four times a day (QID) | ORAL | 0 refills | Status: DC | PRN

## 2016-09-13 MED ORDER — KETOROLAC TROMETHAMINE 60 MG/2ML IM SOLN
60.0000 mg | Freq: Once | INTRAMUSCULAR | Status: AC
  Administered 2016-09-13: 60 mg via INTRAMUSCULAR
  Filled 2016-09-13: qty 2

## 2016-09-13 MED ORDER — SULFAMETHOXAZOLE-TRIMETHOPRIM 800-160 MG PO TABS: 1.0000 | ORAL_TABLET | Freq: Two times a day (BID) | ORAL | 0 refills | Status: DC

## 2016-09-13 NOTE — ED Triage Notes (Addendum)
Pt reports lumbar pain x2 days, now with pain radiating down right leg. Pt reports pain is worse with walking. Pt also reports feeling of urinary retention after urinating. Pt denies burning.

## 2016-09-13 NOTE — ED Provider Notes (Signed)
[facility] Emergency Department Provider Note   ____________________________________________   First MD Initiated Contact with Patient 09/13/16 [phone]     (approximate)  I have reviewed the triage vital signs and the nursing notes.   HISTORY  Chief Complaint Back Pain    HPI Tonya Nichols is a 31 y.o. female patient complaining of lumbar pain for 2 days. Patient is radicular component to this pain to bilateral leg. Radicular pain to the right leg is worse than the left. Patient pain increased with walking. Patient also reported feeling the urinary retention but denies dysuria or frequency. Patient denies bowel dysfunction. Patient menstrual or pregnancy since she just recently restarted Depo provera..Patient rates the pain as a 10 over 10. No palliative measures for complaint.   Past Medical History:  Diagnosis Date  . Alcohol abuse   . Asthma    as child  . Chlamydia   . Chlamydia   . Cocaine use complicating pregnancy   . Gonorrhea   . 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 History of HPV infection   . Insufficient prenatal care   . Marijuana abuse   . UTI (lower urinary tract infection)   . Vaginal Pap smear with ASC-US     Patient Active Problem List   Diagnosis Date Noted  . Labor and delivery, indication for care 10/02/2014  . Abdominal pain affecting pregnancy 08/30/2014  . UTI (lower urinary tract infection)   . Alcohol abuse   . 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 Vaginal Pap smear with ASC-US   . Cocaine use complicating pregnancy   . Marijuana abuse   . 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 NO PAST SURGERIES      Prior to Admission medications   Medication Sig Start Date End Date Taking? Authorizing Provider  acetaminophen (TYLENOL) 325 MG tablet Take 2 tablets (650 mg total) by mouth every 4 (four) hours as needed (for pain scale < 4ORtemperature>/=100.5 F). 10/04/14   Ward, Elenora Fenderhelsea C, MD    ibuprofen (ADVIL,MOTRIN) 600 MG tablet Take 1 tablet (600 mg total) by mouth every 6 (six) hours. 10/04/14   Ward, Elenora Fenderhelsea C, MD  ibuprofen (ADVIL,MOTRIN) 600 MG tablet Take 1 tablet (600 mg total) by mouth every 6 (six) hours as needed. 09/13/16   Joni ReiningSmith, Tariya Morrissette K, PA-C  medroxyPROGESTERone (DEPO-PROVERA) 150 MG/ML injection Inject 1 mL (150 mg total) into the muscle once. 10/04/14   Ward, Elenora Fenderhelsea C, MD  nicotine (NICODERM CQ - DOSED IN MG/24 HOURS) 14 mg/24hr patch Place 1 patch (14 mg total) onto the skin daily. 10/04/14   Ward, Elenora Fenderhelsea C, MD  oxyCODONE-acetaminophen (ROXICET) 5-325 MG tablet Take 1 tablet by mouth every 6 (six) hours as needed for moderate pain. 09/13/16   Joni ReiningSmith, Hazeline Charnley K, PA-C  Prenatal Vit-Fe Fumarate-FA (PRENATAL MULTIVITAMIN) TABS Take 1 tablet by mouth daily.     [provider]  sertraline (ZOLOFT) 50 MG tablet Take 1 tablet (50 mg total) by mouth daily. 10/04/14   Ward, Elenora Fenderhelsea C, MD  sulfamethoxazole-trimethoprim (BACTRIM DS,SEPTRA DS) 800-160 MG tablet Take 1 tablet by mouth 2 (two) times daily. 09/13/16   Joni ReiningSmith, Jahmya Onofrio K, PA-C    Allergies Patient has no known allergies.  Family History  Problem Relation Age of Onset  . Anesthesia problems Neg Hx   . Alcohol abuse Neg Hx     Social History Social History  Substance Use Topics  . Smoking status: Current Every  Day Smoker    Packs/day: 0.50    Years: 14.00  . Smokeless tobacco: Never Used  . Alcohol use Yes     Comment: occassionally     Review of Systems Constitutional: No fever/chills Eyes: No visual changes. ENT: No sore throat. Cardiovascular: Denies chest pain. Respiratory: Denies shortness of breath. Gastrointestinal: abdominal pain.  No nausea, no vomiting.  No diarrhea.  No constipation. Genitourinary: Negative for dysuria. States urinary urgency and retention. Musculoskeletal: Positive for back pain. Skin: Negative for rash. Neurological: Negative for headaches, focal weakness or  numbness. Psychiatric:History of drug and alcohol abuse   ____________________________________________   PHYSICAL EXAM:  VITAL SIGNS: ED Triage Vitals  Enc Vitals Group     BP --      Pulse --      Resp --      Temp --      Temp src --      SpO2 --      Weight 09/13/16 0843 198 lb (89.8 kg)     Height 09/13/16 0843 5\' 4"  (1.626 m)     Head Circumference --      Peak Flow --      Pain Score 09/13/16 0842 10     Pain Loc --      Pain Edu? --      Excl. in GC? --     Constitutional: Alert and oriented. Well appearing and in no acute distress. Cardiovascular: Normal rate, regular rhythm. Grossly normal heart sounds.  Good peripheral circulation. Respiratory: Normal respiratory effort.  No retractions. Lungs CTAB. Gastrointestinal: Soft and nontender. No distention. No abdominal bruits. Left CVA tenderness. Musculoskeletal: No obvious spinal deformity. Nuchal and palpation spinal processes. No lower extremity tenderness nor edema.  No joint effusions. Neurologic:  Normal speech and language. No gross focal neurologic deficits are appreciated. No gait instability. Skin:  Skin is warm, dry and intact. No rash noted. Psychiatric: Mood and affect are normal. Speech and behavior are normal.  ____________________________________________   LABS (all labs ordered are listed, but only abnormal results are displayed)  Labs Reviewed  URINALYSIS, COMPLETE (UACMP) WITH MICROSCOPIC - Abnormal; Notable for the following:       Result Value   Color, Urine YELLOW (*)    APPearance CLEAR (*)    Hgb urine dipstick MODERATE (*)    Ketones, ur 5 (*)    Protein, ur 30 (*)    Leukocytes, UA SMALL (*)    Bacteria, UA RARE (*)    Squamous Epithelial / LPF 0-5 (*)    All other components within normal limits  POC URINE PREG, ED  POCT PREGNANCY, URINE   ____________________________________________  EKG   ____________________________________________  RADIOLOGY  Dg Lumbar Spine  Complete  Result Date: 09/13/2016 CLINICAL DATA:  Pt reports lumbar pain x2 days, now with pain radiating down right leg. Pt reports pain is worse with walking. Pt also reports feeling of urinary retention after urinating. Pt denies burning. EXAM: LUMBAR SPINE - COMPLETE 4+ VIEW COMPARISON:  None. FINDINGS: There is no evidence of lumbar spine fracture. Alignment is normal. Intervertebral disc spaces are maintained. Minimal spurring in the visualized lower thoracic spine. IMPRESSION: Negative lumbar spine Electronically Signed   By: Corlis Leak M.D.   On: 09/13/2016 09:40   Ct Renal Stone Study  Result Date: 09/13/2016 CLINICAL DATA:  Lumbar pain for 2 days with radiation into the right leg. Urinary retention. EXAM: CT ABDOMEN AND PELVIS WITHOUT CONTRAST TECHNIQUE: Multidetector CT imaging  of the abdomen and pelvis was performed following the standard protocol without IV contrast. COMPARISON:  None. FINDINGS: Lower chest: Heart size normal. No pericardial or pleural effusion. Lung bases are clear. There may be slight thickening of the distal esophagus which can be seen with gastroesophageal reflux. Hepatobiliary: Liver and gallbladder are unremarkable. No biliary ductal dilatation. Pancreas: Negative. Spleen: Negative. Adrenals/Urinary Tract: Adrenal glands and right kidney are unremarkable. Left renal edema and mild left hydronephrosis. No definite urinary stones. Phleboliths in the anatomic pelvis. There may be mild haziness adjacent to the bladder wall. Stomach/Bowel: Stomach, small bowel, appendix and colon are unremarkable. Vascular/Lymphatic: Vascular structures are unremarkable. No pathologically enlarged lymph nodes. Reproductive: Uterus is visualized.  No adnexal mass. Other: No free fluid. Mesenteries and peritoneum are otherwise unremarkable. Musculoskeletal: Negative. IMPRESSION: 1. Mild left renal edema and mild left hydronephrosis without an obstructing stone. Findings may be due to  pyelonephritis. Recent passage of a stone is not excluded. 2. Mild haziness about the bladder can be seen with cystitis. Electronically Signed   By: Leanna Battles M.D.   On: 09/13/2016 10:21    _CT scan revealed hydronephrosis without evidence of stone. ___________________________________________   PROCEDURES  Procedure(s) performed: None  Procedures  Critical Care performed: No  ____________________________________________   INITIAL IMPRESSION / ASSESSMENT AND PLAN / ED COURSE  Pertinent labs & imaging results that were available during my care of the patient were reviewed by me and considered in my medical decision making (see chart for details).  Cystitis. Discussed CT scan of the results with patient. Patient given discharge care instructions and a work note. Patient advised follow "clinic if condition persists.      ____________________________________________   FINAL CLINICAL IMPRESSION(S) / ED DIAGNOSES  Final diagnoses:  Cystitis      NEW MEDICATIONS STARTED DURING THIS VISIT:  New Prescriptions   IBUPROFEN (ADVIL,MOTRIN) 600 MG TABLET    Take 1 tablet (600 mg total) by mouth every 6 (six) hours as needed.   OXYCODONE-ACETAMINOPHEN (ROXICET) 5-325 MG TABLET    Take 1 tablet by mouth every 6 (six) hours as needed for moderate pain.   SULFAMETHOXAZOLE-TRIMETHOPRIM (BACTRIM DS,SEPTRA DS) 800-160 MG TABLET    Take 1 tablet by mouth 2 (two) times daily.     Note:  This document was prepared using Dragon voice recognition software and may include unintentional dictation errors.    Joni Reining, PA-C 09/13/16 1053    Minna Antis, MD 09/13/16 1534

## 2016-09-13 NOTE — ED Notes (Signed)
Pt alert and oriented X4, active, cooperative, pt in NAD. RR even and unlabored, color WNL.  Pt informed to return if any life threatening symptoms occur.   

## 2016-09-13 NOTE — ED Notes (Signed)
Pt taken to xray prior to meds given.

## 2016-10-15 ENCOUNTER — Emergency Department
Admission: EM | Admit: 2016-10-15 | Discharge: 2016-10-15 | Disposition: A | Discharge status: Home / Self Care | Payer: Medicaid Other | Attending: Emergency Medicine | Admitting: Emergency Medicine

## 2016-10-15 DIAGNOSIS — F1990 Other psychoactive substance use, unspecified, uncomplicated: Secondary | ICD-10-CM

## 2016-10-15 DIAGNOSIS — F17210 Nicotine dependence, cigarettes, uncomplicated: Secondary | ICD-10-CM | POA: Insufficient documentation

## 2016-10-15 DIAGNOSIS — J45909 Unspecified asthma, uncomplicated: Secondary | ICD-10-CM | POA: Insufficient documentation

## 2016-10-15 DIAGNOSIS — Z79899 Other long term (current) drug therapy: Secondary | ICD-10-CM | POA: Insufficient documentation

## 2016-10-15 DIAGNOSIS — F1910 Other psychoactive substance abuse, uncomplicated: Secondary | ICD-10-CM | POA: Insufficient documentation

## 2016-10-15 LAB — CBC
HCT: 41.9 % (ref 35.0–47.0)
HEMOGLOBIN: 14.3 g/dL (ref 12.0–16.0)
MCH: 28.9 pg (ref 26.0–34.0)
MCHC: 34.1 g/dL (ref 32.0–36.0)
MCV: 84.6 fL (ref 80.0–100.0)
Platelets: 211 10*3/uL (ref 150–440)
RBC: 4.95 MIL/uL (ref 3.80–5.20)
RDW: 14.2 % (ref 11.5–14.5)
WBC: 10.4 10*3/uL (ref 3.6–11.0)

## 2016-10-15 LAB — BASIC METABOLIC PANEL
Anion gap: 12 (ref 5–15)
BUN: 9 mg/dL (ref 6–20)
CO2: 20 mmol/L — ABNORMAL LOW (ref 22–32)
CREATININE: 0.87 mg/dL (ref 0.44–1.00)
Calcium: 9 mg/dL (ref 8.9–10.3)
Chloride: 112 mmol/L — ABNORMAL HIGH (ref 101–111)
GFR calc Af Amer: >60 mL/min (ref >60)
GFR calc non Af Amer: >60 mL/min (ref >60)
GLUCOSE: 86 mg/dL (ref 65–99)
POTASSIUM: 3.6 mmol/L (ref 3.5–5.1)
Sodium: 144 mmol/L (ref 135–145)

## 2016-10-15 LAB — URINE DRUG SCREEN, QUALITATIVE (ARMC ONLY)
Amphetamines, Ur Screen: NOT DETECTED
Barbiturates, Ur Screen: NOT DETECTED
Benzodiazepine, Ur Scrn: NOT DETECTED
CANNABINOID 50 NG, UR ~~LOC~~: POSITIVE — AB
COCAINE METABOLITE, UR ~~LOC~~: POSITIVE — AB
MDMA (ECSTASY) UR SCREEN: NOT DETECTED
Methadone Scn, Ur: NOT DETECTED
Opiate, Ur Screen: NOT DETECTED
PHENCYCLIDINE (PCP) UR S: NOT DETECTED
Tricyclic, Ur Screen: NOT DETECTED

## 2016-10-15 LAB — ETHANOL: Alcohol, Ethyl (B): 198 mg/dL — ABNORMAL HIGH (ref <5)

## 2016-10-15 LAB — POCT PREGNANCY, URINE: PREG TEST UR: NEGATIVE

## 2016-10-15 NOTE — ED Notes (Signed)
Pt notified of need for urine sample; pt states she does not feel the need to void, but will notify nursing staff when ready.

## 2016-10-15 NOTE — ED Notes (Signed)
Pt asking for something to eat.  Pt was told after a doctor sees her, we can get her a breakfast tray.  Pt asking for coffee and was told again after a doctor sees her.  Pt states "Im getting aggravated because people are talking and the other patient is singing and no one is doing anything for her".  Pt was told that the MD needs to see her and will be with her as soon as possible.

## 2016-10-15 NOTE — Discharge Instructions (Signed)
Please call [facility] first thing tomorrow morning to inquire about bed availability. Please do not drink alcohol or do any drugs. Return to the emergency department for any personally concerning symptoms.

## 2016-10-15 NOTE — ED Notes (Signed)
ED Provider at bedside. 

## 2016-10-15 NOTE — ED Notes (Signed)

## 2016-10-15 NOTE — ED Notes (Signed)
Pt reports she has been drinking alcohol and using cocaine frequently over the last 4 years. Pt states she is tired of living like that and wants to get help. Pt denies SI or HI. Pt reports she has 8 kids and does not want to lose her kids.

## 2016-10-15 NOTE — ED Notes (Addendum)
Mother, Clydie BraunKaren called this RN and inquiring about pt. Informed that due to HIPPA restrictions, no information can be given out about patient. Informed of visiting hours if patient will accept visitors, offered for patient to speak with mother, pt denies.

## 2016-10-15 NOTE — ED Notes (Signed)
Pt has had two phone calls, refusing to take phone calls at this time.

## 2016-10-15 NOTE — ED Provider Notes (Signed)
[facility] Emergency Department Provider Note  Time seen: 7:21 AM  I have reviewed the triage vital signs and the nursing notes.   HISTORY  Chief Complaint Addiction Problem    HPI Tonya Nichols is a 31 y.o. female With a past medical history of substance use who presents to the emergency department asking for detox. According to the patient for the past 4 years she has been using cocaine multiple times per week, as well as alcohol multiple times per week. Patient states her last cocaine and alse was just prior to arrival in the emergency department. Patient is hoping for detox. States she has never attempted detox in the past.  Denies IV drug use.   Past Medical History:  Diagnosis Date  . Alcohol abuse   . Asthma    as child  . Chlamydia   . Chlamydia   . Cocaine use complicating pregnancy   . Gonorrhea   . 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 History of HPV infection   . Insufficient prenatal care   . Marijuana abuse   . UTI (lower urinary tract infection)   . Vaginal Pap smear with ASC-US     Patient Active Problem List   Diagnosis Date Noted  . Labor and delivery, indication for care 10/02/2014  . Abdominal pain affecting pregnancy 08/30/2014  . UTI (lower urinary tract infection)   . Alcohol abuse   . 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 Vaginal Pap smear with ASC-US   . Cocaine use complicating pregnancy   . Marijuana abuse   . 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 NO PAST SURGERIES      Prior to Admission medications   Medication Sig Start Date End Date Taking? Authorizing Provider  acetaminophen (TYLENOL) 325 MG tablet Take 2 tablets (650 mg total) by mouth every 4 (four) hours as needed (for pain scale < 4ORtemperature>/=100.5 F). 10/04/14   Ward, Elenora Fender, MD  ibuprofen (ADVIL,MOTRIN) 600 MG tablet Take 1 tablet (600 mg total) by mouth every 6 (six) hours. 10/04/14   Ward, Elenora Fender, MD  ibuprofen  (ADVIL,MOTRIN) 600 MG tablet Take 1 tablet (600 mg total) by mouth every 6 (six) hours as needed. 09/13/16   Joni Reining, PA-C  medroxyPROGESTERone (DEPO-PROVERA) 150 MG/ML injection Inject 1 mL (150 mg total) into the muscle once. 10/04/14   Ward, Elenora Fender, MD  nicotine (NICODERM CQ - DOSED IN MG/24 HOURS) 14 mg/24hr patch Place 1 patch (14 mg total) onto the skin daily. 10/04/14   Ward, Elenora Fender, MD  oxyCODONE-acetaminophen (ROXICET) 5-325 MG tablet Take 1 tablet by mouth every 6 (six) hours as needed for moderate pain. 09/13/16   Joni Reining, PA-C  Prenatal Vit-Fe Fumarate-FA (PRENATAL MULTIVITAMIN) TABS Take 1 tablet by mouth daily.     [provider]  sertraline (ZOLOFT) 50 MG tablet Take 1 tablet (50 mg total) by mouth daily. 10/04/14   Ward, Elenora Fender, MD  sulfamethoxazole-trimethoprim (BACTRIM DS,SEPTRA DS) 800-160 MG tablet Take 1 tablet by mouth 2 (two) times daily. 09/13/16   Joni Reining, PA-C    No Known Allergies  Family History  Problem Relation Age of Onset  . Anesthesia problems Neg Hx   . Alcohol abuse Neg Hx     Social History Social History  Substance Use Topics  . Smoking status: Current Every Day Smoker    Packs/day: 0.50    Years: 14.00  .  Smokeless tobacco: Never Used  . Alcohol use Yes     Comment: occassionally     Review of Systems Constitutional: Negative for fever. Cardiovascular: Negative for chest pain. Respiratory: Negative for shortness of breath. Gastrointestinal: Negative for abdominal pain Musculoskeletal: Negative for back pain. Neurological: Negative for headache All other ROS negative  ____________________________________________   PHYSICAL EXAM:  VITAL SIGNS: ED Triage Vitals [10/15/16 0616]  Enc Vitals Group     BP (!) 96/55     Pulse Rate 99     Resp (!) 24     Temp 98.8 F (37.1 C)     Temp Source Oral     SpO2 95 %     Weight 195 lb (88.5 kg)     Height 5' 3.5" (1.613 m)     Head Circumference       Peak Flow      Pain Score      Pain Loc      Pain Edu?      Excl. in GC?     Constitutional: Alert and oriented. Well appearing and in no distress. Clinically appears sober. Eyes: Normal exam ENT   Head: Normocephalic and atraumatic.   Mouth/Throat: Mucous membranes are moist. Cardiovascular: Normal rate, regular rhythm. No murmur Respiratory: Normal respiratory effort without tachypnea nor retractions. Breath sounds are clear  Gastrointestinal: Soft and nontender. No distention.  Musculoskeletal: Nontender with normal range of motion in all extremities. Neurologic:  Normal speech and language. No gross focal neurologic deficits Skin:  Skin is warm, dry and intact.  Psychiatric: Mood and affect are normal.   ____________________________________________   INITIAL IMPRESSION / ASSESSMENT AND PLAN / ED COURSE  Pertinent labs & imaging results that were available during my care of the patient were reviewed by me and considered in my medical decision making (see chart for details).  The patient presents to the ER today for substance abuse asking for detox.  No medical complaints today.  Normal physical exam.  We will discuss with [facility] for referral to [facility].  Pt agreeable to plan.   ----------------------------------------- 12:50 PM on 10/15/2016 -----------------------------------------  [facility] is stated they will not have anymore female beds today. We will discharge the patient home she is to call [facility] first thing tomorrow morning. She is agreeable to this plan.  ____________________________________________   FINAL CLINICAL IMPRESSION(S) / ED DIAGNOSES  Substance use disorder    Minna AntisPaduchowski, Zara Wendt, MD 10/15/16 1250

## 2016-10-15 NOTE — ED Triage Notes (Signed)
Patient states seeking help for drug and alcohol addiction.  Patient report las used used cocain approximately 1 hour prior to arrival, last drink 45 minutes prior to arrival.

## 2016-11-15 IMAGING — US US OB FOLLOW-UP
1 series · 14 of 28 positions shown · non-contrast
Comparison: none

[Series 1: us ob follow-up · 0.28mm/px · 14 of 48 slices shown]
[im 2/48]
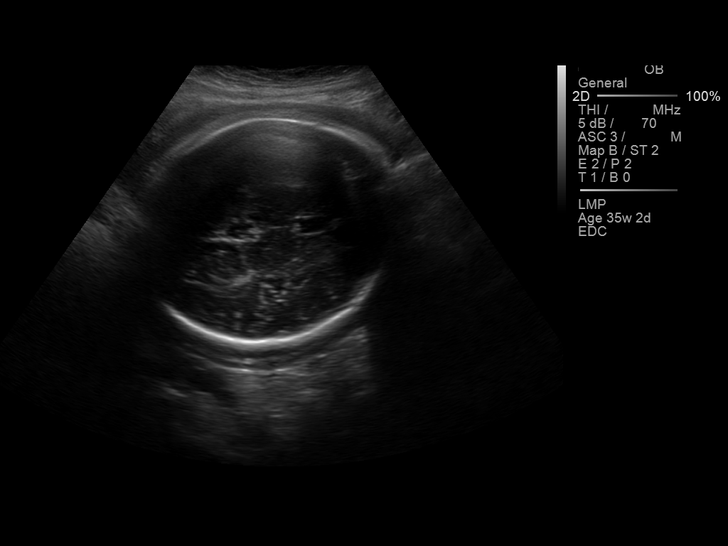
[im 6/48]
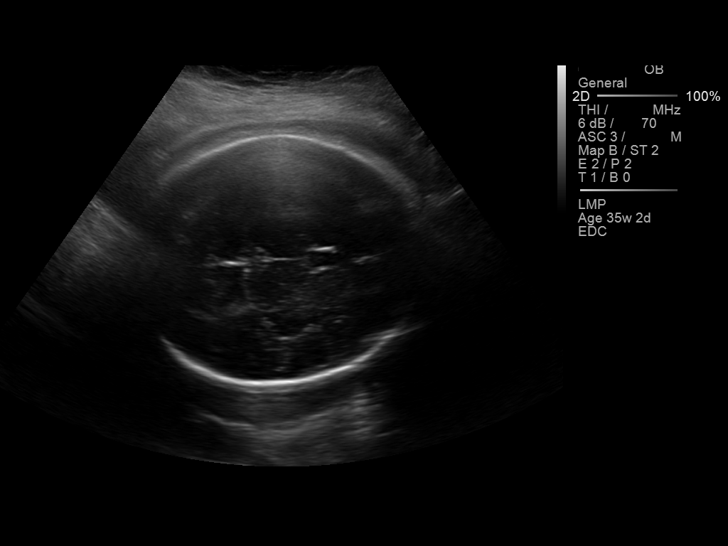
[im 9/48]
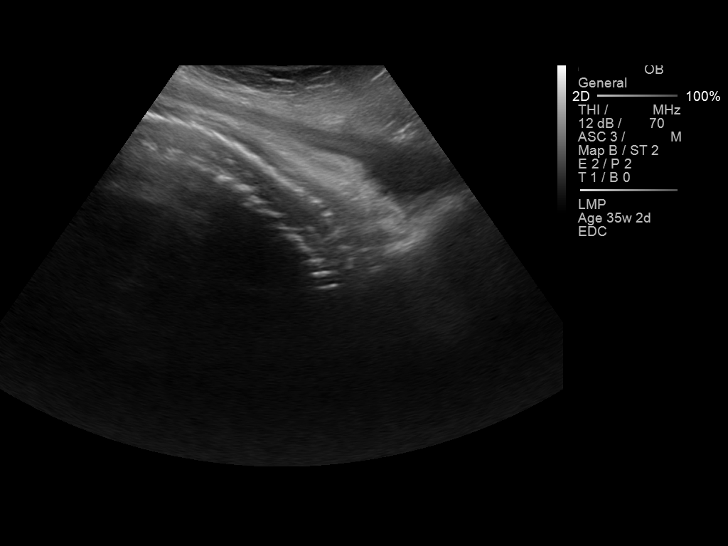
[im 13/48]
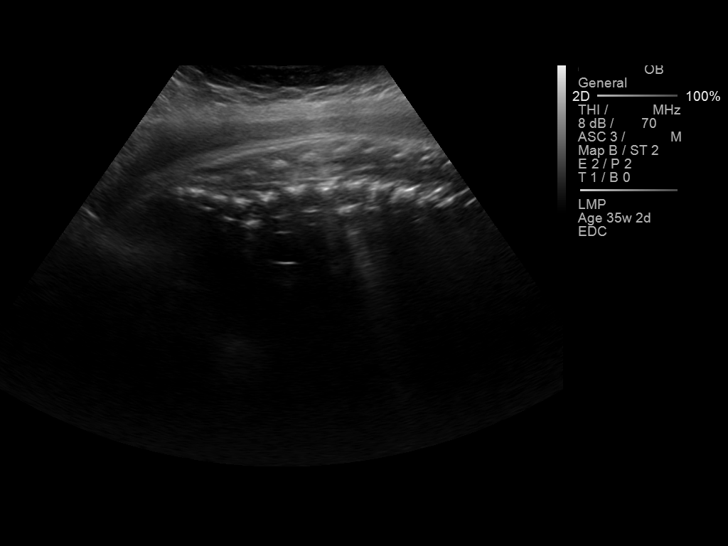
[im 16/48]
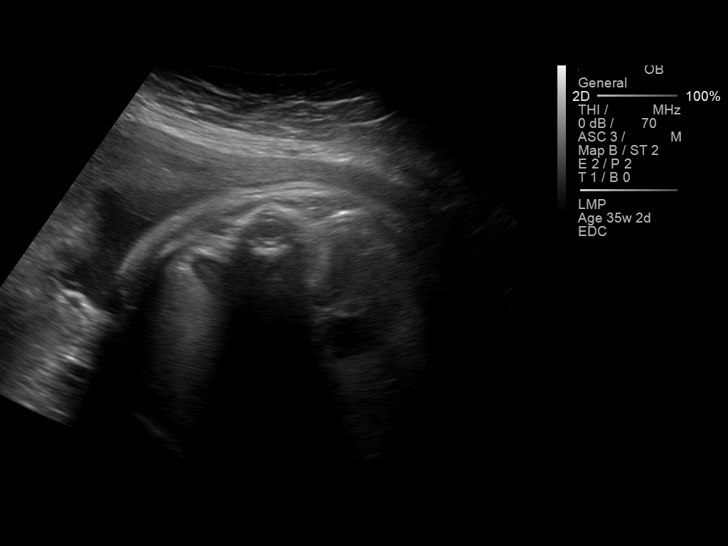
[im 20/48]
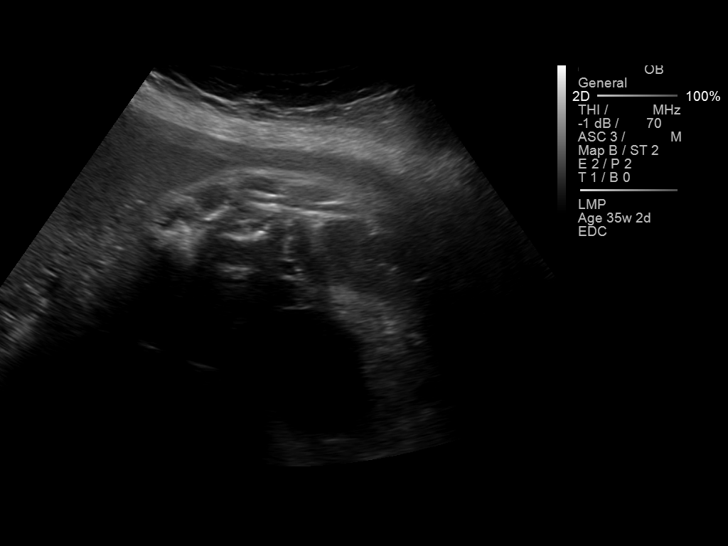
[im 23/48]
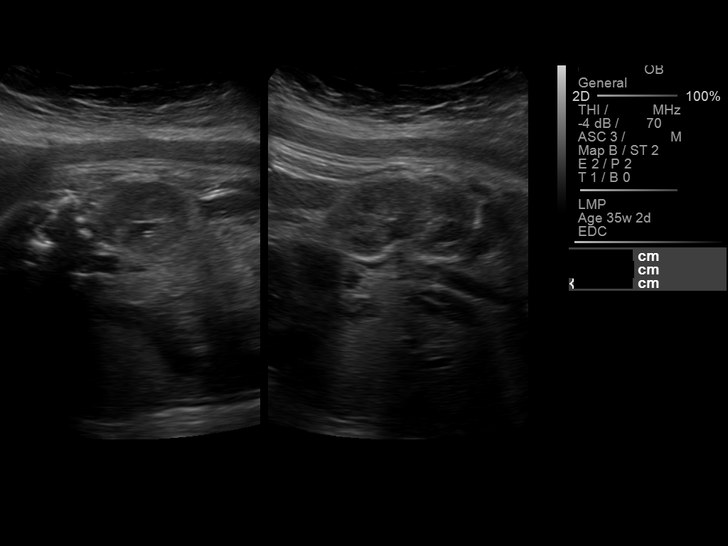
[im 27/48]
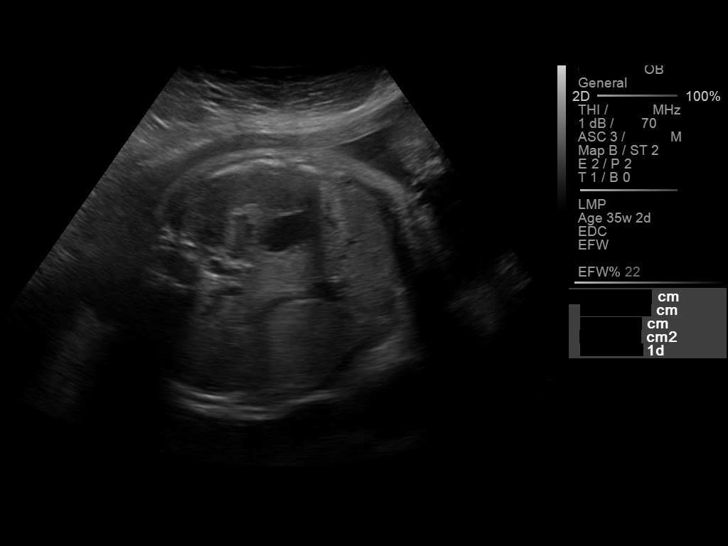
[im 30/48]
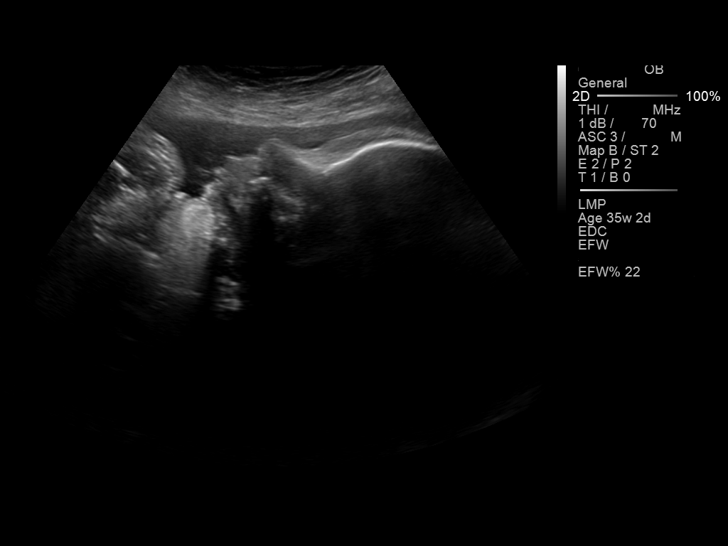
[im 34/48]
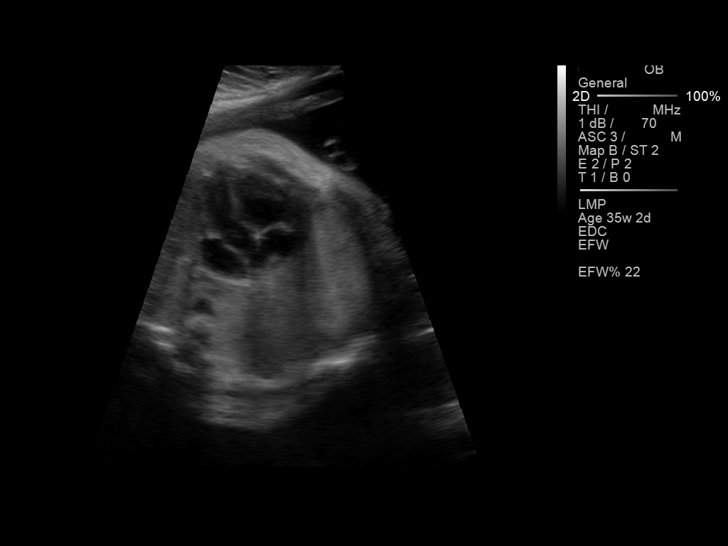
[im 37/48]
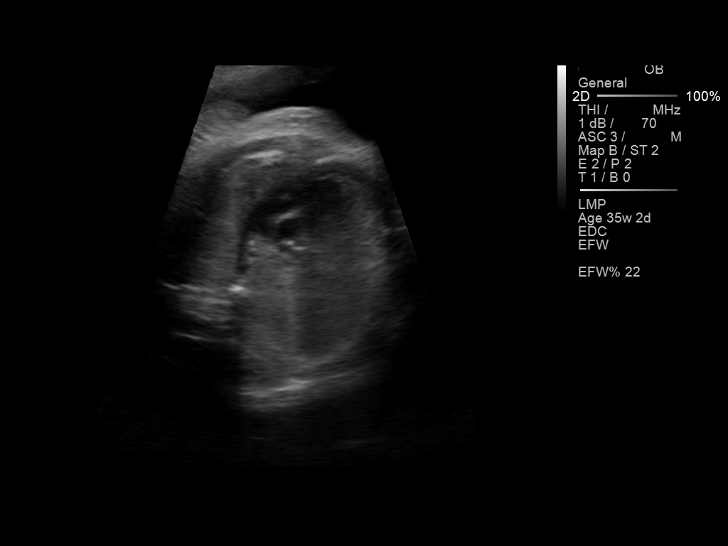
[im 41/48]
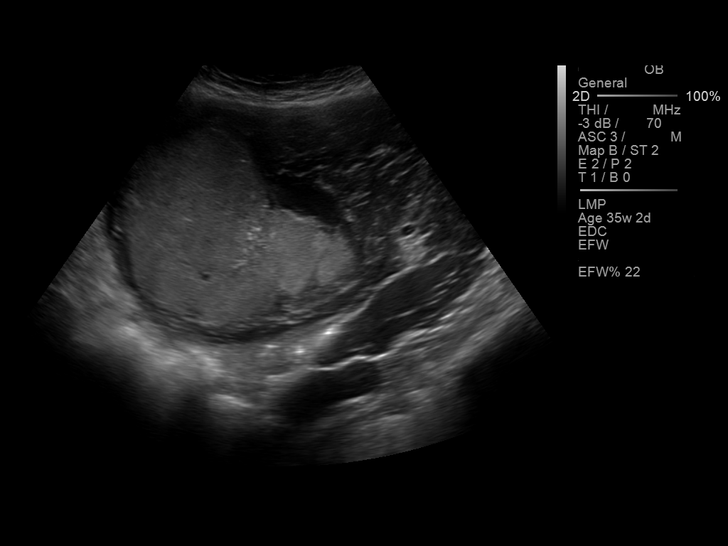
[im 44/48]
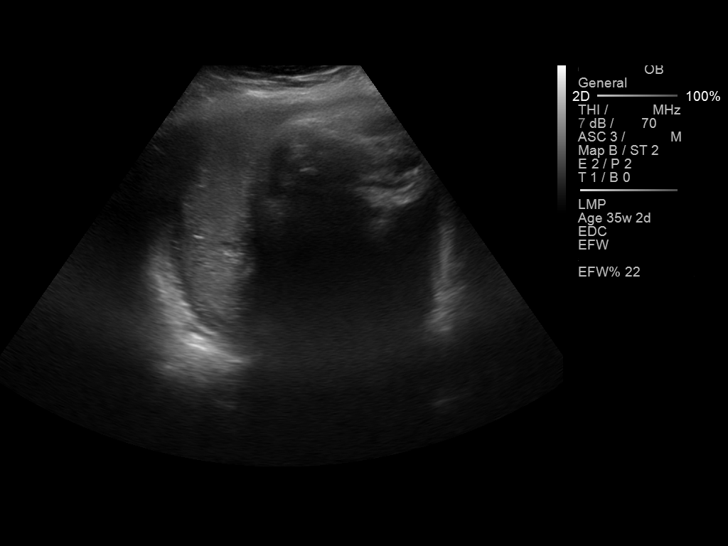
[im 48/48]
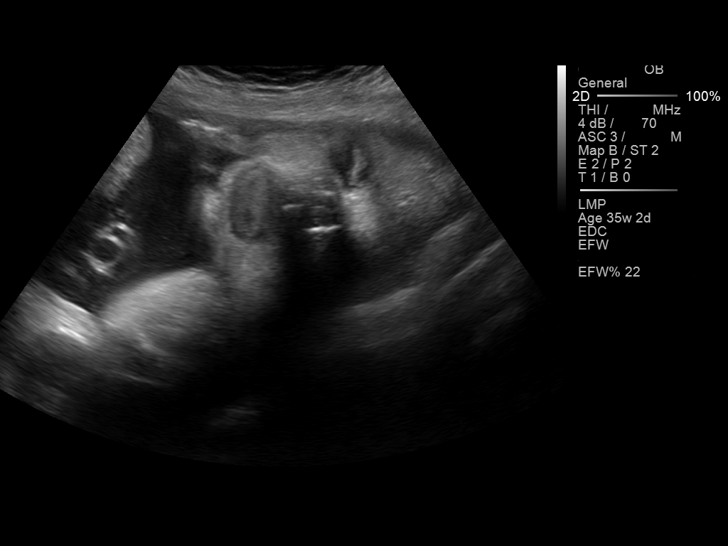

[14 of 28 positions shown; findings below may reference images not displayed]

Canned report from images found in remote index.

Refer to host system for actual result text.

## 2018-01-01 ENCOUNTER — Other Ambulatory Visit: Payer: Self-pay

## 2018-01-01 ENCOUNTER — Observation Stay
Admission: EM | Admit: 2018-01-01 | Discharge: 2018-01-01 | Disposition: A | Discharge status: Home / Self Care | Payer: Self-pay | Attending: Certified Nurse Midwife | Admitting: Certified Nurse Midwife

## 2018-01-01 ENCOUNTER — Encounter: Payer: Self-pay | Admitting: Emergency Medicine

## 2018-01-01 ENCOUNTER — Observation Stay: Payer: Self-pay

## 2018-01-01 ENCOUNTER — Emergency Department: Payer: Self-pay

## 2018-01-01 DIAGNOSIS — O99512 Diseases of the respiratory system complicating pregnancy, second trimester: Secondary | ICD-10-CM | POA: Insufficient documentation

## 2018-01-01 DIAGNOSIS — O99332 Smoking (tobacco) complicating pregnancy, second trimester: Secondary | ICD-10-CM | POA: Insufficient documentation

## 2018-01-01 DIAGNOSIS — R109 Unspecified abdominal pain: Secondary | ICD-10-CM

## 2018-01-01 DIAGNOSIS — O26892 Other specified pregnancy related conditions, second trimester: Principal | ICD-10-CM | POA: Insufficient documentation

## 2018-01-01 DIAGNOSIS — Z79899 Other long term (current) drug therapy: Secondary | ICD-10-CM | POA: Insufficient documentation

## 2018-01-01 DIAGNOSIS — O26899 Other specified pregnancy related conditions, unspecified trimester: Secondary | ICD-10-CM | POA: Diagnosis present

## 2018-01-01 DIAGNOSIS — Z3A25 25 weeks gestation of pregnancy: Secondary | ICD-10-CM | POA: Insufficient documentation

## 2018-01-01 DIAGNOSIS — J45909 Unspecified asthma, uncomplicated: Secondary | ICD-10-CM | POA: Insufficient documentation

## 2018-01-01 DIAGNOSIS — F17200 Nicotine dependence, unspecified, uncomplicated: Secondary | ICD-10-CM | POA: Insufficient documentation

## 2018-01-01 DIAGNOSIS — R1011 Right upper quadrant pain: Secondary | ICD-10-CM

## 2018-01-01 DIAGNOSIS — O0930 Supervision of pregnancy with insufficient antenatal care, unspecified trimester: Secondary | ICD-10-CM

## 2018-01-01 LAB — DIFFERENTIAL
Abs Immature Granulocytes: 0.1 10*3/uL — ABNORMAL HIGH (ref 0.00–0.07)
Basophils Absolute: 0 10*3/uL (ref 0.0–0.1)
Basophils Relative: 0 %
Eosinophils Absolute: 0 10*3/uL (ref 0.0–0.5)
Eosinophils Relative: 0 %
Immature Granulocytes: 1 %
Lymphocytes Relative: 22 %
Lymphs Abs: 1.9 10*3/uL (ref 0.7–4.0)
MONO ABS: 0.7 10*3/uL (ref 0.1–1.0)
MONOS PCT: 8 %
NEUTROS ABS: 6.2 10*3/uL (ref 1.7–7.7)
NEUTROS PCT: 69 %

## 2018-01-01 LAB — CBC
HCT: 36.8 % (ref 36.0–46.0)
Hemoglobin: 12.5 g/dL (ref 12.0–15.0)
MCH: 29.8 pg (ref 26.0–34.0)
MCHC: 34 g/dL (ref 30.0–36.0)
MCV: 87.6 fL (ref 80.0–100.0)
NRBC: 0 % (ref 0.0–0.2)
Platelets: 173 10*3/uL (ref 150–400)
RBC: 4.2 MIL/uL (ref 3.87–5.11)
RDW: 13.2 % (ref 11.5–15.5)
WBC: 9 10*3/uL (ref 4.0–10.5)

## 2018-01-01 LAB — CHLAMYDIA/NGC RT PCR (ARMC ONLY)
CHLAMYDIA TR: NOT DETECTED
N gonorrhoeae: NOT DETECTED

## 2018-01-01 LAB — URINE DRUG SCREEN, QUALITATIVE (ARMC ONLY)
Amphetamines, Ur Screen: NOT DETECTED
BARBITURATES, UR SCREEN: NOT DETECTED
Benzodiazepine, Ur Scrn: NOT DETECTED
CANNABINOID 50 NG, UR ~~LOC~~: NOT DETECTED
Cocaine Metabolite,Ur ~~LOC~~: NOT DETECTED
MDMA (Ecstasy)Ur Screen: NOT DETECTED
METHADONE SCREEN, URINE: NOT DETECTED
Opiate, Ur Screen: NOT DETECTED
Phencyclidine (PCP) Ur S: NOT DETECTED
TRICYCLIC, UR SCREEN: NOT DETECTED

## 2018-01-01 LAB — URINALYSIS, COMPLETE (UACMP) WITH MICROSCOPIC
BACTERIA UA: NONE SEEN
BILIRUBIN URINE: NEGATIVE
Glucose, UA: NEGATIVE mg/dL
HGB URINE DIPSTICK: NEGATIVE
Ketones, ur: NEGATIVE mg/dL
LEUKOCYTES UA: NEGATIVE
NITRITE: NEGATIVE
PROTEIN: NEGATIVE mg/dL
Specific Gravity, Urine: 1.013 (ref 1.005–1.030)
pH: 6 (ref 5.0–8.0)

## 2018-01-01 LAB — COMPREHENSIVE METABOLIC PANEL
ALBUMIN: 3.1 g/dL — AB (ref 3.5–5.0)
ALT: 12 U/L (ref 0–44)
ANION GAP: 6 (ref 5–15)
AST: 14 U/L — ABNORMAL LOW (ref 15–41)
Alkaline Phosphatase: 59 U/L (ref 38–126)
BUN: 9 mg/dL (ref 6–20)
CO2: 25 mmol/L (ref 22–32)
Calcium: 8.4 mg/dL — ABNORMAL LOW (ref 8.9–10.3)
Chloride: 109 mmol/L (ref 98–111)
Creatinine, Ser: 0.52 mg/dL (ref 0.44–1.00)
GFR calc non Af Amer: >60 mL/min (ref >60)
GLUCOSE: 78 mg/dL (ref 70–99)
POTASSIUM: 3.6 mmol/L (ref 3.5–5.1)
Sodium: 140 mmol/L (ref 135–145)
Total Bilirubin: 0.4 mg/dL (ref 0.3–1.2)
Total Protein: 6.5 g/dL (ref 6.5–8.1)

## 2018-01-01 LAB — OB RESULTS CONSOLE GC/CHLAMYDIA
Chlamydia: NEGATIVE
Gonorrhea: NEGATIVE

## 2018-01-01 LAB — OB RESULTS CONSOLE VARICELLA ZOSTER ANTIBODY, IGG
VARICELLA IGG: IMMUNE
Varicella: IMMUNE

## 2018-01-01 LAB — OB RESULTS CONSOLE HEPATITIS B SURFACE ANTIGEN: Hepatitis B Surface Ag: NEGATIVE

## 2018-01-01 LAB — RAPID HIV SCREEN (HIV 1/2 AB+AG)
HIV 1/2 Antibodies: NONREACTIVE
HIV-1 P24 ANTIGEN - HIV24: NONREACTIVE

## 2018-01-01 LAB — OB RESULTS CONSOLE RUBELLA ANTIBODY, IGM
Rubella: IMMUNE
Rubella: IMMUNE

## 2018-01-01 LAB — POCT PREGNANCY, URINE: PREG TEST UR: POSITIVE — AB

## 2018-01-01 LAB — HEPATITIS B SURFACE ANTIGEN: Hepatitis B Surface Ag: NEGATIVE

## 2018-01-01 LAB — OB RESULTS CONSOLE HIV ANTIBODY (ROUTINE TESTING): HIV: NONREACTIVE

## 2018-01-01 MED ORDER — ACETAMINOPHEN 325 MG PO TABS: 650.0000 mg | ORAL_TABLET | ORAL | Status: DC | PRN

## 2018-01-01 MED ORDER — PRENATAL MULTIVITAMIN CH: 1.0000 | ORAL_TABLET | Freq: Every day | ORAL | Status: DC

## 2018-01-01 NOTE — ED Notes (Addendum)
Right sided abd pain for 3 weeks now, is pregnant, does not know how far along, according to wheel, pt around 26 weeks, 3 days pregnant. No prenatal treatment.

## 2018-01-01 NOTE — Progress Notes (Signed)
Tonya Nichols is a 32 y.o. female. She is at [redacted]w[redacted]d gestation by ultrasound today. Patient's last menstrual period was 06/29/2017 (approximate). Estimated Date of Delivery: 04/10/18  Prenatal care site: none  Chief complaint: Right sided abdominal pain Location: RUQ Onset/timing: about three weeks ago Duration: initially intermittent, now consatnt Quality: dull at rest, sharp with movement Severity: 10/10 at its worst Aggravating or alleviating conditions: Associated signs/symptoms: none Context: Tonya Nichols reports a R sided/RUQ abdominal pain that started about 3 weeks ago. When it first started, it would come and go, but it has gotten worse over time and is now constant, though waxes and wanes in intensity. It is worse with changing positions, coughing, sneezing, and similar movements.   S: Resting comfortably.   She reports:  -active fetal movement -no leakage of fluid -no vaginal bleeding -no contractions  Maternal Medical History:   Past Medical History:  Diagnosis Date  . Alcohol abuse   . Asthma    as child  . Chlamydia   . Chlamydia   . Cocaine use complicating pregnancy   . Gonorrhea   . 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 History of HPV infection   . Insufficient prenatal care   . Marijuana abuse   . UTI (lower urinary tract infection)   . Vaginal Pap smear with ASC-US     Past Surgical History:  Procedure Laterality Date  . NO PAST SURGERIES      No Known Allergies  Prior to Admission medications   Medication Sig Start Date End Date Taking? Authorizing Provider  acetaminophen (TYLENOL) 325 MG tablet Take 2 tablets (650 mg total) by mouth every 4 (four) hours as needed (for pain scale < 4ORtemperature>/=100.5 F). Patient not taking: Reported on 01/01/2018 10/04/14   Nichols, Tonya Fender, MD  ibuprofen (ADVIL,MOTRIN) 600 MG tablet Take 1 tablet (600 mg total) by mouth every 6 (six) hours. Patient not taking: Reported on 01/01/2018 10/04/14   Nichols, Tonya Fender, MD   ibuprofen (ADVIL,MOTRIN) 600 MG tablet Take 1 tablet (600 mg total) by mouth every 6 (six) hours as needed. Patient not taking: Reported on 01/01/2018 09/13/16   Tonya Reining, PA-C  medroxyPROGESTERone (DEPO-PROVERA) 150 MG/ML injection Inject 1 mL (150 mg total) into the muscle once. Patient not taking: Reported on 01/01/2018 10/04/14   Nichols, Tonya Fender, MD  nicotine (NICODERM CQ - DOSED IN MG/24 HOURS) 14 mg/24hr patch Place 1 patch (14 mg total) onto the skin daily. Patient not taking: Reported on 01/01/2018 10/04/14   Nichols, Tonya Fender, MD  oxyCODONE-acetaminophen (ROXICET) 5-325 MG tablet Take 1 tablet by mouth every 6 (six) hours as needed for moderate pain. Patient not taking: Reported on 01/01/2018 09/13/16   Tonya Reining, PA-C  Prenatal Vit-Fe Fumarate-FA (PRENATAL MULTIVITAMIN) TABS Take 1 tablet by mouth daily.     [provider]  sertraline (ZOLOFT) 50 MG tablet Take 1 tablet (50 mg total) by mouth daily. Patient not taking: Reported on 01/01/2018 10/04/14   Nichols, Tonya Fender, MD  sulfamethoxazole-trimethoprim (BACTRIM DS,SEPTRA DS) 800-160 MG tablet Take 1 tablet by mouth 2 (two) times daily. Patient not taking: Reported on 01/01/2018 09/13/16   Tonya Reining, PA-C     Social History: She  reports that she has been smoking. She has a 7.00 pack-year smoking history. She has never used smokeless tobacco. She reports that she drank alcohol. She reports that she has current or past drug history. Drugs: Cocaine, Marijuana, and Other-see comments.  Family History:  no  history of gyn cancers  Review of Systems: A full review of systems was performed and negative except as noted in the HPI.    O:  BP (!) 117/55 (BP Location: Right Arm)   Pulse 66   Temp 98.1 F (36.7 C) (Oral)   Resp 16   Ht 5\' 3"  (1.6 m)   Wt 87.1 kg   LMP 06/29/2017 (Approximate)   SpO2 98%   BMI 34.01 kg/m  Results for orders placed or performed during the hospital encounter of 01/01/18 (from the  past 48 hour(s))  Pregnancy, urine POC   Collection Time: 01/01/18 10:29 AM  Result Value Ref Range   Preg Test, Ur POSITIVE (A) NEGATIVE  Urinalysis, Complete w Microscopic   Collection Time: 01/01/18  1:29 PM  Result Value Ref Range   Color, Urine YELLOW (A) YELLOW   APPearance CLEAR (A) CLEAR   Specific Gravity, Urine 1.013 1.005 - 1.030   pH 6.0 5.0 - 8.0   Glucose, UA NEGATIVE NEGATIVE mg/dL   Hgb urine dipstick NEGATIVE NEGATIVE   Bilirubin Urine NEGATIVE NEGATIVE   Ketones, ur NEGATIVE NEGATIVE mg/dL   Protein, ur NEGATIVE NEGATIVE mg/dL   Nitrite NEGATIVE NEGATIVE   Leukocytes, UA NEGATIVE NEGATIVE   RBC / HPF 0-5 0 - 5 RBC/hpf   WBC, UA 0-5 0 - 5 WBC/hpf   Bacteria, UA NONE SEEN NONE SEEN   Squamous Epithelial / LPF 0-5 0 - 5  Urine Drug Screen, Qualitative ([facility] only)   Collection Time: 01/01/18  1:29 PM  Result Value Ref Range   Tricyclic, Ur Screen NONE DETECTED NONE DETECTED   Amphetamines, Ur Screen NONE DETECTED NONE DETECTED   MDMA (Ecstasy)Ur Screen NONE DETECTED NONE DETECTED   Cocaine Metabolite,Ur [place] NONE DETECTED NONE DETECTED   Opiate, Ur Screen NONE DETECTED NONE DETECTED   Phencyclidine (PCP) Ur S NONE DETECTED NONE DETECTED   Cannabinoid 50 Ng, Ur [place] NONE DETECTED NONE DETECTED   Barbiturates, Ur Screen NONE DETECTED NONE DETECTED   Benzodiazepine, Ur Scrn NONE DETECTED NONE DETECTED   Methadone Scn, Ur NONE DETECTED NONE DETECTED  CBC   Collection Time: 01/01/18  1:31 PM  Result Value Ref Range   WBC 9.0 4.0 - 10.5 K/uL   RBC 4.20 3.87 - 5.11 MIL/uL   Hemoglobin 12.5 12.0 - 15.0 g/dL   HCT 16.1 09.6 - 04.5 %   MCV 87.6 80.0 - 100.0 fL   MCH 29.8 26.0 - 34.0 pg   MCHC 34.0 30.0 - 36.0 g/dL   RDW 40.9 81.1 - 91.4 %   Platelets 173 150 - 400 K/uL   nRBC 0.0 0.0 - 0.2 %  Differential   Collection Time: 01/01/18  1:31 PM  Result Value Ref Range   Neutrophils Relative % 69 %   Neutro Abs 6.2 1.7 - 7.7 K/uL   Lymphocytes Relative 22 %   Lymphs  Abs 1.9 0.7 - 4.0 K/uL   Monocytes Relative 8 %   Monocytes Absolute 0.7 0.1 - 1.0 K/uL   Eosinophils Relative 0 %   Eosinophils Absolute 0.0 0.0 - 0.5 K/uL   Basophils Relative 0 %   Basophils Absolute 0.0 0.0 - 0.1 K/uL   Immature Granulocytes 1 %   Abs Immature Granulocytes 0.10 (H) 0.00 - 0.07 K/uL  Rapid HIV screen (HIV 1/2 Ab+Ag)   Collection Time: 01/01/18  1:31 PM  Result Value Ref Range   HIV-1 P24 Antigen - HIV24 NON REACTIVE NON REACTIVE   HIV  1/2 Antibodies NON REACTIVE NON REACTIVE   Interpretation (HIV Ag Ab)      A non reactive test result means that HIV 1 or HIV 2 antibodies and HIV 1 p24 antigen were not detected in the specimen.  Type and screen [facility]   Collection Time: 01/01/18  1:31 PM  Result Value Ref Range   ABO/RH(D) A NEG    Antibody Screen NEG    Sample Expiration      01/04/2018 Performed at [facility], [address] [place], [place] [address]      Constitutional: NAD, AAOx3  HE/ENT: extraocular movements grossly intact, moist mucous membranes CV: RRR PULM: normal respiratory effort, CTABL     Abd: gravid, mild RUQ tenderness, non-distended, soft      Ext: Non-tender, Nonedmeatous   Psych: mood appropriate, speech normal Pelvic deferred  NST:  Baseline: 140bpm Variability: moderate Accelerations: one 10x10 present Decelerations: absent Time: Toco: quiet   A/P: 32 y.o. [redacted]w[redacted]d here for antenatal surveillance during pregnancy.  Principle diagnosis: abdominal pain during second trimester of pregnancy  Labor:  Not present  Fetal Wellbeing:  Non-reactive NST, reassuring for GA  Anatomy ultrasound: SIUP, FHR 150bpm, S=[redacted]w[redacted]d EDD 04/12/2018 CWD, no previa, placenta fundal, AFI subjectively WNL, cephalic, visualized anatomy WNL, limited heart views due to fetal position, female sex, cervical length 3.6cm  Routine prenatal care:  Routine prenatal labs drawn, all resulted WNL  RUQ pain:  RUQ  ultrasound ordered to assess gallbladder. Unable to complete at time of anatomy ultrasound as patient had recently eaten. Patient left AMA due to childcare issues before fasting RUQ ultrasound could be performed.   CMP WNL  Left AMA, prior to completion of work-up. Per nursing, patient is able to return for ultrasound. She also desires to establish prenatal care at [facility].    Genia Del 01/01/2018 4:14 PM  ----- Genia Del, CNM Certified Nurse Midwife [facility], Department of OB/GYN [facility]

## 2018-01-01 NOTE — OB Triage Note (Signed)
Pt is a G9P8 at [redacted]w[redacted]d with c/o abdominal pain consistently for 3 weeks. Pt states the pain is on her right side and intensifies when sneezing/coughing/ moving etc. Pt states it is a dull pain that in constant and becomes sharp with movement. Pt states positive fetal movement. Pt denies LOF and vaginal bleeding. Pt denies CTX. Pt stated she had intercourse this morning. Initial FHT 145.

## 2018-01-01 NOTE — Progress Notes (Signed)
Pt decided to leave AMA d/t obligations with children and the availability of her ride. Pt was dressed and stating she had to be somewhere at 1530. Pt was asked to stay and wait a little longer and refused but stated she wants to come back tomorrow after 11:30AM for the [place]. Provider was notified of the situation. Pt signed the AMA form and was waiting in the hall when nurse returned.

## 2018-01-01 NOTE — ED Triage Notes (Signed)
Pt here with c/o right mid abd pain that began about 3 weeks ago. According to pregnancy wheel, pt is about [redacted] weeks pregnant, has had no prenatal care. Is feeling baby move, this is number 9, is Rh negative.

## 2018-01-02 LAB — RUBELLA SCREEN: Rubella: 1.51 index (ref >0.99)

## 2018-01-02 LAB — RPR: RPR: NONREACTIVE

## 2018-01-02 LAB — URINE CULTURE

## 2018-01-02 LAB — TYPE AND SCREEN
ABO/RH(D): A NEG
ANTIBODY SCREEN: NEGATIVE

## 2018-01-02 LAB — VARICELLA ZOSTER ANTIBODY, IGG: Varicella IgG: 985 index (ref >165)

## 2018-03-06 NOTE — L&D Delivery Note (Signed)
Date of delivery: 04/10/2018 Estimated Date of Delivery: 04/10/18 Patient's last menstrual period was 06/29/2017 (approximate). EGA: [redacted]w[redacted]d  Delivery Note At 11:28 AM a viable female was delivered via Vaginal, Spontaneous (Presentation: cephalic; ROA).  APGAR: 8, 9; weight 2960g (6lb, 8oz).   Placenta status: spontaneous, intact with marginal cord insertion.  Cord: 3vv, with the following complications: nuchal cord x1, loose.  Cord pH: not collected  Anesthesia:  none Episiotomy:  none Lacerations:  none Suture Repair: n/a Est. Blood Loss (mL):  170cc (measured)   Mom presented to L&D with spontaneous labor, spontaneously ruptured for clear fluid. Progressed to complete, second stage: one set of pushes.  delivery of fetal head with restitution to ROT.   Anterior then posterior shoulders delivered without difficulty.  Baby placed on mom's chest, and attended to by peds. Cord was then clamped and cut when pulseless.  Placenta spontaneously delivered, intact.   IV pitocin given for hemorrhage prophylaxis.   We sang happy birthday to baby Eliberto Ivory.    Mom to postpartum.  Baby to Couplet care / Skin to Skin.  Chelsea C Ward 04/10/2018, 11:49 AM

## 2018-03-19 ENCOUNTER — Encounter: Payer: Self-pay | Admitting: Family Medicine

## 2018-03-19 DIAGNOSIS — Z6791 Unspecified blood type, Rh negative: Secondary | ICD-10-CM | POA: Insufficient documentation

## 2018-03-19 DIAGNOSIS — O26899 Other specified pregnancy related conditions, unspecified trimester: Secondary | ICD-10-CM | POA: Insufficient documentation

## 2018-03-19 DIAGNOSIS — O0930 Supervision of pregnancy with insufficient antenatal care, unspecified trimester: Secondary | ICD-10-CM | POA: Insufficient documentation

## 2018-04-10 ENCOUNTER — Other Ambulatory Visit: Payer: Self-pay

## 2018-04-10 ENCOUNTER — Inpatient Hospital Stay
Admission: EM | Admit: 2018-04-10 | Discharge: 2018-04-12 | DRG: 806 | Disposition: A | Discharge status: Home / Self Care | Payer: Medicaid Other | Attending: Obstetrics & Gynecology | Admitting: Obstetrics & Gynecology

## 2018-04-10 DIAGNOSIS — Z3A40 40 weeks gestation of pregnancy: Secondary | ICD-10-CM

## 2018-04-10 DIAGNOSIS — F1490 Cocaine use, unspecified, uncomplicated: Secondary | ICD-10-CM | POA: Diagnosis present

## 2018-04-10 DIAGNOSIS — F17210 Nicotine dependence, cigarettes, uncomplicated: Secondary | ICD-10-CM | POA: Diagnosis present

## 2018-04-10 DIAGNOSIS — F141 Cocaine abuse: Secondary | ICD-10-CM

## 2018-04-10 DIAGNOSIS — O26893 Other specified pregnancy related conditions, third trimester: Secondary | ICD-10-CM | POA: Diagnosis present

## 2018-04-10 DIAGNOSIS — O26899 Other specified pregnancy related conditions, unspecified trimester: Secondary | ICD-10-CM

## 2018-04-10 DIAGNOSIS — Z6791 Unspecified blood type, Rh negative: Secondary | ICD-10-CM

## 2018-04-10 DIAGNOSIS — O99334 Smoking (tobacco) complicating childbirth: Secondary | ICD-10-CM | POA: Diagnosis present

## 2018-04-10 DIAGNOSIS — O43123 Velamentous insertion of umbilical cord, third trimester: Secondary | ICD-10-CM | POA: Diagnosis present

## 2018-04-10 DIAGNOSIS — O99324 Drug use complicating childbirth: Secondary | ICD-10-CM | POA: Diagnosis present

## 2018-04-10 DIAGNOSIS — O0933 Supervision of pregnancy with insufficient antenatal care, third trimester: Secondary | ICD-10-CM

## 2018-04-10 DIAGNOSIS — Z3483 Encounter for supervision of other normal pregnancy, third trimester: Secondary | ICD-10-CM | POA: Diagnosis present

## 2018-04-10 LAB — CBC
HCT: 38 % (ref 36.0–46.0)
Hemoglobin: 12.7 g/dL (ref 12.0–15.0)
MCH: 28.8 pg (ref 26.0–34.0)
MCHC: 33.4 g/dL (ref 30.0–36.0)
MCV: 86.2 fL (ref 80.0–100.0)
PLATELETS: 180 10*3/uL (ref 150–400)
RBC: 4.41 MIL/uL (ref 3.87–5.11)
RDW: 14.4 % (ref 11.5–15.5)
WBC: 16.1 10*3/uL — ABNORMAL HIGH (ref 4.0–10.5)
nRBC: 0 % (ref 0.0–0.2)

## 2018-04-10 LAB — URINE DRUG SCREEN, QUALITATIVE (ARMC ONLY)
Amphetamines, Ur Screen: NOT DETECTED
Barbiturates, Ur Screen: NOT DETECTED
Benzodiazepine, Ur Scrn: NOT DETECTED
Cannabinoid 50 Ng, Ur ~~LOC~~: NOT DETECTED
Cocaine Metabolite,Ur ~~LOC~~: NOT DETECTED
MDMA (Ecstasy)Ur Screen: NOT DETECTED
Methadone Scn, Ur: NOT DETECTED
Opiate, Ur Screen: NOT DETECTED
Phencyclidine (PCP) Ur S: NOT DETECTED
TRICYCLIC, UR SCREEN: NOT DETECTED

## 2018-04-10 LAB — GROUP B STREP BY PCR: Group B strep by PCR: POSITIVE — AB

## 2018-04-10 MED ORDER — OXYTOCIN 10 UNIT/ML IJ SOLN
INTRAMUSCULAR | Status: AC
  Filled 2018-04-10: qty 2

## 2018-04-10 MED ORDER — SIMETHICONE 80 MG PO CHEW: 80.0000 mg | CHEWABLE_TABLET | ORAL | Status: DC | PRN

## 2018-04-10 MED ORDER — DOCUSATE SODIUM 100 MG PO CAPS
100.0000 mg | ORAL_CAPSULE | Freq: Two times a day (BID) | ORAL | Status: DC
  Administered 2018-04-10 – 2018-04-11 (×3): 100 mg via ORAL
  Filled 2018-04-10 (×3): qty 1

## 2018-04-10 MED ORDER — LACTATED RINGERS IV SOLN
INTRAVENOUS | Status: DC
  Administered 2018-04-10: 09:00:00 via INTRAVENOUS

## 2018-04-10 MED ORDER — ACETAMINOPHEN 500 MG PO TABS
1000.0000 mg | ORAL_TABLET | Freq: Four times a day (QID) | ORAL | Status: DC | PRN
  Administered 2018-04-10 – 2018-04-12 (×7): 1000 mg via ORAL
  Filled 2018-04-10 (×7): qty 2

## 2018-04-10 MED ORDER — OXYTOCIN 40 UNITS IN NORMAL SALINE INFUSION - SIMPLE MED
INTRAVENOUS | Status: AC
  Administered 2018-04-10: 500 mL via INTRAVENOUS
  Filled 2018-04-10: qty 1000

## 2018-04-10 MED ORDER — BUTORPHANOL TARTRATE 2 MG/ML IJ SOLN
1.0000 mg | INTRAMUSCULAR | Status: DC | PRN
  Administered 2018-04-10 (×2): 1 mg via INTRAVENOUS
  Filled 2018-04-10 (×2): qty 1

## 2018-04-10 MED ORDER — ONDANSETRON HCL 4 MG/2ML IJ SOLN: 4.0000 mg | INTRAMUSCULAR | Status: DC | PRN

## 2018-04-10 MED ORDER — WITCH HAZEL-GLYCERIN EX PADS
1.0000 "application " | MEDICATED_PAD | CUTANEOUS | Status: DC
  Administered 2018-04-10: 1 via TOPICAL
  Filled 2018-04-10: qty 100

## 2018-04-10 MED ORDER — IBUPROFEN 600 MG PO TABS
600.0000 mg | ORAL_TABLET | Freq: Four times a day (QID) | ORAL | Status: DC
  Administered 2018-04-10 – 2018-04-12 (×8): 600 mg via ORAL
  Filled 2018-04-10 (×8): qty 1

## 2018-04-10 MED ORDER — MISOPROSTOL 200 MCG PO TABS
ORAL_TABLET | ORAL | Status: AC
  Filled 2018-04-10: qty 4

## 2018-04-10 MED ORDER — OXYTOCIN 40 UNITS IN NORMAL SALINE INFUSION - SIMPLE MED
2.5000 [IU]/h | INTRAVENOUS | Status: DC
  Administered 2018-04-10: 2.5 [IU]/h via INTRAVENOUS

## 2018-04-10 MED ORDER — MEDROXYPROGESTERONE ACETATE 150 MG/ML IM SUSP
150.0000 mg | INTRAMUSCULAR | Status: AC | PRN
  Administered 2018-04-12: 150 mg via INTRAMUSCULAR
  Filled 2018-04-10: qty 1

## 2018-04-10 MED ORDER — COCONUT OIL OIL: 1.0000 "application " | TOPICAL_OIL | Status: DC | PRN

## 2018-04-10 MED ORDER — AMMONIA AROMATIC IN INHA
RESPIRATORY_TRACT | Status: AC
  Filled 2018-04-10: qty 10

## 2018-04-10 MED ORDER — LACTATED RINGERS IV SOLN: 500.0000 mL | INTRAVENOUS | Status: DC | PRN

## 2018-04-10 MED ORDER — ONDANSETRON HCL 4 MG/2ML IJ SOLN: 4.0000 mg | Freq: Four times a day (QID) | INTRAMUSCULAR | Status: DC | PRN

## 2018-04-10 MED ORDER — ACETAMINOPHEN 500 MG PO TABS: 1000.0000 mg | ORAL_TABLET | Freq: Four times a day (QID) | ORAL | Status: DC | PRN

## 2018-04-10 MED ORDER — OXYTOCIN BOLUS FROM INFUSION
500.0000 mL | Freq: Once | INTRAVENOUS | Status: AC
  Administered 2018-04-10: 500 mL via INTRAVENOUS

## 2018-04-10 MED ORDER — SOD CITRATE-CITRIC ACID 500-334 MG/5ML PO SOLN: 30.0000 mL | ORAL | Status: DC | PRN

## 2018-04-10 MED ORDER — PRENATAL MULTIVITAMIN CH
1.0000 | ORAL_TABLET | Freq: Every day | ORAL | Status: DC
  Administered 2018-04-10 – 2018-04-11 (×2): 1 via ORAL
  Filled 2018-04-10 (×2): qty 1

## 2018-04-10 MED ORDER — LIDOCAINE HCL (PF) 1 % IJ SOLN: 30.0000 mL | INTRAMUSCULAR | Status: DC | PRN

## 2018-04-10 MED ORDER — ONDANSETRON HCL 4 MG PO TABS: 4.0000 mg | ORAL_TABLET | ORAL | Status: DC | PRN

## 2018-04-10 MED ORDER — SODIUM CHLORIDE 0.9 % IV SOLN: 1.0000 g | INTRAVENOUS | Status: DC

## 2018-04-10 MED ORDER — LIDOCAINE HCL (PF) 1 % IJ SOLN
INTRAMUSCULAR | Status: AC
  Filled 2018-04-10: qty 30

## 2018-04-10 MED ORDER — BENZOCAINE-MENTHOL 20-0.5 % EX AERO
1.0000 "application " | INHALATION_SPRAY | CUTANEOUS | Status: DC | PRN
  Administered 2018-04-10: 1 via TOPICAL
  Filled 2018-04-10: qty 56

## 2018-04-10 MED ORDER — SODIUM CHLORIDE 0.9 % IV SOLN
2.0000 g | Freq: Once | INTRAVENOUS | Status: AC
  Administered 2018-04-10: 2 g via INTRAVENOUS
  Filled 2018-04-10: qty 2000

## 2018-04-10 MED ORDER — DIPHENHYDRAMINE HCL 25 MG PO CAPS: 25.0000 mg | ORAL_CAPSULE | Freq: Four times a day (QID) | ORAL | Status: DC | PRN

## 2018-04-10 NOTE — Discharge Summary (Signed)
Obstetrical Discharge Summary  Patient Name: Tonya Nichols DOB: 06-Apr-1985 MRN: [id number]  Date of Admission: 04/10/2018 Date of Delivery: 04/10/2018 Delivered by: Ranae Plumber, MD  Date of Discharge: 04/12/2018  Primary OB: [facility]  VZD:[id number]'P last menstrual period was 06/29/2017 (approximate). EDC Estimated Date of Delivery: 04/10/18 Gestational Age at Delivery: [redacted]w[redacted]d   Antepartum complications:  1. Grand multipara  2. History of alcohol abuse 3. Drug use in pregnancy (cocaine) 4. Insufficient prenatal care 5. History of postpartum depression 6. Rh negative, s/p RhoGam  Admitting Diagnosis: labor Secondary Diagnosis: Patient Active Problem List   Diagnosis Date Noted  . Labor and delivery indication for care or intervention 04/10/2018  . No prenatal care in current pregnancy 03/19/2018  . Rh negative, antepartum 03/19/2018  . Abdominal pain affecting pregnancy 08/30/2014  . Alcohol abuse   . 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 Vaginal Pap smear with ASC-US   . Cocaine use complicating pregnancy   . Marijuana abuse   . 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 Progressed to complete, second stage: one set of pushes.  delivery of fetal head with restitution to ROT.   Anterior then posterior shoulders delivered without difficulty.  Baby placed on mom's chest, and attended to by peds. Cord was then clamped and cut when pulseless.  Placenta spontaneously delivered, intact.   IV pitocin given for hemorrhage prophylaxis. Date of Delivery: 04/10/2018 Delivered By: Leeroy Bock Ward Delivery Type: spontaneous vaginal delivery Anesthesia: none Placenta: spontaneous Laceration: none Episiotomy: none Newborn Data: Live born female  Birth Weight:  6lb 8.4oz APGAR: 8, 9  Newborn Delivery   Birth date/time:  04/10/2018  11:28:00 Delivery type:  Vaginal, Spontaneous     Postpartum Procedures: none  Post partum course:  Patient had an uncomplicated postpartum course.  By time of discharge on PPD#2, her pain was controlled on oral pain medications; she had appropriate lochia and was ambulating, voiding without difficulty and tolerating regular diet.  She was deemed stable for discharge to home.    Discharge Physical Exam:  BP 113/79 (BP Location: Left Arm)   Pulse 76   Temp 98.1 F (36.7 C) (Oral)   Resp 20   Ht 5\' 3"  (1.6 m)   Wt 86.6 kg   LMP 06/29/2017 (Approximate)   SpO2 99%   Breastfeeding Unknown   BMI 33.83 kg/m   General: NAD CV: RRR Pulm: CTABL, nl effort ABD: s/nd/nt, fundus firm and below the umbilicus Lochia: moderate DVT Evaluation LE non-ttp, no evidence of DVT on exam.  Hemoglobin  Date Value Ref Range Status  04/11/2018 11.4 (L) 12.0 - 15.0 g/dL Final   HGB  Date Value Ref Range Status  04/06/2013 11.9 (L) 12.0 - 16.0 g/dL Final   HCT  Date Value Ref Range Status  04/11/2018 34.9 (L) 36.0 - 46.0 % Final  04/07/2013 31.8 (L) 35.0 - 47.0 % Final     Disposition: stable, discharge to home. Baby Feeding: breastmilk and formula Baby Disposition: home with mom  Rh Immune globulin given: 04/11/2018 Rubella vaccine given: n/a Tdap vaccine given in AP or PP setting: AP Flu vaccine given in AP or PP setting: declined  Contraception: depo  Prenatal Labs:  Blood type/Rh A neg  Antibody screen neg  Rubella Immune  Varicella Immune  RPR NR  HBsAg Neg  HIV NR  GC neg  Chlamydia neg  Genetic screening Not done  1 hour GTT 120  3 hour GTT   GBS unknown      Plan:  Donisha L Edmondson was discharged to home in good condition. Follow-up appointment with delivering provider in 6 weeks.  Discharge Medications: Allergies as of 04/12/2018   No Known Allergies     Medication List    STOP taking these medications   oxyCODONE-acetaminophen 5-325 MG tablet Commonly  known as:  ROXICET   sulfamethoxazole-trimethoprim 800-160 MG tablet Commonly known as:  BACTRIM DS,SEPTRA DS     TAKE these medications   acetaminophen 325 MG tablet Commonly known as:  TYLENOL Take 2 tablets (650 mg total) by mouth every 4 (four) hours as needed for mild pain or moderate pain. What changed:  reasons to take this   ibuprofen 600 MG tablet Commonly known as:  ADVIL,MOTRIN Take 1 tablet (600 mg total) by mouth every 6 (six) hours as needed for mild pain, moderate pain or cramping. What changed:    when to take this  reasons to take this  Another medication with the same name was removed. Continue taking this medication, and follow the directions you see here.   medroxyPROGESTERone 150 MG/ML injection Commonly known as:  DEPO-PROVERA Inject 1 mL (150 mg total) into the muscle once.   nicotine 14 mg/24hr patch Commonly known as:  NICODERM CQ - dosed in mg/24 hours Place 1 patch (14 mg total) onto the skin daily.   prenatal multivitamin Tabs tablet Take 1 tablet by mouth daily.   sertraline 50 MG tablet Commonly known as:  ZOLOFT Take 1 tablet (50 mg total) by mouth daily.       Follow-up Information    Ward, Elenora Fender, MD Follow up in 5 week(s).   Specialty:  Obstetrics and Gynecology Contact information: [address] [place] [address] [phone]           Signed: Genia Del, CNM 04/12/2018 8:39 AM

## 2018-04-10 NOTE — H&P (Addendum)
OB History & Physical   History of Present Illness:  Chief Complaint:   HPI:  Tonya Nichols is a 33 y.o. [id number] female at [redacted]w[redacted]d dated by Patient's last menstrual period was 06/29/2017 (approximate). Estimated Date of Delivery: 04/10/18 She presents to L&D with contractions worsening and increasing frequency.   +FM, +CTX, no LOF, no VB  Pregnancy Issues: 1. Grand multipara  2. History of alcohol abuse 3. Drug use in pregnancy (cocaine) 4. Insufficient prenatal care 5. History of postpartum depression 6. Rh negative, s/p RhoGam  Maternal Medical History:   Past Medical History:  Diagnosis Date  . Alcohol abuse   . Asthma    as child  . Chlamydia   . Chlamydia   . Cocaine use complicating pregnancy   . Gonorrhea   . 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 History of HPV infection   . Insufficient prenatal care   . Marijuana abuse   . UTI (lower urinary tract infection)   . Vaginal Pap smear with ASC-US     Past Surgical History:  Procedure Laterality Date  . NO PAST SURGERIES      No Known Allergies  Prior to Admission medications   Medication Sig Start Date End Date Taking? Authorizing Provider  acetaminophen (TYLENOL) 325 MG tablet Take 2 tablets (650 mg total) by mouth every 4 (four) hours as needed (for pain scale < 4ORtemperature>/=100.5 F). 10/04/14  Yes Arella Blinder, Elenora Fender, MD  oxyCODONE-acetaminophen (ROXICET) 5-325 MG tablet Take 1 tablet by mouth every 6 (six) hours as needed for moderate pain. 09/13/16  Yes Joni Reining, PA-C  Prenatal Vit-Fe Fumarate-FA (PRENATAL MULTIVITAMIN) TABS Take 1 tablet by mouth daily.    Yes [provider]  ibuprofen (ADVIL,MOTRIN) 600 MG tablet Take 1 tablet (600 mg total) by mouth every 6 (six) hours. Patient not taking: Reported on 01/01/2018 10/04/14   Maiko Salais, Elenora Fender, MD  ibuprofen (ADVIL,MOTRIN) 600 MG tablet Take 1 tablet (600 mg total) by mouth every 6 (six) hours as needed. Patient not taking: Reported on 01/01/2018 09/13/16    Joni Reining, PA-C  medroxyPROGESTERone (DEPO-PROVERA) 150 MG/ML injection Inject 1 mL (150 mg total) into the muscle once. Patient not taking: Reported on 01/01/2018 10/04/14   Ileah Falkenstein, Elenora Fender, MD  nicotine (NICODERM CQ - DOSED IN MG/24 HOURS) 14 mg/24hr patch Place 1 patch (14 mg total) onto the skin daily. Patient not taking: Reported on 01/01/2018 10/04/14   Kyeisha Janowicz, Elenora Fender, MD  sertraline (ZOLOFT) 50 MG tablet Take 1 tablet (50 mg total) by mouth daily. Patient not taking: Reported on 01/01/2018 10/04/14   Amyria Komar, Elenora Fender, MD  sulfamethoxazole-trimethoprim (BACTRIM DS,SEPTRA DS) 800-160 MG tablet Take 1 tablet by mouth 2 (two) times daily. Patient not taking: Reported on 01/01/2018 09/13/16   Joni Reining, PA-C     Prenatal care site: [facility]   Social History: She  reports that she has been smoking. She has a 7.00 pack-year smoking history. She has never used smokeless tobacco. She reports previous alcohol use. She reports current drug use. Drugs: Cocaine, Marijuana, and Other-see comments.  Family History: no history of gyn cancers   Review of Systems: A full review of systems was performed and negative except as noted in the HPI.     Physical Exam:  Vital Signs: BP 123/73 (BP Location: Left Arm)   Pulse 89   Temp 98.1 F (36.7 C) (Oral)   Resp 16   Ht 5'  3" (1.6 m)   Wt 86.6 kg   LMP 06/29/2017 (Approximate)   BMI 33.83 kg/m  General: no acute distress.  HEENT: normocephalic, atraumatic Heart: regular rate & rhythm.  No murmurs/rubs/gallops Lungs: clear to auscultation bilaterally, normal respiratory effort Abdomen: soft, gravid, non-tender;  EFW: 6lbs 12oz Pelvic:   External: Normal external female genitalia  Cervix: Dilation: 4 / Effacement (%): 90 / Station: -2    Extremities: non-tender, symmetric, mild edema bilaterally.  DTRs: 2+ Neurologic: Alert & oriented x 3.    Results for orders placed or performed during  the hospital encounter of 04/10/18 (from the past 24 hour(s))  CBC     Status: Abnormal   Collection Time: 04/10/18  9:16 AM  Result Value Ref Range   WBC 16.1 (H) 4.0 - 10.5 K/uL   RBC 4.41 3.87 - 5.11 MIL/uL   Hemoglobin 12.7 12.0 - 15.0 g/dL   HCT 28.4 13.2 - 44.0 %   MCV 86.2 80.0 - 100.0 fL   MCH 28.8 26.0 - 34.0 pg   MCHC 33.4 30.0 - 36.0 g/dL   RDW 10.2 72.5 - 36.6 %   Platelets 180 150 - 400 K/uL   nRBC 0.0 0.0 - 0.2 %  Type and screen [facility]     Status: None (Preliminary result)   Collection Time: 04/10/18  9:16 AM  Result Value Ref Range   ABO/RH(D) A NEG    Antibody Screen POS    Sample Expiration 04/13/2018    Antibody Identification PASSIVELY ACQUIRED ANTI-D    Unit Number [id number]    Blood Component Type RED CELLS,LR    Unit division 00    Status of Unit ALLOCATED    Transfusion Status OK TO TRANSFUSE    Crossmatch Result      COMPATIBLE Performed at [facility], [address] [place], [place] [address]    Unit Number [id number]    Blood Component Type RED CELLS,LR    Unit division 00    Status of Unit ALLOCATED    Transfusion Status OK TO TRANSFUSE    Crossmatch Result COMPATIBLE   Group B strep by PCR     Status: Abnormal   Collection Time: 04/10/18  9:24 AM  Result Value Ref Range   Group B strep by PCR POSITIVE (A) NEGATIVE  Urine Drug Screen, Qualitative ([facility] only)     Status: None   Collection Time: 04/10/18 11:15 AM  Result Value Ref Range   Tricyclic, Ur Screen NONE DETECTED NONE DETECTED   Amphetamines, Ur Screen NONE DETECTED NONE DETECTED   MDMA (Ecstasy)Ur Screen NONE DETECTED NONE DETECTED   Cocaine Metabolite,Ur [place] NONE DETECTED NONE DETECTED   Opiate, Ur Screen NONE DETECTED NONE DETECTED   Phencyclidine (PCP) Ur S NONE DETECTED NONE DETECTED   Cannabinoid 50 Ng, Ur [place] NONE DETECTED NONE DETECTED   Barbiturates, Ur Screen NONE DETECTED NONE DETECTED   Benzodiazepine, Ur Scrn NONE DETECTED  NONE DETECTED   Methadone Scn, Ur NONE DETECTED NONE DETECTED    Pertinent Results:  Prenatal Labs: Blood type/Rh A neg  Antibody screen neg  Rubella Immune  Varicella Immune  RPR NR  HBsAg Neg  HIV NR  GC neg  Chlamydia neg  Genetic screening Not done  1 hour GTT 120  3 hour GTT   GBS unknown  S/p Tdap declines FLU  FHT: 125 mod + accels + decels - Variable x1 on arrival TOCO: q 2-51min SVE:  Dilation: 4 /  Effacement (%): 90 / Station: -2 per RN   Cephalic by [employer]    Assessment:  Tonya Nichols is a 33 y.o. [id number] female at 52102w0d with LABOR.   Plan:  1. Admit to Labor & Delivery 2. CBC, T&S, Clrs, IVF 3. GBS unknown - no risk factors.  Rapid PCR collected.  4. Consents obtained. 5. Continuous efm/toco 6. Category 2 tracing, reassuring with accels  7. Expectant management.  AROM when able.  ----- Ranae Plumberhelsea Joeann Steppe, MD Attending Obstetrician and Gynecologist [facility], Department of OB/GYN [facility]

## 2018-04-10 NOTE — OB Triage Note (Signed)
Pt G9P9 presents to L&D via EMS c/o contractions that started at midnight. Pt states she has contractions every 5-8 minutes at [place] medical [place]. Denies vaginal bleeding, leaking of fluid, or decreased fetal movement.  External monitors applied, VSS. Dr. Elesa Massed aware of pt.

## 2018-04-11 LAB — CBC
HCT: 34.9 % — ABNORMAL LOW (ref 36.0–46.0)
Hemoglobin: 11.4 g/dL — ABNORMAL LOW (ref 12.0–15.0)
MCH: 29 pg (ref 26.0–34.0)
MCHC: 32.7 g/dL (ref 30.0–36.0)
MCV: 88.8 fL (ref 80.0–100.0)
Platelets: 141 10*3/uL — ABNORMAL LOW (ref 150–400)
RBC: 3.93 MIL/uL (ref 3.87–5.11)
RDW: 14.5 % (ref 11.5–15.5)
WBC: 13.2 10*3/uL — ABNORMAL HIGH (ref 4.0–10.5)
nRBC: 0 % (ref 0.0–0.2)

## 2018-04-11 LAB — TYPE AND SCREEN
ABO/RH(D): A NEG
Antibody Screen: POSITIVE
Unit division: 0
Unit division: 0

## 2018-04-11 LAB — FETAL SCREEN: Fetal Screen: NEGATIVE

## 2018-04-11 LAB — RPR: RPR Ser Ql: NONREACTIVE

## 2018-04-11 LAB — BPAM RBC
Blood Product Expiration Date: 202003032359
Blood Product Expiration Date: 202003032359
Unit Type and Rh: 600
Unit Type and Rh: 600

## 2018-04-11 MED ORDER — NICOTINE 14 MG/24HR TD PT24
14.0000 mg | MEDICATED_PATCH | Freq: Every day | TRANSDERMAL | Status: DC
  Administered 2018-04-11: 14 mg via TRANSDERMAL
  Filled 2018-04-11: qty 1

## 2018-04-11 MED ORDER — SERTRALINE HCL 25 MG PO TABS
25.0000 mg | ORAL_TABLET | Freq: Every day | ORAL | Status: DC
  Administered 2018-04-11: 25 mg via ORAL
  Filled 2018-04-11: qty 1

## 2018-04-11 MED ORDER — RHO D IMMUNE GLOBULIN 1500 UNIT/2ML IJ SOSY
300.0000 ug | PREFILLED_SYRINGE | Freq: Once | INTRAMUSCULAR | Status: AC
  Administered 2018-04-11: 300 ug via INTRAVENOUS
  Filled 2018-04-11: qty 2

## 2018-04-11 NOTE — Progress Notes (Signed)
Post Partum Day 1 Subjective: Doing well, no complaints.  Tolerating regular diet, pain with PO meds, voiding and ambulating without difficulty.  No CP SOB Fever,Chills, N/V or leg pain; denies nipple or breast pain; no HA change of vision, RUQ/epigastric pain  Objective: BP 116/73 (BP Location: Right Arm)   Pulse 66   Temp 98.5 F (36.9 C) (Oral)   Resp 20   Ht 5\' 3"  (1.6 m)   Wt 86.6 kg   LMP 06/29/2017 (Approximate)   SpO2 99%   Breastfeeding Unknown   BMI 33.83 kg/m    Physical Exam:  General: NAD Breasts: soft/nontender CV: RRR Pulm: nl effort, CTABL Abdomen: soft, NT, BS x 4 Perineum: minimal edema, intact Lochia: moderate Uterine Fundus: fundus firm and 1 fb below umbilicus DVT Evaluation: no cords, ttp LEs   Recent Labs    04/10/18 0916 04/11/18 0508  HGB 12.7 11.4*  HCT 38.0 34.9*  WBC 16.1* 13.2*  PLT 180 141*    Assessment/Plan: 32 y.o. G9P9009 postpartum day # 1  - Continue routine PP care - encouraged snug fitting bra and cabbage leaves for bottlefeeding.  - Discussed contraceptive options including implant, IUDs hormonal and non-hormonal, injection, pills/ring/patch, condoms, and NFP. Desires Depo at DC - SW consult pending for hx substance use and no prenatal care. Pt requesting resources for transportation and f/u counseling. Desires to restart ZOloft now.  - Infant must remain inpatient x 48hrs due to Pos GBS by PCR and inadequate tx.   - Immunization status: Needs tdap and Flu offered prior to DC   Disposition: Does not desire Dc home today.     Randa Ngo, CNM 04/11/2018 10:07 AM

## 2018-04-11 NOTE — Clinical Social Work Maternal (Signed)
CLINICAL SOCIAL WORK MATERNAL/CHILD NOTE  Patient Details  Name: Tonya Nichols MRN: [id number] Date of Birth: 04-Aug-1985  Date:  04/11/2018  Clinical Social Worker Initiating Note:  York Spaniel MSW,LCSW Date/Time: Initiated:  04/11/18/      Child's Name:      Biological Parents:  Mother   Need for Interpreter:  None   Reason for Referral:      Address:  [address] [place] [address]    Phone number:  [phone] (home)     Additional phone number: none  Household Members/Support Persons (HM/SP):       HM/SP Name Relationship DOB or Age  HM/SP -1        HM/SP -2        HM/SP -3        HM/SP -4        HM/SP -5        HM/SP -6        HM/SP -7        HM/SP -8          Natural Supports (not living in the home):  Children, Spouse/significant other   Professional Supports:     Employment:     Type of Work:     Education:      Homebound arranged:    Surveyor, quantity Resources:  Self-Pay    Other Resources:      Cultural/Religious Considerations Which May Impact Care:  none  Strengths:  Ability to meet basic needs , Home prepared for child    Psychotropic Medications:         Pediatrician:       Pediatrician List:   [employer] [facility] [place]    [place]      Pediatrician Fax Number:    Risk Factors/Current Problems:  Substance Use    Cognitive State:  Alert , Able to Concentrate , Goal Oriented    Mood/Affect:  Calm , Bright    CSW Assessment: CSW spoke with patient this afternoon regarding her substance use during her pregnancy. Patient admits to using cocaine throughout her pregnancy but stopped about 35 years ago. She has three of her children in the home with her and all by the father of this new baby. The three children are 3, 35, and 75 years of age. She stated that her 35 year old lives next door and her 35 year old lives with her as well. Her other 3 children are living  with their father and she stated that she is not able to see them right now because their father knew she was using cocaine and wants to make sure she is clean before she sees them. Patient stated that she understands this. She stated that with her last baby, DSS CPS was involved because her newborn tested positive for cocaine at that time. She stated that they are not involved any longer and that they closed out her case in 30 days. She is aware that the newborn's meconium may return positive and is aware that if this is the case, that CSW will make a dSS CPS report at that time. She states she did not receive prenatal care because she did not want anyone to know that she was using cocaine. She states that she has some supplies but that the father of her newborn will be purchasing the rest of the supplies. She is  asking if there is a way she can receive assistance with a crib. CSW has informed nursing that if there are enough pack n plays in storage, that we can consider giving her one. CSW provided patient with outpatient rehab resources. SHe states she has stable housing and that she is hoping she will be able to stay clean. CSW advised her to call on her support system should she feel as though she needs to use again. Patient verbalized understanding.  CSW Plan/Description:  CSW Will Continue to Monitor Umbilical Cord Tissue Drug Screen Results and Make Report if [facility], LCSW 04/11/2018, 4:02 PM

## 2018-04-12 LAB — RHOGAM INJECTION: Unit division: 0

## 2018-04-12 MED ORDER — SERTRALINE HCL 50 MG PO TABS: 50.0000 mg | ORAL_TABLET | Freq: Every day | ORAL | 5 refills | Status: DC

## 2018-04-12 MED ORDER — IBUPROFEN 600 MG PO TABS: 600.0000 mg | ORAL_TABLET | Freq: Four times a day (QID) | ORAL | 0 refills | Status: DC | PRN

## 2018-04-12 MED ORDER — ACETAMINOPHEN 325 MG PO TABS: 650.0000 mg | ORAL_TABLET | ORAL | Status: DC | PRN

## 2018-04-12 NOTE — Discharge Instructions (Signed)

## 2018-04-12 NOTE — Progress Notes (Signed)
Rx given for home use,  Ibuprofen and Zoloft

## 2018-04-12 NOTE — Progress Notes (Signed)
Dc to home to car via auxillary 

## 2018-04-12 NOTE — Progress Notes (Signed)
Dc to home with NB.

## 2018-10-12 ENCOUNTER — Emergency Department: Payer: Self-pay

## 2018-10-12 ENCOUNTER — Emergency Department
Admission: EM | Admit: 2018-10-12 | Discharge: 2018-10-12 | Disposition: A | Discharge status: Home / Self Care | Payer: Self-pay | Attending: Emergency Medicine | Admitting: Emergency Medicine

## 2018-10-12 ENCOUNTER — Encounter: Payer: Self-pay | Admitting: *Deleted

## 2018-10-12 ENCOUNTER — Other Ambulatory Visit: Payer: Self-pay

## 2018-10-12 DIAGNOSIS — S67195A Crushing injury of left ring finger, initial encounter: Secondary | ICD-10-CM | POA: Insufficient documentation

## 2018-10-12 DIAGNOSIS — Y999 Unspecified external cause status: Secondary | ICD-10-CM | POA: Insufficient documentation

## 2018-10-12 DIAGNOSIS — W230XXA Caught, crushed, jammed, or pinched between moving objects, initial encounter: Secondary | ICD-10-CM | POA: Insufficient documentation

## 2018-10-12 DIAGNOSIS — Y929 Unspecified place or not applicable: Secondary | ICD-10-CM | POA: Insufficient documentation

## 2018-10-12 DIAGNOSIS — Y9389 Activity, other specified: Secondary | ICD-10-CM | POA: Insufficient documentation

## 2018-10-12 DIAGNOSIS — Z5321 Procedure and treatment not carried out due to patient leaving prior to being seen by health care provider: Secondary | ICD-10-CM | POA: Insufficient documentation

## 2018-10-12 NOTE — ED Notes (Signed)
Per Venida Jarvis, pt access, pt was seen walking out, prior pt was given a ASCOM and was overheard by Venida Jarvis to be angrily yelling at someone "about her kids", pt put down phone and walked out  [place], Writer notified, and verified by first RN, Lorriane Shire, pt was seen walking out   Room examined, no evidence of blood anywhere in room, no evidence of IV started, pt not actually seen by this RN
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
## 2018-10-12 NOTE — ED Notes (Signed)
Pt walking up hallway towards [place], yelling on phone. Pt slams phone on counter and sts, "Im not staying here. Ive got kids, I gotta go home. Stupid ass people I got to deal with all the time." Pt bleeding up hallway. Tech and RN ask to dress pt finger, pt complies. Pt unwilling to stay and be seen. Reports she needs to get a cab and go home. Pts finger dressed. Bleeding controled. Pt walked out to [place].

## 2018-10-14 ENCOUNTER — Encounter: Payer: Self-pay | Admitting: Emergency Medicine

## 2018-10-14 ENCOUNTER — Emergency Department
Admission: EM | Admit: 2018-10-14 | Discharge: 2018-10-14 | Disposition: A | Discharge status: Home / Self Care | Payer: Self-pay | Attending: Emergency Medicine | Admitting: Emergency Medicine

## 2018-10-14 ENCOUNTER — Other Ambulatory Visit: Payer: Self-pay

## 2018-10-14 DIAGNOSIS — S62639B Displaced fracture of distal phalanx of unspecified finger, initial encounter for open fracture: Secondary | ICD-10-CM | POA: Insufficient documentation

## 2018-10-14 DIAGNOSIS — Z79899 Other long term (current) drug therapy: Secondary | ICD-10-CM | POA: Insufficient documentation

## 2018-10-14 DIAGNOSIS — S61209A Unspecified open wound of unspecified finger without damage to nail, initial encounter: Secondary | ICD-10-CM | POA: Insufficient documentation

## 2018-10-14 DIAGNOSIS — Y929 Unspecified place or not applicable: Secondary | ICD-10-CM | POA: Insufficient documentation

## 2018-10-14 DIAGNOSIS — F17210 Nicotine dependence, cigarettes, uncomplicated: Secondary | ICD-10-CM | POA: Insufficient documentation

## 2018-10-14 DIAGNOSIS — J45909 Unspecified asthma, uncomplicated: Secondary | ICD-10-CM | POA: Insufficient documentation

## 2018-10-14 DIAGNOSIS — W230XXA Caught, crushed, jammed, or pinched between moving objects, initial encounter: Secondary | ICD-10-CM | POA: Insufficient documentation

## 2018-10-14 DIAGNOSIS — Y999 Unspecified external cause status: Secondary | ICD-10-CM | POA: Insufficient documentation

## 2018-10-14 DIAGNOSIS — Y939 Activity, unspecified: Secondary | ICD-10-CM | POA: Insufficient documentation

## 2018-10-14 MED ORDER — LIDOCAINE HCL (PF) 1 % IJ SOLN
5.0000 mL | Freq: Once | INTRAMUSCULAR | Status: AC
  Administered 2018-10-14: 5 mL via INTRADERMAL

## 2018-10-14 MED ORDER — LIDOCAINE HCL (PF) 1 % IJ SOLN
INTRAMUSCULAR | Status: AC
  Administered 2018-10-14: 16:00:00 5 mL via INTRADERMAL
  Filled 2018-10-14: qty 5

## 2018-10-14 MED ORDER — CEPHALEXIN 500 MG PO CAPS: 500.0000 mg | ORAL_CAPSULE | Freq: Three times a day (TID) | ORAL | 0 refills | Status: AC

## 2018-10-14 MED ORDER — SULFAMETHOXAZOLE-TRIMETHOPRIM 800-160 MG PO TABS: 1.0000 | ORAL_TABLET | Freq: Two times a day (BID) | ORAL | 0 refills | Status: DC

## 2018-10-14 NOTE — Discharge Instructions (Signed)
Call and schedule an appointment with orthopedics. You are at a high risk for infection. Return to the ER for concerns.
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
## 2018-10-14 NOTE — ED Provider Notes (Signed)
[facility] Emergency Department Provider Note  ____________________________________________  Time seen: Approximately 3:05 PM  I have reviewed the triage vital signs and the nursing notes.   HISTORY  Chief Complaint Finger Injury   HPI Tonya Nichols is a 33 y.o. female who presents to the emergency department for treatment and evaluation of injury to the left 4th finger. She states that it was shut in a door 2 days ago. She came here for treatment, but had to leave due to family issues. She states that she was extremely intoxicated and wasn't making good decisions. She changed the bandage yesterday, but didn't know she was supposed to put a non-stick dressing on before 2x2. The dressing is now stuck and she is unable to get it off. She tried soaking it in hot water without success.  Past Medical History:  Diagnosis Date  . Alcohol abuse   . Asthma    as child  . Chlamydia   . Chlamydia   . Cocaine use complicating pregnancy   . Gonorrhea   . 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 History of HPV infection   . Insufficient prenatal care   . Marijuana abuse   . UTI (lower urinary tract infection)   . Vaginal Pap smear with ASC-US     Patient Active Problem List   Diagnosis Date Noted  . Labor and delivery indication for care or intervention 04/10/2018  . No prenatal care in current pregnancy 03/19/2018  . Rh negative, antepartum 03/19/2018  . Abdominal pain affecting pregnancy 08/30/2014  . Alcohol abuse   . 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 Vaginal Pap smear with ASC-US   . Cocaine use complicating pregnancy   . Marijuana abuse   . 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 NO PAST SURGERIES      Prior to Admission medications   Medication Sig Start Date End Date Taking? Authorizing Provider  acetaminophen (TYLENOL) 325 MG tablet Take 2 tablets (650 mg total) by mouth every 4 (four) hours as needed for mild pain or moderate  pain. 04/12/18   Genia DelHaviland, Margaret, CNM  cephALEXin (KEFLEX) 500 MG capsule Take 1 capsule (500 mg total) by mouth 3 (three) times daily for 10 days. 10/14/18 10/24/18  Manreet Kiernan, Kasandra Knudsenari B, FNP  ibuprofen (ADVIL,MOTRIN) 600 MG tablet Take 1 tablet (600 mg total) by mouth every 6 (six) hours as needed for mild pain, moderate pain or cramping. 04/12/18   Genia DelHaviland, Margaret, CNM  sertraline (ZOLOFT) 50 MG tablet Take 1 tablet (50 mg total) by mouth daily. 04/12/18   Genia DelHaviland, Margaret, CNM  sulfamethoxazole-trimethoprim (BACTRIM DS) 800-160 MG tablet Take 1 tablet by mouth 2 (two) times daily. 10/14/18   Chinita Pesterriplett, Nyara Capell B, FNP    Allergies Patient has no known allergies.  Family History  Problem Relation Age of Onset  . Anesthesia problems Neg Hx   . Alcohol abuse Neg Hx     Social History Social History   Tobacco Use  . Smoking status: Current Every Day Smoker    Packs/day: 0.50    Years: 14.00    Pack years: 7.00  . Smokeless tobacco: Never Used  Substance Use Topics  . Alcohol use: Yes    Comment: drank a bottle of wine this am due to pain.  . Drug use: Yes    Types: Cocaine, Marijuana, Other-see comments    Comment: cocaine x1. Has only been 1 time in pregnancy  Review of Systems  Constitutional: negative for fever. Respiratory: Negative for cough or shortness of breath.  Musculoskeletal: Negative for myalgias Skin: Positive for gauze over left ring fingernail bed. Neurological: Negative for numbness or paresthesias. ____________________________________________   PHYSICAL EXAM:  VITAL SIGNS: ED Triage Vitals [10/14/18 1443]  Enc Vitals Group     BP (!) 111/93     Pulse Rate 85     Resp 14     Temp 98.1 F (36.7 C)     Temp Source Oral     SpO2 92 %     Weight 200 lb (90.7 kg)     Height 5\' 3"  (1.6 m)     Head Circumference      Peak Flow      Pain Score      Pain Loc      Pain Edu?      Excl. in [place]?      Constitutional: Well appearing. Eyes: Conjunctivae are  clear without discharge or drainage. Nose: No rhinorrhea noted. Mouth/Throat: Airway is patent.  Neck: No stridor. Unrestricted range of motion observed. Cardiovascular: Capillary refill is <3 seconds.  Respiratory: Respirations are even and unlabored.. Musculoskeletal: Bone exposure suspected from the injury to the left ring finger. Neurologic: Awake, alert, and oriented x 4.  Skin:  Skin and nail avulsion over the left ring finger.  ____________________________________________   LABS (all labs ordered are listed, but only abnormal results are displayed)  Labs Reviewed - No data to display ____________________________________________  EKG  Not indicated. ____________________________________________  RADIOLOGY  Not indicated. ____________________________________________   PROCEDURES  .Nerve Block  Date/Time: 10/14/2018 3:38 PM Performed by: Victorino Dike, FNP Authorized by: Victorino Dike, FNP   Consent:    Consent obtained:  Verbal   Consent given by:  Patient   Risks discussed:  Nerve damage, unsuccessful block and pain Indications:    Indications:  Pain relief Location:    Body area:  Upper extremity   Laterality:  Left Pre-procedure details:    Skin preparation:  Alcohol Skin anesthesia (see MAR for exact dosages):    Skin anesthesia method:  Topical application   Topical anesthesia: Gebauer's PainEase. Procedure details (see MAR for exact dosages):    Block needle gauge:  25 G   Anesthetic injected:  Lidocaine 1% w/o epi   Injection procedure:  Anatomic landmarks identified and incremental injection   Paresthesia:  None Post-procedure details:    Dressing:  Sterile dressing   Outcome:  Anesthesia achieved   Patient tolerance of procedure:  Tolerated well, no immediate complications   ____________________________________________   INITIAL IMPRESSION / ASSESSMENT AND PLAN / ED COURSE  Tonya Nichols is a 33 y.o. female presents to the  emergency department for removal of gauze from nailbed and wound of left ring finger. Digital block performed--see above. Finger was then soaked in hibiclens solution for several minutes and the gauze fell off. Exam of the finger is concerning for bone exposure. Patient adamantly refused to stay any longer to have x-ray or IV antibiotics and orthopedic consult. She states that she only came to have the gauze removed. I explained that she is at very high risk for infection which could lead to loss of finger or severe infection that can lead to death. She states that if I tell her who to follow up with, she will call tomorrow but will not stay here any longer except to have a new dressing. She also agreed to take the  antibiotics as prescribed. I advised her that she should return to the ER for any symptoms of concern if unable to see orthopedics.   Medications  lidocaine (PF) (XYLOCAINE) 1 % injection 5 mL (5 mLs Intradermal Given by Other 10/14/18 1537)     Pertinent labs & imaging results that were available during my care of the patient were reviewed by me and considered in my medical decision making (see chart for details).  ____________________________________________   FINAL CLINICAL IMPRESSION(S) / ED DIAGNOSES  Final diagnoses:  Open fracture of tuft of distal phalanx of finger  Avulsion of skin of finger, initial encounter    ED Discharge Orders         Ordered    cephALEXin (KEFLEX) 500 MG capsule  3 times daily     10/14/18 1559    sulfamethoxazole-trimethoprim (BACTRIM DS) 800-160 MG tablet  2 times daily     10/14/18 1559           Note:  This document was prepared using Dragon voice recognition software and may include unintentional dictation errors.   Chinita Pesterriplett, Kentrell Guettler B, FNP 10/14/18 1625    Phineas SemenGoodman, [place], MD 10/14/18 1721

## 2018-10-14 NOTE — ED Triage Notes (Signed)
Cant get the gauze off her left 4 th finger.

## 2019-03-07 NOTE — L&D Delivery Note (Signed)
Delivery Note At 6:41 PM a viable and healthy female was delivered via Vaginal, Spontaneous (Presentation:  LOA).  APGAR:8 9; weight  6#6oz.   Placenta status: Spontaneous, Intact.  Cord: 3 vessels with the following complications: None  Anesthesia: Epidural Episiotomy:  none Lacerations:  none Est. Blood Loss (mL):  200  Mom to postpartum.  Baby to Couplet care / Skin to Skin.  Her epidural stopped working midway through pushing and her iv infiltrated promptly after delivery, so she received 10u IM Pitocin after the third stage. Her bleeding was minimal, her fundus firm.  Christeen Douglas 11/11/2019, 7:01 PM

## 2019-08-12 ENCOUNTER — Ambulatory Visit (LOCAL_COMMUNITY_HEALTH_CENTER): Payer: Medicaid Other

## 2019-08-12 ENCOUNTER — Other Ambulatory Visit: Payer: Self-pay

## 2019-08-12 VITALS — BP 103/65 | Ht 63.0 in | Wt 205.5 lb

## 2019-08-12 DIAGNOSIS — Z3201 Encounter for pregnancy test, result positive: Secondary | ICD-10-CM

## 2019-08-12 MED ORDER — PRENATAL VITAMIN 27-0.8 MG PO TABS: 1.0000 | ORAL_TABLET | ORAL | 0 refills | Status: AC

## 2019-08-12 NOTE — Progress Notes (Signed)
Consult with Lyndel Safe, MD regarding abdominal pains x 5 days that start around 3:00-4:00pm in 15 minute intervals.  Patient reports the pain is not strong enough to call 911, but it hurts.  She has been lying down the rest of the afternoon and drinking water once the pain starts.  Her oldest daughter has been coming over to help out with the small children.  The patient is also concerned about her late entry to prenatal care and her history of RH negative and needing the Rhogam shot.  Patient reports having 2 home positive home UPT test 03/07/2019 but was very nervous about coming out for appointments due to the pandemic.  She had normal periods up until October 2020 and spotting in November and December 2020.  She reports being free of Marijuana and Cocaine for one month.  She desires a BTL after delivery. Per Dr. Alvester Morin: Patient should seek care at the ED if S/Sx worsen.  She should start prenatal care ASAP to initiate labs, Rhogam if needed, and BTL papers if desired for means of contraception after delivery.  If prenatal care started a [facility], a UDS should be obtained. Patient plans care at [facility] and was sent to pre-admit today.

## 2019-08-14 LAB — PREGNANCY, URINE: Preg Test, Ur: POSITIVE — AB

## 2019-08-20 ENCOUNTER — Other Ambulatory Visit: Payer: Self-pay

## 2019-08-20 ENCOUNTER — Ambulatory Visit: Payer: Medicaid Other | Admitting: Advanced Practice Midwife

## 2019-08-20 ENCOUNTER — Encounter: Payer: Self-pay | Admitting: Advanced Practice Midwife

## 2019-08-20 VITALS — BP 106/63 | HR 73 | Temp 97.4°F | Wt 206.4 lb

## 2019-08-20 DIAGNOSIS — O0933 Supervision of pregnancy with insufficient antenatal care, third trimester: Secondary | ICD-10-CM | POA: Diagnosis not present

## 2019-08-20 DIAGNOSIS — O0993 Supervision of high risk pregnancy, unspecified, third trimester: Secondary | ICD-10-CM | POA: Diagnosis not present

## 2019-08-20 NOTE — Progress Notes (Signed)
Record abstracted per [facility] records [name], [place]

## 2019-08-20 NOTE — Progress Notes (Signed)
Patient here for new OB at about 33 4/7. No previous PNC, no hospital visits, no U/S per patient. Patient is Rh negative. Patient needs obesity labs today, 1 hour glucose test and Tdap, also needs rhogham. Patient also states she has to leave about 2:30pm to take her daughter to job interview.Burt Knack, RN

## 2019-08-20 NOTE — Progress Notes (Addendum)
Patient states she has to leave before labs and before seeing provider. Patient's daughter needs to get to job interview, and patient wants to schedule the rest of her appointment for later this week. Patient scheduled for provider portion of new OB and labs, for 08/22/2019 at 10:40am. She is aware of all that needs to be done including 1 hour gtt, Tdap, Rhogham, and obesity labs. Patient states she will make sure to be here on Friday, states understanding that the appointment will be about 2 hours. Kick counts reviewed, cards given. S/S PTL reviewed, and handout given. New OB bag given and patient states she is taking PNV.Marland KitchenBurt Knack, RN

## 2019-08-29 ENCOUNTER — Telehealth: Payer: Self-pay

## 2019-08-29 NOTE — Telephone Encounter (Signed)
Attempted to call re: missed new ob  Visit for physical and labs.  Left appt line # with Mom [name], RN

## 2019-09-03 NOTE — Telephone Encounter (Signed)
Call to client to schedule appt for OB physical and labs. Left message to call on client's voicemail. Number to call provided. Jossie Ng, RN

## 2019-09-05 NOTE — Telephone Encounter (Signed)
Attempted phone call to patient to rescheduled missed appt for OB Physical and labs. No answer, LMTC. Tawny Hopping, RN

## 2019-09-09 NOTE — Telephone Encounter (Signed)
TC to patient to reschedule missed OB appointment. Patient needs 2nd half new OB appointment--PE, provider visit, labs. LM with number to call.Burt Knack, RN

## 2019-09-10 DIAGNOSIS — O09893 Supervision of other high risk pregnancies, third trimester: Secondary | ICD-10-CM | POA: Insufficient documentation

## 2019-09-10 DIAGNOSIS — Z91199 Patient's noncompliance with other medical treatment and regimen due to unspecified reason: Secondary | ICD-10-CM | POA: Insufficient documentation

## 2019-09-26 ENCOUNTER — Encounter: Payer: Self-pay | Admitting: Obstetrics and Gynecology

## 2019-09-26 ENCOUNTER — Observation Stay
Admission: EM | Admit: 2019-09-26 | Discharge: 2019-09-26 | Disposition: A | Discharge status: Home / Self Care | Payer: Medicaid Other | Attending: Obstetrics and Gynecology | Admitting: Obstetrics and Gynecology

## 2019-09-26 ENCOUNTER — Observation Stay: Payer: Medicaid Other

## 2019-09-26 ENCOUNTER — Other Ambulatory Visit: Payer: Self-pay

## 2019-09-26 DIAGNOSIS — F17200 Nicotine dependence, unspecified, uncomplicated: Secondary | ICD-10-CM | POA: Diagnosis not present

## 2019-09-26 DIAGNOSIS — O09893 Supervision of other high risk pregnancies, third trimester: Secondary | ICD-10-CM

## 2019-09-26 DIAGNOSIS — O09523 Supervision of elderly multigravida, third trimester: Secondary | ICD-10-CM | POA: Diagnosis not present

## 2019-09-26 DIAGNOSIS — O26893 Other specified pregnancy related conditions, third trimester: Secondary | ICD-10-CM | POA: Diagnosis not present

## 2019-09-26 DIAGNOSIS — O99333 Smoking (tobacco) complicating pregnancy, third trimester: Secondary | ICD-10-CM | POA: Insufficient documentation

## 2019-09-26 DIAGNOSIS — O99513 Diseases of the respiratory system complicating pregnancy, third trimester: Secondary | ICD-10-CM | POA: Diagnosis not present

## 2019-09-26 DIAGNOSIS — O99323 Drug use complicating pregnancy, third trimester: Secondary | ICD-10-CM | POA: Diagnosis not present

## 2019-09-26 DIAGNOSIS — Z825 Family history of asthma and other chronic lower respiratory diseases: Secondary | ICD-10-CM | POA: Diagnosis not present

## 2019-09-26 DIAGNOSIS — F329 Major depressive disorder, single episode, unspecified: Secondary | ICD-10-CM | POA: Insufficient documentation

## 2019-09-26 DIAGNOSIS — Z79899 Other long term (current) drug therapy: Secondary | ICD-10-CM | POA: Diagnosis not present

## 2019-09-26 DIAGNOSIS — Z3490 Encounter for supervision of normal pregnancy, unspecified, unspecified trimester: Secondary | ICD-10-CM | POA: Insufficient documentation

## 2019-09-26 DIAGNOSIS — O0930 Supervision of pregnancy with insufficient antenatal care, unspecified trimester: Secondary | ICD-10-CM

## 2019-09-26 DIAGNOSIS — O99343 Other mental disorders complicating pregnancy, third trimester: Secondary | ICD-10-CM | POA: Insufficient documentation

## 2019-09-26 DIAGNOSIS — O0933 Supervision of pregnancy with insufficient antenatal care, third trimester: Secondary | ICD-10-CM | POA: Diagnosis present

## 2019-09-26 DIAGNOSIS — Z91199 Patient's noncompliance with other medical treatment and regimen due to unspecified reason: Secondary | ICD-10-CM

## 2019-09-26 DIAGNOSIS — Z791 Long term (current) use of non-steroidal anti-inflammatories (NSAID): Secondary | ICD-10-CM | POA: Diagnosis not present

## 2019-09-26 DIAGNOSIS — O0943 Supervision of pregnancy with grand multiparity, third trimester: Secondary | ICD-10-CM | POA: Diagnosis not present

## 2019-09-26 DIAGNOSIS — Z8379 Family history of other diseases of the digestive system: Secondary | ICD-10-CM | POA: Diagnosis not present

## 2019-09-26 DIAGNOSIS — Z3A32 32 weeks gestation of pregnancy: Secondary | ICD-10-CM | POA: Insufficient documentation

## 2019-09-26 DIAGNOSIS — Z809 Family history of malignant neoplasm, unspecified: Secondary | ICD-10-CM | POA: Diagnosis not present

## 2019-09-26 LAB — RAPID HIV SCREEN (HIV 1/2 AB+AG)
HIV 1/2 Antibodies: NONREACTIVE
HIV-1 P24 Antigen - HIV24: NONREACTIVE

## 2019-09-26 LAB — CBC
HCT: 35.8 % — ABNORMAL LOW (ref 36.0–46.0)
Hemoglobin: 12 g/dL (ref 12.0–15.0)
MCH: 29.3 pg (ref 26.0–34.0)
MCHC: 33.5 g/dL (ref 30.0–36.0)
MCV: 87.3 fL (ref 80.0–100.0)
Platelets: 162 10*3/uL (ref 150–400)
RBC: 4.1 MIL/uL (ref 3.87–5.11)
RDW: 14.4 % (ref 11.5–15.5)
WBC: 10.2 10*3/uL (ref 4.0–10.5)
nRBC: 0 % (ref 0.0–0.2)

## 2019-09-26 LAB — URINE DRUG SCREEN, QUALITATIVE (ARMC ONLY)
Amphetamines, Ur Screen: NOT DETECTED
Barbiturates, Ur Screen: NOT DETECTED
Benzodiazepine, Ur Scrn: NOT DETECTED
Cannabinoid 50 Ng, Ur ~~LOC~~: NOT DETECTED
Cocaine Metabolite,Ur ~~LOC~~: NOT DETECTED
MDMA (Ecstasy)Ur Screen: NOT DETECTED
Methadone Scn, Ur: NOT DETECTED
Opiate, Ur Screen: NOT DETECTED
Phencyclidine (PCP) Ur S: NOT DETECTED
Tricyclic, Ur Screen: NOT DETECTED

## 2019-09-26 LAB — DIFFERENTIAL
Abs Immature Granulocytes: 0.2 10*3/uL — ABNORMAL HIGH (ref 0.00–0.07)
Basophils Absolute: 0.1 10*3/uL (ref 0.0–0.1)
Basophils Relative: 1 %
Eosinophils Absolute: 0.1 10*3/uL (ref 0.0–0.5)
Eosinophils Relative: 1 %
Immature Granulocytes: 2 %
Lymphocytes Relative: 16 %
Lymphs Abs: 1.6 10*3/uL (ref 0.7–4.0)
Monocytes Absolute: 0.7 10*3/uL (ref 0.1–1.0)
Monocytes Relative: 7 %
Neutro Abs: 7.6 10*3/uL (ref 1.7–7.7)
Neutrophils Relative %: 73 %

## 2019-09-26 LAB — CHLAMYDIA/NGC RT PCR (ARMC ONLY)
Chlamydia Tr: NOT DETECTED
N gonorrhoeae: NOT DETECTED

## 2019-09-26 LAB — HEPATITIS B SURFACE ANTIGEN: Hepatitis B Surface Ag: NONREACTIVE

## 2019-09-26 LAB — GROUP B STREP BY PCR: Group B strep by PCR: NEGATIVE

## 2019-09-26 LAB — FETAL SCREEN: Fetal Screen: NEGATIVE

## 2019-09-26 MED ORDER — CALCIUM CARBONATE ANTACID 500 MG PO CHEW: 2.0000 | CHEWABLE_TABLET | ORAL | Status: DC | PRN

## 2019-09-26 MED ORDER — ZOLPIDEM TARTRATE 5 MG PO TABS: 5.0000 mg | ORAL_TABLET | Freq: Every evening | ORAL | Status: DC | PRN

## 2019-09-26 MED ORDER — RHO D IMMUNE GLOBULIN 1500 UNIT/2ML IJ SOSY
300.0000 ug | PREFILLED_SYRINGE | Freq: Once | INTRAMUSCULAR | Status: AC
  Administered 2019-09-26: 300 ug via INTRAMUSCULAR
  Filled 2019-09-26: qty 2

## 2019-09-26 MED ORDER — PRENATAL MULTIVITAMIN CH: 1.0000 | ORAL_TABLET | Freq: Every day | ORAL | Status: DC

## 2019-09-26 MED ORDER — ACETAMINOPHEN 325 MG PO TABS: 650.0000 mg | ORAL_TABLET | ORAL | Status: DC | PRN

## 2019-09-26 MED ORDER — DOCUSATE SODIUM 100 MG PO CAPS: 100.0000 mg | ORAL_CAPSULE | Freq: Every day | ORAL | Status: DC

## 2019-09-26 NOTE — OB Triage Note (Signed)
Rhogam given per CNM verbal order. Vital signs stable and patient states no other concerns. RN reviewed discharge instructions with patient and answered all questions. RN reviewed follow up care with patient on September 30, 2019 at 1515. Patient verbalized understanding an ability to attend appointment.

## 2019-09-26 NOTE — Progress Notes (Signed)
Patient returned from ultrasound and monitors applied and assessing.

## 2019-09-26 NOTE — Discharge Summary (Signed)
Tonya Nichols is a 34 y.o. female. She is at [redacted]w[redacted]d gestation. Patient's last menstrual period was 12/28/2018 (within weeks). Estimated Date of Delivery: 11/18/19  Prenatal care site: no prenatal care this pregnancy  Current pregnancy complicated by:  1. Advanced maternal age, 34yo 2. Obesity, BMI 36 3. 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 Hx prior cocaine use (last a few mos ago per pt) and current MJ use.  5. Current smoker 6. Closely spaced pregnancies, last delivery 04/2018 7. RH Negative  Chief complaint: intermittent cramping, worse last night and improving now.   Location: lower abdomen Onset/timing: started last evening, became worse this AM early.  Duration: every 7-40min.  Quality: tight, crampy Severity: n/a Aggravating or alleviating conditions: none noted, denies recent IC.  Associated signs/symptoms: denies LOF, VB.  Context: no prenatal care due to transportation and childcare issues. Positive preg test in June 2021 at [facility], has not returned for care. Took home preg test around 03/07/19  S: Resting comfortably. no CTX, no VB.no LOF,  Active fetal movement. Denies: HA, visual changes, SOB, or RUQ/epigastric pain  Maternal Medical History:   Past Medical History:  Diagnosis Date  . Alcohol abuse    H/O cocaine abuse  . Anemia    First pregnancy  . Asthma    as child  . Chlamydia   . Chlamydia   . Cocaine use complicating pregnancy   . Depression    H/O post partum depression-2016-took Zoloft x 1/ 1/2 mth  . Gonorrhea   . 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 History of HPV infection   . History of UTI    2016  . Insufficient prenatal care   . Marijuana abuse   . UTI (lower urinary tract infection)   . Vaginal Pap smear with ASC-US     Past Surgical History:  Procedure Laterality Date  . denies surgical history    . NO PAST SURGERIES      No Known Allergies  Prior to Admission medications   Medication Sig Start Date End Date Taking? Authorizing Provider  Prenatal Vit-Fe  Fumarate-FA (MULTIVITAMIN-PRENATAL) 27-0.8 MG TABS tablet Take 1 tablet by mouth daily at 12 noon.   Yes [provider]  acetaminophen (TYLENOL) 325 MG tablet Take 2 tablets (650 mg total) by mouth every 4 (four) hours as needed for mild pain or moderate pain. Patient not taking: Reported on 08/12/2019 04/12/18   Genia Del, CNM  ibuprofen (ADVIL,MOTRIN) 600 MG tablet Take 1 tablet (600 mg total) by mouth every 6 (six) hours as needed for mild pain, moderate pain or cramping. Patient not taking: Reported on 08/12/2019 04/12/18   Genia Del, CNM  sertraline (ZOLOFT) 50 MG tablet Take 1 tablet (50 mg total) by mouth daily. Patient not taking: Reported on 08/12/2019 04/12/18   Genia Del, CNM  sulfamethoxazole-trimethoprim (BACTRIM DS) 800-160 MG tablet Take 1 tablet by mouth 2 (two) times daily. Patient not taking: Reported on 08/12/2019 10/14/18   Chinita Pester, FNP      Social History: She  reports that she has been smoking. She has a 1.40 pack-year smoking history. She has never used smokeless tobacco. She reports previous alcohol use. She reports previous drug use. Drugs: Marijuana, Other-see comments, and Cocaine.  Family History: family history includes Asthma in her daughter; Cancer in her father; Hepatitis C in her father and mother.   Review of Systems: A full review of systems was performed and negative except as noted in the HPI.     O:  BP Marland Kitchen)  112/60 (BP Location: Right Arm)   Pulse 97   Temp 98.1 F (36.7 C) (Oral)   Resp 14   Ht 5\' 3"  (1.6 m)   Wt (!) 93 kg   LMP 12/28/2018 (Within Weeks) Comment: Normal period  BMI 36.31 kg/m  Results for orders placed or performed during the hospital encounter of 09/26/19 (from the past 48 hour(s))  Chlamydia/NGC rt PCR ([facility] only)   Collection Time: 09/26/19 11:05 AM   Specimen: Vaginal/Rectal  Result Value Ref Range   Specimen source GC/Chlam ENDOCERVICAL    Chlamydia Tr NOT DETECTED NOT DETECTED   N  gonorrhoeae NOT DETECTED NOT DETECTED  Group B strep by PCR   Collection Time: 09/26/19 11:05 AM   Specimen: Vaginal/Rectal; Genital  Result Value Ref Range   Group B strep by PCR NEGATIVE NEGATIVE  Urine Drug Screen, Qualitative ([facility] only)   Collection Time: 09/26/19 11:05 AM  Result Value Ref Range   Tricyclic, Ur Screen NONE DETECTED NONE DETECTED   Amphetamines, Ur Screen NONE DETECTED NONE DETECTED   MDMA (Ecstasy)Ur Screen NONE DETECTED NONE DETECTED   Cocaine Metabolite,Ur [place] NONE DETECTED NONE DETECTED   Opiate, Ur Screen NONE DETECTED NONE DETECTED   Phencyclidine (PCP) Ur S NONE DETECTED NONE DETECTED   Cannabinoid 50 Ng, Ur  NONE DETECTED NONE DETECTED   Barbiturates, Ur Screen NONE DETECTED NONE DETECTED   Benzodiazepine, Ur Scrn NONE DETECTED NONE DETECTED   Methadone Scn, Ur NONE DETECTED NONE DETECTED  Hepatitis B surface antigen   Collection Time: 09/26/19 11:50 AM  Result Value Ref Range   Hepatitis B Surface Ag NON REACTIVE NON REACTIVE  CBC   Collection Time: 09/26/19 11:50 AM  Result Value Ref Range   WBC 10.2 4.0 - 10.5 K/uL   RBC 4.10 3.87 - 5.11 MIL/uL   Hemoglobin 12.0 12.0 - 15.0 g/dL   HCT 09/28/19 (L) 36 - 46 %   MCV 87.3 80.0 - 100.0 fL   MCH 29.3 26.0 - 34.0 pg   MCHC 33.5 30.0 - 36.0 g/dL   RDW 44.9 67.5 - 91.6 %   Platelets 162 150 - 400 K/uL   nRBC 0.0 0.0 - 0.2 %  Differential   Collection Time: 09/26/19 11:50 AM  Result Value Ref Range   Neutrophils Relative % 73 %   Neutro Abs 7.6 1.7 - 7.7 K/uL   Lymphocytes Relative 16 %   Lymphs Abs 1.6 0.7 - 4.0 K/uL   Monocytes Relative 7 %   Monocytes Absolute 0.7 0 - 1 K/uL   Eosinophils Relative 1 %   Eosinophils Absolute 0.1 0 - 0 K/uL   Basophils Relative 1 %   Basophils Absolute 0.1 0 - 0 K/uL   Immature Granulocytes 2 %   Abs Immature Granulocytes 0.20 (H) 0.00 - 0.07 K/uL  Rapid HIV screen (HIV 1/2 Ab+Ag)   Collection Time: 09/26/19 11:50 AM  Result Value Ref Range   HIV-1 P24  Antigen - HIV24 NON REACTIVE NON REACTIVE   HIV 1/2 Antibodies NON REACTIVE NON REACTIVE   Interpretation (HIV Ag Ab)      A non reactive test result means that HIV 1 or HIV 2 antibodies and HIV 1 p24 antigen were not detected in the specimen.  Fetal screen   Collection Time: 09/26/19 11:50 AM  Result Value Ref Range   Fetal Screen      NEG Performed at [facility], [address] [place], [place]   Type and  screen [facility]   Collection Time: 09/26/19 11:50 AM  Result Value Ref Range   ABO/RH(D) A NEG    Antibody Screen NEG    Sample Expiration      09/29/2019,2359 Performed at [facility], [address] [place], [place] [address]   Rhogam injection   Collection Time: 09/26/19  6:00 PM  Result Value Ref Range   Unit Number [id number]    Blood Component Type RHIG    Unit division 00    Status of Unit ISSUED    Transfusion Status      OK TO TRANSFUSE Performed at [facility], [address] [place], [place] [address]      Constitutional: NAD, AAOx3  HE/ENT: extraocular movements grossly intact, moist mucous membranes CV: RRR PULM: nl respiratory effort, CTABL     Abd: gravid, non-tender, non-distended, soft  Ext: Non-tender, Nonedematous   Psych: mood appropriate, speech normal Pelvic: Cervix 1cm/long/OOP, firm and posterior.   Fetal  monitoring: Cat I Appropriate for GA Baseline: 150bpm Variability: moderate Accelerations: present x >2 Decelerations: single variable decel noted around 11am, no further decels.  Toco: occasional UI noted, no UCs palpated.     A/P: 34 y.o. [redacted]w[redacted]d here for antenatal surveillance for preterm contractions and no prenatal care.   Principle Diagnosis: No prenatal care in pregnancy   [place] completed, SIUP c/w gestational age [redacted]w[redacted]d, establishes EDD of 11/18/19, no concerns on anatomy US completed today  Initial prenatal labs obtained today, RH negative and Rhogam  given.   Preterm labor: not present.   Fetal Wellbeing: Reassuring Cat 1 tracing with Reactive NST   D/c home stable, precautions reviewed, follow-up as scheduled. New OB appt made for 09/30/19 at [facility]    Randa Ngo, CNM  09/26/2019  4:37 PM

## 2019-09-26 NOTE — OB Triage Note (Signed)
Patient [id number] with unknown due date/gestational age because of no prenatal care presents to Labor and Delivery with complaints of contractions every 7 minutes apart last night/this morning. She states that around 0700 they were really uncomfortable and she states that since she has gotten here they have spaced apart. She denies leaking of fluid, vaginal discharge or bleeding and reports + FM. Patient states she has had no prenatal care due to transportation issues. She states [facility] and has received no care since then. Patient reports regular marijuana use and reports last use of cocaine 1-2 months ago. Monitors applied and assessing. Vital signs stable.

## 2019-09-26 NOTE — Progress Notes (Signed)
Patient to ultrasound and transported via wheelchair.

## 2019-09-27 LAB — RHOGAM INJECTION: Unit division: 0

## 2019-09-27 LAB — TYPE AND SCREEN
ABO/RH(D): A NEG
Antibody Screen: NEGATIVE

## 2019-09-27 LAB — RUBELLA SCREEN: Rubella: 1.34 index (ref >0.99)

## 2019-09-27 LAB — HEMOGLOBIN A1C
Hgb A1c MFr Bld: 5.1 % (ref 4.8–5.6)
Mean Plasma Glucose: 100 mg/dL

## 2019-09-27 LAB — VARICELLA ZOSTER ANTIBODY, IGG: Varicella IgG: 1055 index (ref >165)

## 2019-09-27 LAB — RPR: RPR Ser Ql: NONREACTIVE

## 2019-10-24 IMAGING — US US OB LIMITED
1 series · 14 of 24 positions shown · non-contrast
Comparison: none

CLINICAL DATA: Right-sided pain.  No prenatal care.

EXAM:
LIMITED OBSTETRIC ULTRASOUND

[Series 1: us ob limited · 14 of 24 slices shown]
[im 1/24]
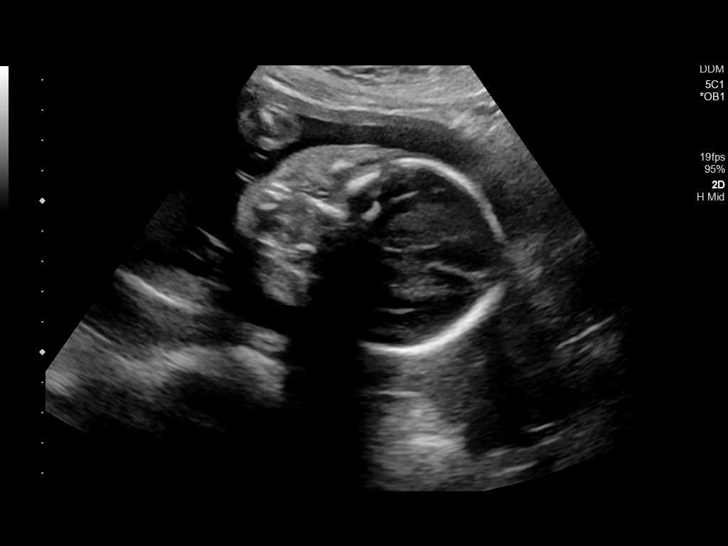
[im 3/24]
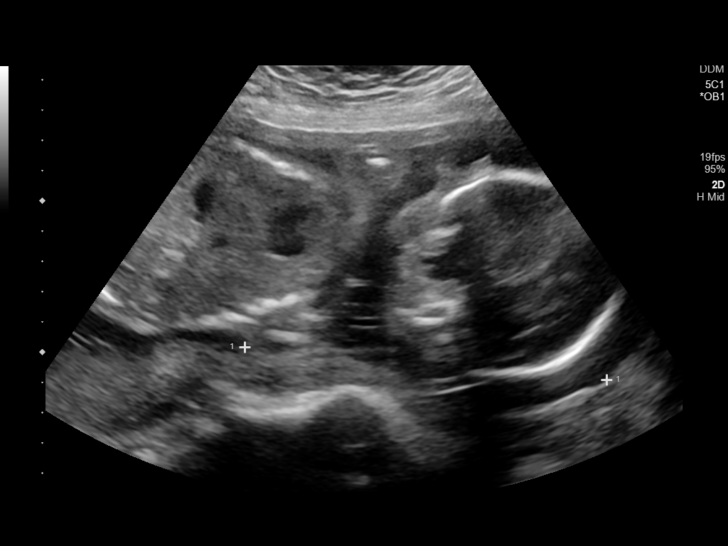
[im 5/24]
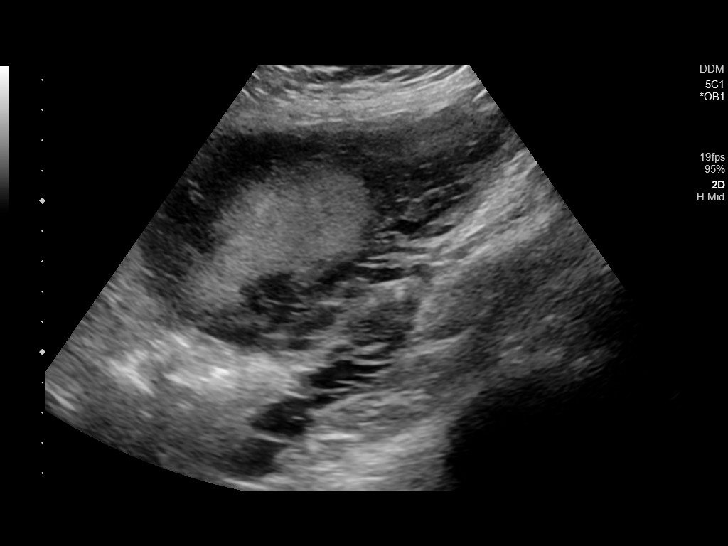
[im 7/24]
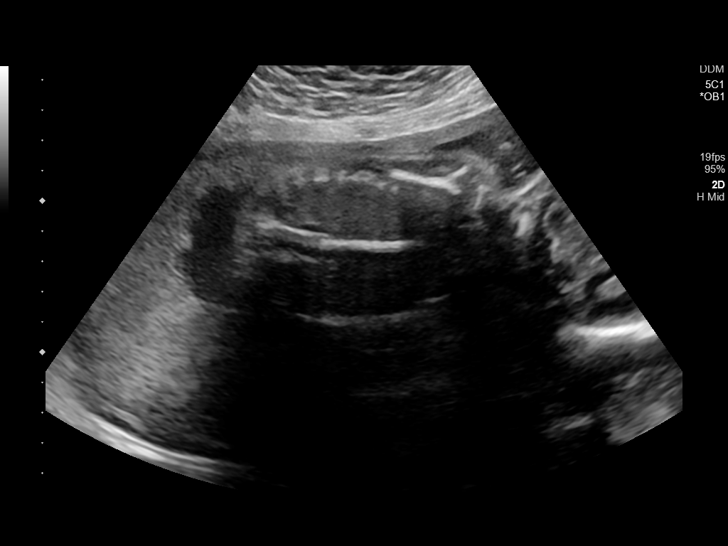
[im 8/24]
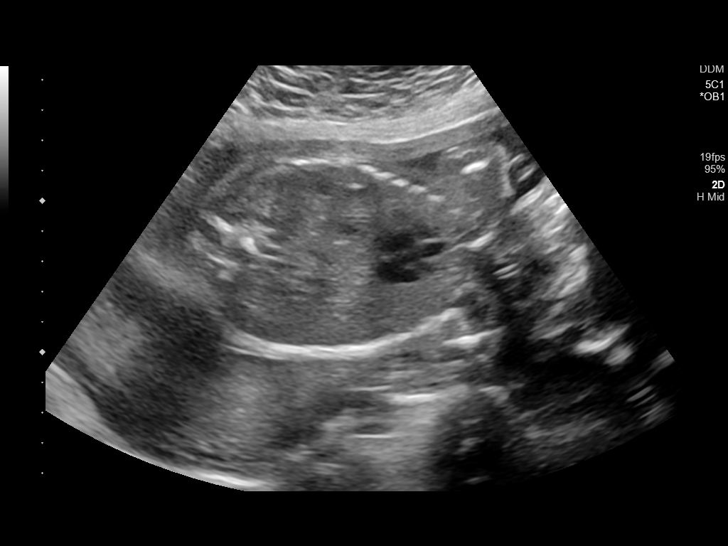
[im 10/24]
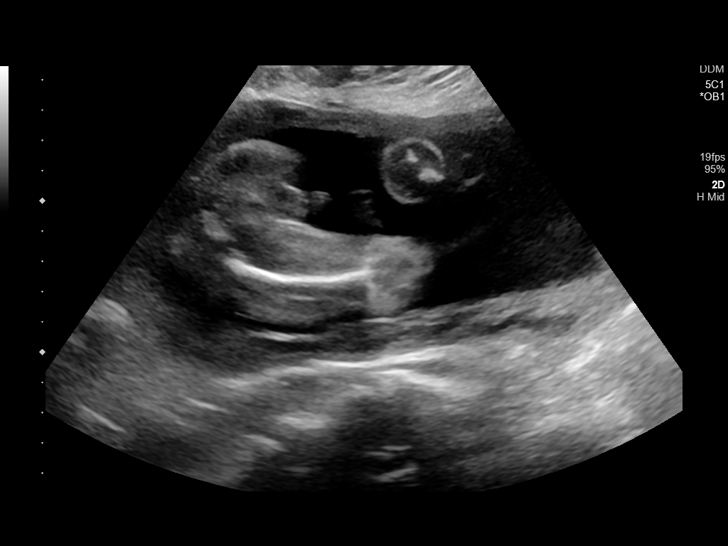
[im 12/24]
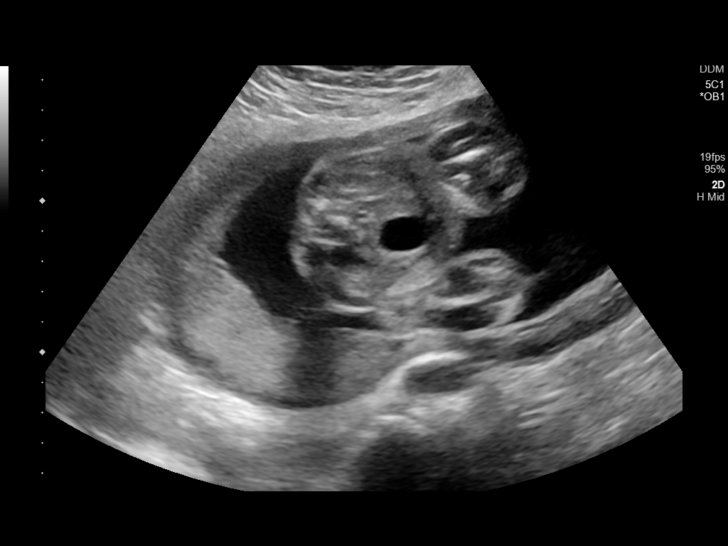
[im 13/24]
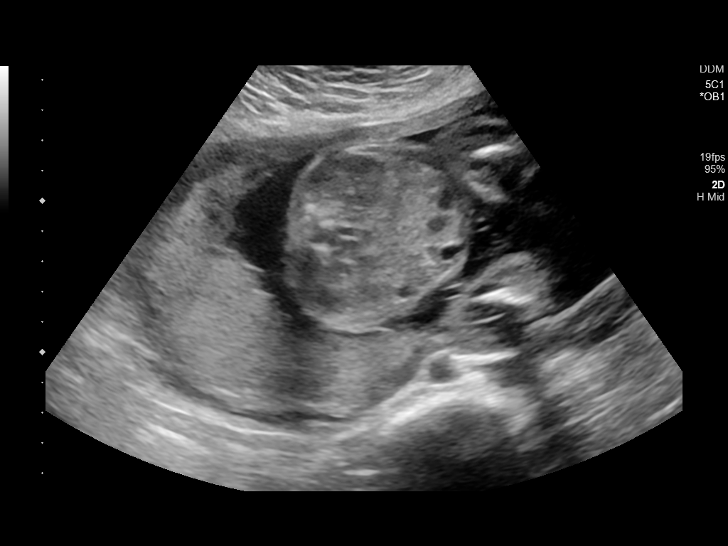
[im 15/24]
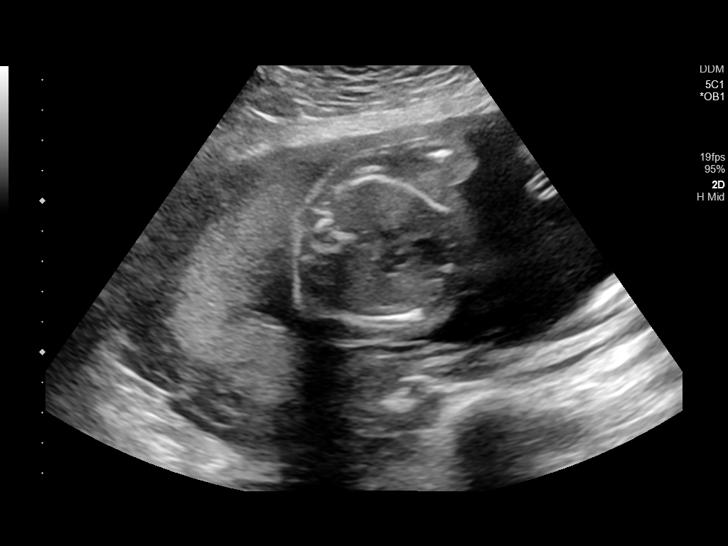
[im 17/24]
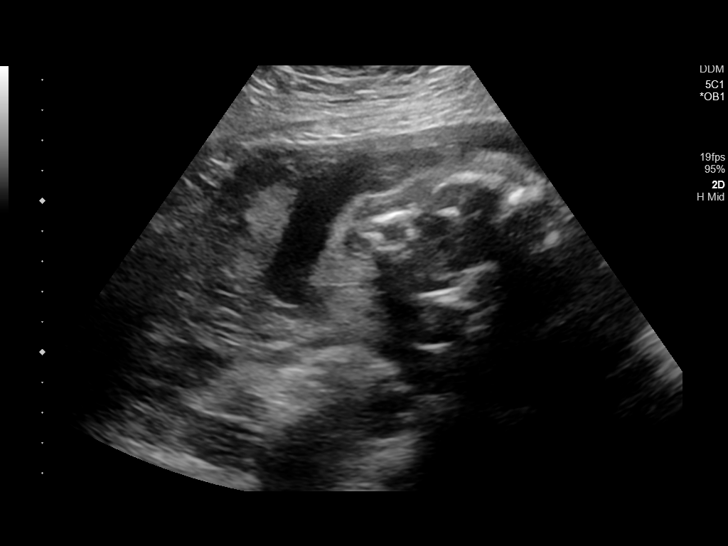
[im 19/24]
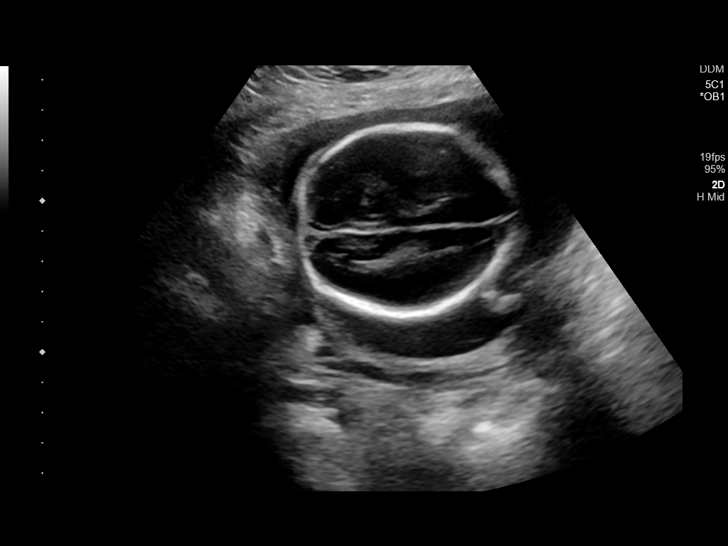
[im 20/24]
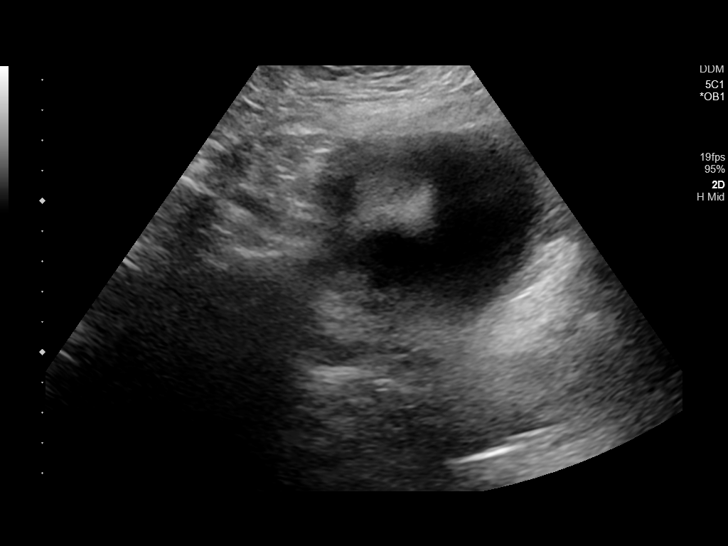
[im 22/24]
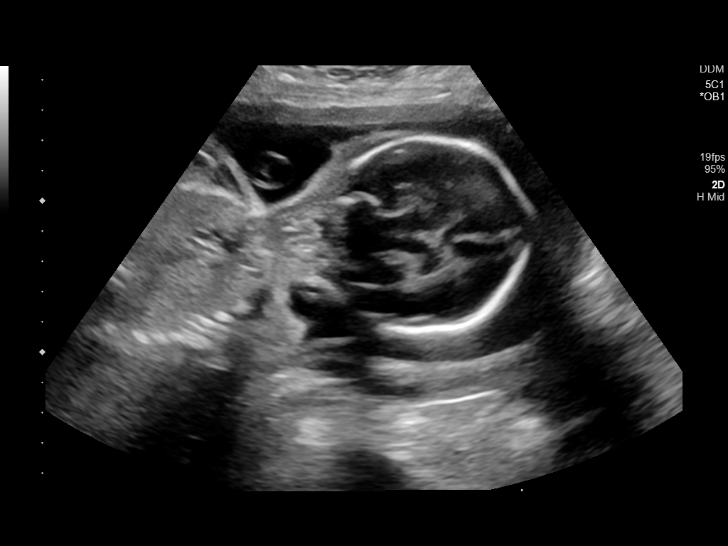
[im 24/24]
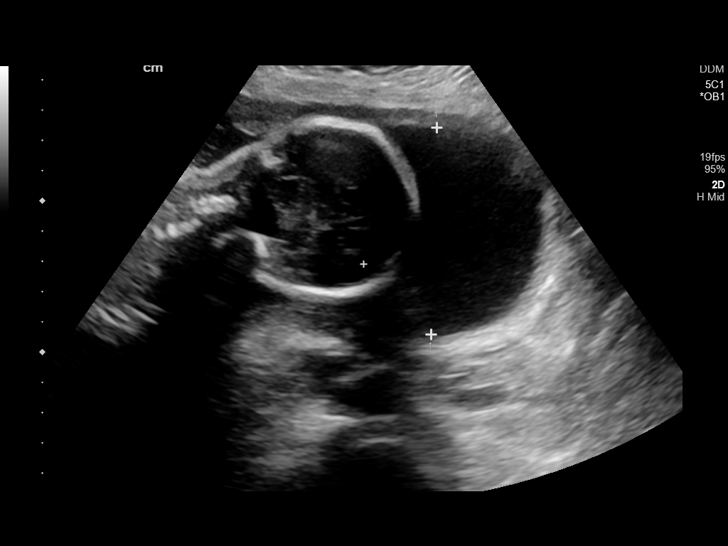

[14 of 24 positions shown; findings below may reference images not displayed]

FINDINGS: Number of Fetuses: 1

Heart Rate:  153 bpm

Movement: Present

Presentation: Cephalic

Placental Location: Posterior fundal

Previa: No

Amniotic Fluid (Subjective):  Normal

AFI: 6.8 cm

BPD: 6.4 cm 25 w  6 d

MATERNAL FINDINGS:

Cervix:  Appears closed.

Uterus/Adnexae: No abnormality visualized.
IMPRESSION: Single viable intrauterine pregnancy at 25 weeks 6 days. Cephalic
presentation.

This exam is performed on an emergent basis and does not
comprehensively evaluate fetal size, dating, or anatomy; follow-up
complete OB US should be considered if further fetal assessment is
warranted.

## 2019-11-11 ENCOUNTER — Other Ambulatory Visit: Payer: Self-pay

## 2019-11-11 ENCOUNTER — Inpatient Hospital Stay
Admission: EM | Admit: 2019-11-11 | Discharge: 2019-11-12 | DRG: 805 | Disposition: A | Discharge status: Home / Self Care | Payer: Medicaid Other

## 2019-11-11 ENCOUNTER — Inpatient Hospital Stay
Admission: AD | Admit: 2019-11-11 | Payer: Medicaid Other | Source: Ambulatory Visit | Admitting: Obstetrics and Gynecology

## 2019-11-11 ENCOUNTER — Inpatient Hospital Stay: Payer: Medicaid Other | Admitting: Anesthesiology

## 2019-11-11 ENCOUNTER — Encounter: Payer: Self-pay | Admitting: Obstetrics and Gynecology

## 2019-11-11 DIAGNOSIS — O26893 Other specified pregnancy related conditions, third trimester: Principal | ICD-10-CM | POA: Diagnosis present

## 2019-11-11 DIAGNOSIS — U071 COVID-19: Secondary | ICD-10-CM | POA: Diagnosis present

## 2019-11-11 DIAGNOSIS — O9852 Other viral diseases complicating childbirth: Secondary | ICD-10-CM | POA: Diagnosis present

## 2019-11-11 DIAGNOSIS — Z3A39 39 weeks gestation of pregnancy: Secondary | ICD-10-CM

## 2019-11-11 DIAGNOSIS — Z6791 Unspecified blood type, Rh negative: Secondary | ICD-10-CM

## 2019-11-11 DIAGNOSIS — F17210 Nicotine dependence, cigarettes, uncomplicated: Secondary | ICD-10-CM | POA: Diagnosis present

## 2019-11-11 DIAGNOSIS — O99334 Smoking (tobacco) complicating childbirth: Secondary | ICD-10-CM | POA: Diagnosis present

## 2019-11-11 DIAGNOSIS — O99214 Obesity complicating childbirth: Secondary | ICD-10-CM | POA: Diagnosis present

## 2019-11-11 DIAGNOSIS — E669 Obesity, unspecified: Secondary | ICD-10-CM | POA: Diagnosis present

## 2019-11-11 DIAGNOSIS — Z91199 Patient's noncompliance with other medical treatment and regimen due to unspecified reason: Secondary | ICD-10-CM

## 2019-11-11 DIAGNOSIS — O479 False labor, unspecified: Secondary | ICD-10-CM | POA: Diagnosis present

## 2019-11-11 LAB — CBC
HCT: 37 % (ref 36.0–46.0)
Hemoglobin: 13 g/dL (ref 12.0–15.0)
MCH: 29.2 pg (ref 26.0–34.0)
MCHC: 35.1 g/dL (ref 30.0–36.0)
MCV: 83.1 fL (ref 80.0–100.0)
Platelets: 167 10*3/uL (ref 150–400)
RBC: 4.45 MIL/uL (ref 3.87–5.11)
RDW: 14.6 % (ref 11.5–15.5)
WBC: 12.1 10*3/uL — ABNORMAL HIGH (ref 4.0–10.5)
nRBC: 0 % (ref 0.0–0.2)

## 2019-11-11 LAB — URINE DRUG SCREEN, QUALITATIVE (ARMC ONLY)
Amphetamines, Ur Screen: NOT DETECTED
Barbiturates, Ur Screen: NOT DETECTED
Benzodiazepine, Ur Scrn: NOT DETECTED
Cannabinoid 50 Ng, Ur ~~LOC~~: NOT DETECTED
Cocaine Metabolite,Ur ~~LOC~~: NOT DETECTED
MDMA (Ecstasy)Ur Screen: NOT DETECTED
Methadone Scn, Ur: NOT DETECTED
Opiate, Ur Screen: NOT DETECTED
Phencyclidine (PCP) Ur S: NOT DETECTED
Tricyclic, Ur Screen: NOT DETECTED

## 2019-11-11 LAB — COMPREHENSIVE METABOLIC PANEL
ALT: 17 U/L (ref 0–44)
AST: 19 U/L (ref 15–41)
Albumin: 3 g/dL — ABNORMAL LOW (ref 3.5–5.0)
Alkaline Phosphatase: 88 U/L (ref 38–126)
Anion gap: 10 (ref 5–15)
BUN: 9 mg/dL (ref 6–20)
CO2: 21 mmol/L — ABNORMAL LOW (ref 22–32)
Calcium: 8.9 mg/dL (ref 8.9–10.3)
Chloride: 106 mmol/L (ref 98–111)
Creatinine, Ser: 0.53 mg/dL (ref 0.44–1.00)
GFR calc Af Amer: >60 mL/min (ref >60)
GFR calc non Af Amer: >60 mL/min (ref >60)
Glucose, Bld: 112 mg/dL — ABNORMAL HIGH (ref 70–99)
Potassium: 3.9 mmol/L (ref 3.5–5.1)
Sodium: 137 mmol/L (ref 135–145)
Total Bilirubin: 0.6 mg/dL (ref 0.3–1.2)
Total Protein: 6.9 g/dL (ref 6.5–8.1)

## 2019-11-11 LAB — SARS CORONAVIRUS 2 BY RT PCR (HOSPITAL ORDER, PERFORMED IN ~~LOC~~ HOSPITAL LAB): SARS Coronavirus 2: POSITIVE — AB

## 2019-11-11 LAB — GROUP B STREP BY PCR: Group B strep by PCR: NEGATIVE

## 2019-11-11 MED ORDER — LACTATED RINGERS IV SOLN: INTRAVENOUS | Status: DC

## 2019-11-11 MED ORDER — IBUPROFEN 600 MG PO TABS
600.0000 mg | ORAL_TABLET | Freq: Four times a day (QID) | ORAL | Status: DC
  Administered 2019-11-12: 600 mg via ORAL
  Filled 2019-11-11: qty 1

## 2019-11-11 MED ORDER — LIDOCAINE HCL (PF) 1 % IJ SOLN
INTRAMUSCULAR | Status: DC | PRN
  Administered 2019-11-11: 3 mL

## 2019-11-11 MED ORDER — OXYTOCIN-SODIUM CHLORIDE 30-0.9 UT/500ML-% IV SOLN
2.5000 [IU]/h | INTRAVENOUS | Status: DC
  Filled 2019-11-11: qty 1000

## 2019-11-11 MED ORDER — LIDOCAINE HCL (PF) 1 % IJ SOLN: 30.0000 mL | INTRAMUSCULAR | Status: DC | PRN

## 2019-11-11 MED ORDER — OXYCODONE HCL 5 MG PO TABS
5.0000 mg | ORAL_TABLET | ORAL | Status: DC | PRN
  Administered 2019-11-11 – 2019-11-12 (×6): 5 mg via ORAL
  Filled 2019-11-11 (×6): qty 1

## 2019-11-11 MED ORDER — SENNOSIDES-DOCUSATE SODIUM 8.6-50 MG PO TABS
2.0000 | ORAL_TABLET | ORAL | Status: DC
  Administered 2019-11-12: 2 via ORAL
  Filled 2019-11-11: qty 2

## 2019-11-11 MED ORDER — SIMETHICONE 80 MG PO CHEW: 80.0000 mg | CHEWABLE_TABLET | ORAL | Status: DC | PRN

## 2019-11-11 MED ORDER — PRENATAL MULTIVITAMIN CH
1.0000 | ORAL_TABLET | Freq: Every day | ORAL | Status: DC
  Administered 2019-11-12: 1 via ORAL
  Filled 2019-11-11: qty 1

## 2019-11-11 MED ORDER — PHENYLEPHRINE 40 MCG/ML (10ML) SYRINGE FOR IV PUSH (FOR BLOOD PRESSURE SUPPORT)
80.0000 ug | PREFILLED_SYRINGE | INTRAVENOUS | Status: DC | PRN
  Filled 2019-11-11: qty 10

## 2019-11-11 MED ORDER — SODIUM CHLORIDE 0.9 % IV SOLN: 250.0000 mL | INTRAVENOUS | Status: DC | PRN

## 2019-11-11 MED ORDER — CALCIUM CARBONATE ANTACID 500 MG PO CHEW: 2.0000 | CHEWABLE_TABLET | ORAL | Status: DC | PRN

## 2019-11-11 MED ORDER — ONDANSETRON HCL 4 MG PO TABS
4.0000 mg | ORAL_TABLET | ORAL | Status: DC | PRN
  Filled 2019-11-11: qty 1

## 2019-11-11 MED ORDER — LIDOCAINE-EPINEPHRINE (PF) 1.5 %-1:200000 IJ SOLN
INTRAMUSCULAR | Status: DC | PRN
  Administered 2019-11-11: 3 mL via PERINEURAL

## 2019-11-11 MED ORDER — SOD CITRATE-CITRIC ACID 500-334 MG/5ML PO SOLN: 30.0000 mL | ORAL | Status: DC | PRN

## 2019-11-11 MED ORDER — ACETAMINOPHEN 325 MG PO TABS
650.0000 mg | ORAL_TABLET | ORAL | Status: DC | PRN
  Administered 2019-11-12 (×4): 650 mg via ORAL
  Filled 2019-11-11 (×4): qty 2

## 2019-11-11 MED ORDER — FENTANYL 2.5 MCG/ML W/ROPIVACAINE 0.15% IN NS 100 ML EPIDURAL (ARMC)
EPIDURAL | Status: AC
  Filled 2019-11-11: qty 100

## 2019-11-11 MED ORDER — BENZOCAINE-MENTHOL 20-0.5 % EX AERO: 1.0000 "application " | INHALATION_SPRAY | CUTANEOUS | Status: DC | PRN

## 2019-11-11 MED ORDER — SODIUM CHLORIDE 0.9% FLUSH: 3.0000 mL | INTRAVENOUS | Status: DC | PRN

## 2019-11-11 MED ORDER — ACETAMINOPHEN 500 MG PO TABS
1000.0000 mg | ORAL_TABLET | Freq: Four times a day (QID) | ORAL | Status: DC | PRN
  Administered 2019-11-11 – 2019-11-12 (×2): 1000 mg via ORAL
  Filled 2019-11-11 (×2): qty 2

## 2019-11-11 MED ORDER — ONDANSETRON HCL 4 MG/2ML IJ SOLN: 4.0000 mg | INTRAMUSCULAR | Status: DC | PRN

## 2019-11-11 MED ORDER — LACTATED RINGERS IV SOLN
500.0000 mL | Freq: Once | INTRAVENOUS | Status: AC
  Administered 2019-11-11: 500 mL via INTRAVENOUS

## 2019-11-11 MED ORDER — FENTANYL 2.5 MCG/ML W/ROPIVACAINE 0.15% IN NS 100 ML EPIDURAL (ARMC)
12.0000 mL/h | EPIDURAL | Status: DC
  Administered 2019-11-11: 12 mL/h via EPIDURAL

## 2019-11-11 MED ORDER — DIPHENHYDRAMINE HCL 25 MG PO CAPS
25.0000 mg | ORAL_CAPSULE | Freq: Four times a day (QID) | ORAL | Status: DC | PRN
  Administered 2019-11-11: 25 mg via ORAL
  Filled 2019-11-11: qty 1

## 2019-11-11 MED ORDER — WITCH HAZEL-GLYCERIN EX PADS: 1.0000 "application " | MEDICATED_PAD | CUTANEOUS | Status: DC | PRN

## 2019-11-11 MED ORDER — EPHEDRINE 5 MG/ML INJ
10.0000 mg | INTRAVENOUS | Status: DC | PRN
  Filled 2019-11-11: qty 2

## 2019-11-11 MED ORDER — OXYTOCIN BOLUS FROM INFUSION
333.0000 mL | Freq: Once | INTRAVENOUS | Status: AC
  Administered 2019-11-11: 333 mL via INTRAVENOUS

## 2019-11-11 MED ORDER — BUPIVACAINE HCL (PF) 0.25 % IJ SOLN
INTRAMUSCULAR | Status: DC | PRN
  Administered 2019-11-11: 4 mL via EPIDURAL
  Administered 2019-11-11: 2 mL via EPIDURAL

## 2019-11-11 MED ORDER — OXYTOCIN 10 UNIT/ML IJ SOLN
INTRAMUSCULAR | Status: AC
  Administered 2019-11-11: 10 [IU]
  Filled 2019-11-11: qty 1

## 2019-11-11 MED ORDER — MEASLES, MUMPS & RUBELLA VAC IJ SOLR
0.5000 mL | Freq: Once | INTRAMUSCULAR | Status: DC
  Filled 2019-11-11: qty 0.5

## 2019-11-11 MED ORDER — DIBUCAINE (PERIANAL) 1 % EX OINT: 1.0000 "application " | TOPICAL_OINTMENT | CUTANEOUS | Status: DC | PRN

## 2019-11-11 MED ORDER — OXYTOCIN-SODIUM CHLORIDE 30-0.9 UT/500ML-% IV SOLN: 1.0000 m[IU]/min | INTRAVENOUS | Status: DC

## 2019-11-11 MED ORDER — COCONUT OIL OIL: 1.0000 "application " | TOPICAL_OIL | Status: DC | PRN

## 2019-11-11 MED ORDER — SODIUM CHLORIDE 0.9% FLUSH: 3.0000 mL | Freq: Two times a day (BID) | INTRAVENOUS | Status: DC

## 2019-11-11 MED ORDER — LACTATED RINGERS IV SOLN: 500.0000 mL | INTRAVENOUS | Status: DC | PRN

## 2019-11-11 MED ORDER — DIPHENHYDRAMINE HCL 50 MG/ML IJ SOLN: 12.5000 mg | INTRAMUSCULAR | Status: DC | PRN

## 2019-11-11 MED ORDER — BISACODYL 10 MG RE SUPP
10.0000 mg | Freq: Every day | RECTAL | Status: DC | PRN
  Filled 2019-11-11: qty 1

## 2019-11-11 MED ORDER — TETANUS-DIPHTH-ACELL PERTUSSIS 5-2.5-18.5 LF-MCG/0.5 IM SUSP
0.5000 mL | Freq: Once | INTRAMUSCULAR | Status: DC
  Filled 2019-11-11: qty 0.5

## 2019-11-11 MED ORDER — FLEET ENEMA 7-19 GM/118ML RE ENEM: 1.0000 | ENEMA | Freq: Every day | RECTAL | Status: DC | PRN

## 2019-11-11 MED ORDER — ONDANSETRON HCL 4 MG/2ML IJ SOLN: 4.0000 mg | Freq: Four times a day (QID) | INTRAMUSCULAR | Status: DC | PRN

## 2019-11-11 MED ORDER — ZOLPIDEM TARTRATE 5 MG PO TABS: 5.0000 mg | ORAL_TABLET | Freq: Every evening | ORAL | Status: DC | PRN

## 2019-11-11 MED ORDER — FENTANYL CITRATE (PF) 100 MCG/2ML IJ SOLN
50.0000 ug | INTRAMUSCULAR | Status: DC | PRN
  Administered 2019-11-11: 100 ug via INTRAVENOUS
  Filled 2019-11-11: qty 2

## 2019-11-11 NOTE — H&P (Signed)
OB History & Physical   History of Present Illness:  Chief Complaint:   HPI:  Tonya Nichols is a 34 y.o. [id number] female at [redacted]w[redacted]d dated by [place] in third trimester.  She presents to L&D for early labor  Reports active fetal movement  Contractions: every 7 to 10 minutes LOF/SROM: denies  Vaginal bleeding: denies   Pregnancy Issues: 1. Inadequate prenatal care - prenatal visit x 1 at 34 weeks  2. AMA - 34 years old at time of delivery  3. Obesity - BMI 36 4. 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 Polysubstance use in pregnancy - Cocaine, THC, and ETOH 6. Tobacco use in pregnancy  7. Closely spaced pregnancy - last delivery 04/2018 8. Rh negative - Rhogam given 09/26/2019  Patient Active Problem List   Diagnosis Date Noted  . Encounter for supervision of normal pregnancy 09/26/2019  . Pregnancy complicated by noncompliance in third trimester, antepartum 09/10/2019  . Labor and delivery indication for care or intervention 04/10/2018  . No prenatal care in current pregnancy 03/19/2018  . Rh negative, antepartum 03/19/2018  . Alcohol abuse   . 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 Cocaine use complicating pregnancy   . Marijuana abuse      Maternal Medical History:   Past Medical History:  Diagnosis Date  . Alcohol abuse    H/O cocaine abuse  . Anemia    First pregnancy  . Asthma    as child  . Chlamydia   . Chlamydia   . Cocaine use complicating pregnancy   . Depression    H/O post partum depression-2016-took Zoloft x 1/ 1/2 mth  . Gonorrhea   . 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 History of HPV infection   . History of UTI    2016  . Insufficient prenatal care   . Marijuana abuse   . Post partum depression 07/20/2011  . UTI (lower urinary tract infection)   . Vaginal Pap smear with ASC-US   . Vaginal Pap smear with ASC-US     Past Surgical History:  Procedure Laterality Date  . denies surgical history    . NO PAST SURGERIES      No Known Allergies  Prior to Admission medications   Medication Sig Start  Date End Date Taking? Authorizing Provider  diphenhydrAMINE (BENADRYL) 25 MG tablet Take 25 mg by mouth every 6 (six) hours as needed.   Yes [provider]  Prenatal Vit-Fe Fumarate-FA (MULTIVITAMIN-PRENATAL) 27-0.8 MG TABS tablet Take 1 tablet by mouth daily at 12 noon.   Yes [provider]  acetaminophen (TYLENOL) 325 MG tablet Take 2 tablets (650 mg total) by mouth every 4 (four) hours as needed for mild pain or moderate pain. Patient not taking: Reported on 08/12/2019 04/12/18   Genia Del, CNM  ibuprofen (ADVIL,MOTRIN) 600 MG tablet Take 1 tablet (600 mg total) by mouth every 6 (six) hours as needed for mild pain, moderate pain or cramping. Patient not taking: Reported on 08/12/2019 04/12/18   Genia Del, CNM  sertraline (ZOLOFT) 50 MG tablet Take 1 tablet (50 mg total) by mouth daily. Patient not taking: Reported on 08/12/2019 04/12/18   Genia Del, CNM  sulfamethoxazole-trimethoprim (BACTRIM DS) 800-160 MG tablet Take 1 tablet by mouth 2 (two) times daily. Patient not taking: Reported on 08/12/2019 10/14/18   Chinita Pester, FNP     Prenatal care site:  [facility]  Social History: She  reports that she has been smoking. She has a 1.40 pack-year smoking history. She  has never used smokeless tobacco. She reports current alcohol use. She reports previous drug use. Drugs: Marijuana, Other-see comments, and Cocaine.  Family History: family history includes Asthma in her daughter; Cancer in her father; Hepatitis C in her father and mother.   Review of Systems: A full review of systems was performed and negative except as noted in the HPI.     Physical Exam:  Vital Signs: LMP 12/28/2018 (Within Weeks) Comment: Normal period Physical Exam  General: no acute distress.  HEENT: normocephalic, atraumatic Heart: regular rate & rhythm.  No murmurs/rubs/gallops Lungs: clear to auscultation bilaterally, normal respiratory effort Abdomen: soft, gravid,  non-tender;  EFW: 7lbs  Pelvic:   External: Normal external female genitalia  Cervix: 3/70/-1 by RN    Extremities: non-tender, symmetric, no edema bilaterally.  DTRs: 2+/2+  Neurologic: Alert & oriented x 3.    No results found for this or any previous visit (from the past 24 hour(s)).  Pertinent Results:  Prenatal Labs: Blood type/Rh A neg   Antibody screen neg  Rubella Immune  Varicella Immune  RPR NR  HBsAg Neg  HIV NR  GC neg  Chlamydia neg  Genetic screening negative  1 hour GTT Not done   3 hour GTT Not done   GBS Neg at [redacted]w[redacted]d - PCR ordered on admission   FHT: Baseline: 125 bpm, Variability: moderate, Accelerations: present and Decelerations: Absent TOCO: irregular, every 7-10 minutes SVE:  Dilation: 1.5 / Effacement (%): 50 /     Repeat exam 3/70/-1   Cephalic by [place] and SVE   No results found.  Assessment:  Tonya Nichols is a 34 y.o. [id number] female at [redacted]w[redacted]d with early labor.   Plan:  1. Admit to Labor & Delivery; consents reviewed and obtained - Covid admission screen - has been exposed to covid and had symptoms about 2 weeks ago. - Dr. Dalbert Garnet updated on plan of care and admission    2. Fetal Well being  - Fetal Tracing: cat 1 - Group B Streptococcus ppx indicated: GBS neg at [redacted]w[redacted]d, repeat GBS PCR ordered on admission - Presentation: cephalic confirmed by SVE   3. Routine OB: - Prenatal labs reviewed, as above - Rh neg - CBC, T&S, RPR on admit - Clear fluids, IVF - UDS    4. Monitoring of labor  -  Contractions monitored with external toco -  Pelvis adequate for trial of labor  -  Plan for expectant management - discussed possible augmentation  -  Plan for  continuous fetal monitoring -  Maternal pain control as desired; - Anticipate vaginal delivery  5. Post Partum Planning: - Infant feeding: TBD - Contraception: desires BTL - consent signed 10/08/2019 - Tdap vaccine: not done d/t 1 prenatal visit  - Flu vaccine: due in season    Gustavo Lah, [place] 11/11/19 1:07 PM  Margaretmary Eddy, CNM Certified Nurse Midwife [facility]

## 2019-11-11 NOTE — Anesthesia Preprocedure Evaluation (Signed)
Anesthesia Evaluation  Patient identified by MRN, date of birth, ID band Patient awake    Reviewed: Allergy & Precautions, H&P , NPO status , Patient's Chart, lab work & pertinent test results  History of Anesthesia Complications Negative for: history of anesthetic complications  Airway Mallampati: II  TM Distance: >3 FB Neck ROM: full    Dental no notable dental hx.    Pulmonary asthma , Current Smoker and Patient abstained from smoking.,   Dry cough Pulmonary exam normal        Cardiovascular Exercise Tolerance: Good negative cardio ROS Normal cardiovascular exam     Neuro/Psych Anxiety Depression negative neurological ROS     GI/Hepatic Neg liver ROS, GERD  Controlled,  Endo/Other  negative endocrine ROS  Renal/GU negative Renal ROS  negative genitourinary   Musculoskeletal   Abdominal   Peds  Hematology  (+) Blood dyscrasia, anemia ,   Anesthesia Other Findings   Reproductive/Obstetrics (+) Pregnancy                             Anesthesia Physical Anesthesia Plan  ASA: II  Anesthesia Plan: Epidural   Post-op Pain Management:    Induction:   PONV Risk Score and Plan:   Airway Management Planned:   Additional Equipment:   Intra-op Plan:   Post-operative Plan:   Informed Consent: I have reviewed the patients History and Physical, chart, labs and discussed the procedure including the risks, benefits and alternatives for the proposed anesthesia with the patient or authorized representative who has indicated his/her understanding and acceptance.     Dental Advisory Given  Plan Discussed with: Anesthesiologist  Anesthesia Plan Comments:         Anesthesia Quick Evaluation

## 2019-11-11 NOTE — Anesthesia Procedure Notes (Signed)
Epidural Patient location during procedure: OB Start time: 11/11/2019 3:48 PM End time: 11/11/2019 4:03 PM  Staffing Anesthesiologist: Piscitello, Cleda Mccreedy, MD Resident/CRNA: Ginger Carne, CRNA Performed: resident/CRNA   Preanesthetic Checklist Completed: patient identified, IV checked, site marked, risks and benefits discussed, surgical consent, monitors and equipment checked, pre-op evaluation and timeout performed  Epidural Patient position: sitting Prep: ChloraPrep Patient monitoring: heart rate, continuous pulse ox and blood pressure Approach: midline Location: L3-L4 Injection technique: LOR saline  Needle:  Needle type: Tuohy  Needle gauge: 17 G Needle length: 9 cm and 9 Needle insertion depth: 8 cm Catheter type: closed end flexible Catheter size: 19 Gauge Catheter at skin depth: 13 cm Test dose: negative and 1.5% lidocaine with Epi 1:200 K  Assessment Sensory level: T10 Events: blood not aspirated, injection not painful, no injection resistance, no paresthesia and negative IV test  Additional Notes 1 attempt Pt. Evaluated and documentation done after procedure finished. Patient identified. Risks/Benefits/Options discussed with patient including but not limited to bleeding, infection, nerve damage, paralysis, failed block, incomplete pain control, headache, blood pressure changes, nausea, vomiting, reactions to medication both or allergic, itching and postpartum back pain. Confirmed with bedside nurse the patient's most recent platelet count. Confirmed with patient that they are not currently taking any anticoagulation, have any bleeding history or any family history of bleeding disorders. Patient expressed understanding and wished to proceed. All questions were answered. Sterile technique was used throughout the entire procedure. Please see nursing notes for vital signs. Test dose was given through epidural catheter and negative prior to continuing to dose epidural or  start infusion. Warning signs of high block given to the patient including shortness of breath, tingling/numbness in hands, complete motor block, or any concerning symptoms with instructions to call for help. Patient was given instructions on fall risk and not to get out of bed. All questions and concerns addressed with instructions to call with any issues or inadequate analgesia.   Patient tolerated the insertion well without immediate complications.Reason for block:procedure for pain

## 2019-11-11 NOTE — Discharge Summary (Signed)
Obstetrical Discharge Summary  Patient Name: Tonya Nichols DOB: 1985-10-09 MRN: [id number]  Date of Admission: 11/11/2019 Date of Discharge: 11/12/2019  Primary OB: [facility]  Gestational Age at Delivery: [redacted]w[redacted]d   Antepartum complications:   Pregnancy Issues: 1. Inadequate prenatal care - prenatal visit x 1 at 34 weeks  2. AMA - 34 years old at time of delivery  3. Obesity - BMI 36 4. 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 UDS neg on admission. 6. Tobacco use in pregnancy  7. Closely spaced pregnancy - last delivery 04/2018 8. Rh negative - Rhogam given 09/26/2019  Admitting Diagnosis: active labor Secondary Diagnosis: recent covid infection, symptoms of loss of taste and smell started >14days ago, will treat as possibly infectious during this admission  Patient Active Problem List   Diagnosis Date Noted   NSVD (normal spontaneous vaginal delivery) 11/12/2019   Rh negative, antepartum 03/19/2018   Alcohol abuse    Grand multipara    Cocaine use complicating pregnancy    Marijuana abuse     Augmentation: none Complications: None Intrapartum complications/course: NSVD without complication Date of Delivery: 11/11/19 Delivered By: Christeen Douglas Delivery Type: spontaneous vaginal delivery Anesthesia: epidural Placenta: Spontaneous Laceration: none Episiotomy: none Newborn Data: Live born female  Birth Weight: 6 lb 6.3 oz (2900 g) APGAR: 8, 9  Newborn Delivery   Birth date/time: 11/11/2019 18:41:00 Delivery type: Vaginal, Spontaneous       Brief Hospital Course  Porschea Borys Grizzell is a [id number] who had a SVD on 11/11/19;  for further details of this delivery, please refer to the delivery note.  Patient had an uncomplicated postpartum course.  By time of discharge on PPD#1, her pain was controlled on oral pain medications; she had appropriate lochia and was ambulating, voiding without difficulty and tolerating regular diet.   She was deemed stable for discharge to home.    Discharge Physical Exam:  BP 106/68 (BP Location: Right Arm)    Pulse 63    Temp 98.5 F (36.9 C) (Axillary)    Resp 18    Ht 5\' 4"  (1.626 m)    Wt 94.3 kg    LMP 12/28/2018 (Within Weeks) Comment: Normal period   SpO2 99%    Breastfeeding Unknown    BMI 35.70 kg/m   General: NAD CV: RRR Pulm: CTABL, nl effort ABD: s/nd/nt, fundus firm and below the umbilicus Lochia: moderate DVT Evaluation: LE non-ttp, no evidence of DVT on exam.  Hemoglobin  Date Value Ref Range Status  11/12/2019 11.8 (L) 12.0 - 15.0 g/dL Final   HGB  Date Value Ref Range Status  04/06/2013 11.9 (L) 12.0 - 16.0 g/dL Final   HCT  Date Value Ref Range Status  11/12/2019 34.4 (L) 36 - 46 % Final  04/07/2013 31.8 (L) 35.0 - 47.0 % Final    Post partum course: uncomplicated,  Postpartum Procedures: none Edinburgh:  Edinburgh Postnatal Depression Scale Screening Tool 11/12/2019  I have been able to laugh and see the funny side of things. 0  I have looked forward with enjoyment to things. 0  I have blamed myself unnecessarily when things went wrong. 0  I have been anxious or worried for no good reason. 0  I have felt scared or panicky for no good reason. 0  Things have been getting on top of me. 0  I have been so unhappy that I have had difficulty sleeping. 0  I have felt sad or  miserable. 0  I have been so unhappy that I have been crying. 0  The thought of harming myself has occurred to me. 0  Edinburgh Postnatal Depression Scale Total 0    Disposition: stable, discharge to home. Baby Feeding: breastmilk Baby Disposition: home with mom  Rh Immune globulin to be given before discharge Rubella vaccine given:   Flu vaccine given in AP or PP setting:  Tdap vaccine given in AP or PP setting:    Contraception: IUD - already at [place] [facility] for her  Prenatal Labs: Blood type/Rh --/--/A NEG (09/07 1254)  Antibody screen neg  Rubella Immune  Varicella  Immune  RPR NR  HBsAg Neg  HIV NR  GC neg  Chlamydia neg  Genetic screening   1 hour GTT Not done  3 hour GTT Not done  GBS neg     Plan:  Camya L Solarz was discharged to home in good condition. Follow-up appointment at [facility]  with delivering provider in 6 weeks  Discussion of postpartum contraception with patient. I asked her about her BTL papers, signed at 34wks, and she started crying, saying she didn't want permanent sterilization. We have decided on postpartum IUD, and she asked her adult daughter present to make sure she came to her postpartum visit to get that IUD.    Discharge Medications: Allergies as of 11/12/2019   No Known Allergies     Medication List    STOP taking these medications   acetaminophen 325 MG tablet Commonly known as: TYLENOL   sertraline 50 MG tablet Commonly known as: Zoloft   sulfamethoxazole-trimethoprim 800-160 MG tablet Commonly known as: BACTRIM DS     TAKE these medications   diphenhydrAMINE 25 MG tablet Commonly known as: BENADRYL Take 25 mg by mouth every 6 (six) hours as needed.   ibuprofen 800 MG tablet Commonly known as: ADVIL Take 1 tablet (800 mg total) by mouth every 8 (eight) hours as needed. What changed:   medication strength  how much to take  when to take this  reasons to take this   multivitamin-prenatal 27-0.8 MG Tabs tablet Take 1 tablet by mouth daily at 12 noon.   oxyCODONE 5 MG immediate release tablet Commonly known as: Oxy IR/ROXICODONE Take 1 tablet (5 mg total) by mouth every 4 (four) hours as needed (pain scale 4-7).        Follow-up Information    Christeen Douglas, MD. Schedule an appointment as soon as possible for a visit in 6 week(s).   Specialty: Obstetrics and Gynecology Contact information: [address] [place] [address] [phone]               Signed: Cyril Mourning 11/12/2019 3:41 PM

## 2019-11-11 NOTE — Progress Notes (Signed)
Labor Progress Note  Tonya Nichols is a 34 y.o. [id number] at [redacted]w[redacted]d by ultrasound admitted for active labor  Subjective: comfortable with epidural   Objective: BP 111/90 (BP Location: Right Arm)   Pulse 83   Temp 97.7 F (36.5 C) (Oral)   Resp 17   Ht 5\' 4"  (1.626 m)   Wt 94.3 kg   LMP 12/28/2018 (Within Weeks) Comment: Normal period  SpO2 98%   BMI 35.70 kg/m  Notable VS details:   Fetal Assessment: FHT:  FHR: 120 bpm, variability: moderate,  accelerations:  Present,  decelerations:  Present prolong decel x 1 after epidural and SROM Category/reactivity:  Category II UC:   regular, every 5-7 minutes SVE:   5/80/-1 by RN Membrane status: SROM at 1613 Amniotic color: blood tinged   Labs: Lab Results  Component Value Date   WBC 12.1 (H) 11/11/2019   HGB 13.0 11/11/2019   HCT 37.0 11/11/2019   MCV 83.1 11/11/2019   PLT 167 11/11/2019    Assessment / Plan: Spontaneous labor, progressing normally  Labor: Progressing normally Preeclampsia:  no s/s Fetal Wellbeing:  Category II - overall reassuring with moderate variability and accels after prolong decel.  Occurred after epidural and SROM. Spontaneous recovery.  Now Cat I tracing.  Pain Control:  Epidural I/D:  n/a   01/11/2020, CNM 11/11/2019, 4:54 PM

## 2019-11-12 LAB — CBC
HCT: 34.4 % — ABNORMAL LOW (ref 36.0–46.0)
Hemoglobin: 11.8 g/dL — ABNORMAL LOW (ref 12.0–15.0)
MCH: 29.5 pg (ref 26.0–34.0)
MCHC: 34.3 g/dL (ref 30.0–36.0)
MCV: 86 fL (ref 80.0–100.0)
Platelets: 147 10*3/uL — ABNORMAL LOW (ref 150–400)
RBC: 4 MIL/uL (ref 3.87–5.11)
RDW: 14.6 % (ref 11.5–15.5)
WBC: 17.4 10*3/uL — ABNORMAL HIGH (ref 4.0–10.5)
nRBC: 0 % (ref 0.0–0.2)

## 2019-11-12 LAB — TYPE AND SCREEN
ABO/RH(D): A NEG
Antibody Screen: POSITIVE

## 2019-11-12 LAB — RPR: RPR Ser Ql: NONREACTIVE

## 2019-11-12 MED ORDER — OXYCODONE HCL 5 MG PO TABS
5.0000 mg | ORAL_TABLET | ORAL | 0 refills | Status: DC | PRN
Start: 2019-11-12 — End: 2021-06-10

## 2019-11-12 MED ORDER — MENTHOL 3 MG MT LOZG
1.0000 | LOZENGE | OROMUCOSAL | Status: DC | PRN
  Filled 2019-11-12: qty 9

## 2019-11-12 MED ORDER — IBUPROFEN 800 MG PO TABS: 800.0000 mg | ORAL_TABLET | Freq: Three times a day (TID) | ORAL | 0 refills | Status: DC | PRN

## 2019-11-12 MED ORDER — IBUPROFEN 600 MG PO TABS
600.0000 mg | ORAL_TABLET | Freq: Four times a day (QID) | ORAL | Status: DC
  Administered 2019-11-12 (×3): 600 mg via ORAL
  Filled 2019-11-12 (×3): qty 1

## 2019-11-12 NOTE — Progress Notes (Signed)
Telephone number of pt:  [phone] Telephone number of father of baby:  [phone] (staphon curtis)

## 2019-11-12 NOTE — Progress Notes (Signed)
All discharge instructions completed with pt at 2055; pt going home with new baby and significant other; pt holding baby in lap already in car seat and RN and nurse tech escort them to their car

## 2019-11-12 NOTE — Anesthesia Postprocedure Evaluation (Signed)
Anesthesia Post Note  Patient: Tonya Nichols  Procedure(s) Performed: AN AD HOC LABOR EPIDURAL  Patient location during evaluation: Mother Baby Anesthesia Type: Epidural Level of consciousness: awake and alert and oriented Pain management: pain level controlled Vital Signs Assessment: post-procedure vital signs reviewed and stable Respiratory status: respiratory function stable Cardiovascular status: stable Postop Assessment: no headache, no backache, no apparent nausea or vomiting, patient able to bend at knees, adequate PO intake and able to ambulate Anesthetic complications: no   No complications documented.   Last Vitals:  Vitals:   11/11/19 1945 11/12/19 0430  BP: (!) 129/57 115/81  Pulse: 84 73  Resp:  16  Temp:  36.7 C  SpO2:      Last Pain:  Vitals:   11/12/19 0430  TempSrc: Oral  PainSc:                  Zachary George

## 2019-11-12 NOTE — Discharge Instructions (Signed)

## 2019-11-12 NOTE — Progress Notes (Addendum)
CSW received a consult for: drug exposed newborn. Referral was screened out due to MOB having no positive drug screens during this pregnancy.   There are also notes that say patient had late prenatal care. Per chart review, MOB also had 3 prenatal visits and prenatal care began on 08/12/19 at [redacted] weeks gestation therefore it would not be classified as late/limited prenatal care per TOC guidelines.  CSW spoke with patient's RN Kendra who reported patient is appropriate and denied any concerns at this time.Please consult CSW if additional concerns arise or by MOB's request.  Baby will not be drug screened due to MOB not having any positive screens during her pregnancy.  Jeston Junkins, LCSW [phone]  

## 2020-03-18 IMAGING — US US OB COMP +14 WK
1 series · 14 of 28 positions shown · non-contrast
Comparison: none

CLINICAL DATA: Right-sided abdominal pain.  No prenatal care.

EXAM:
LIMITED OBSTETRIC ULTRASOUND

[Series 1: us ob comp +14 wk · 0.26mm/px · 14 of 74 slices shown]
[im 3/74]
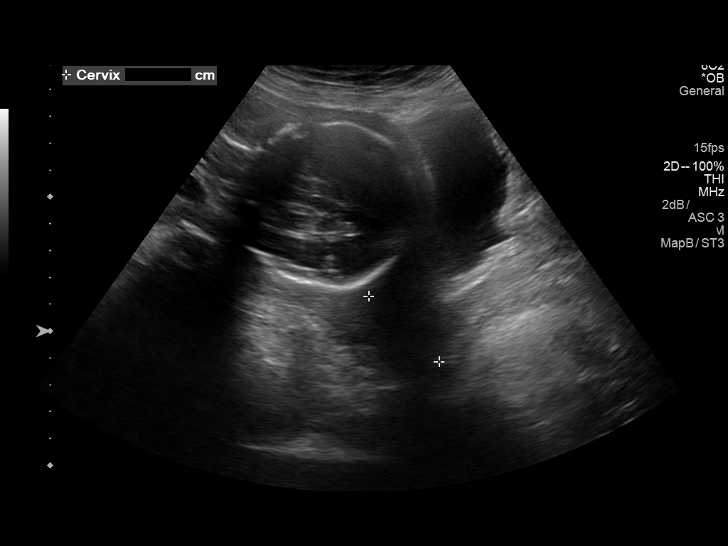
[im 9/74]
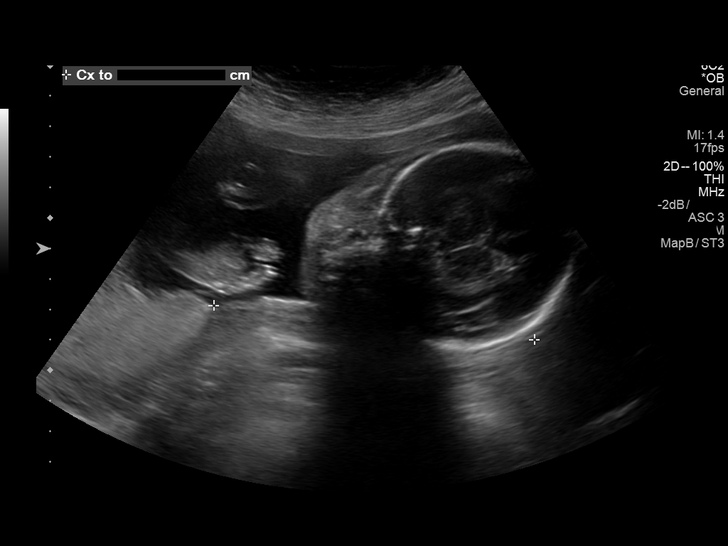
[im 14/74]
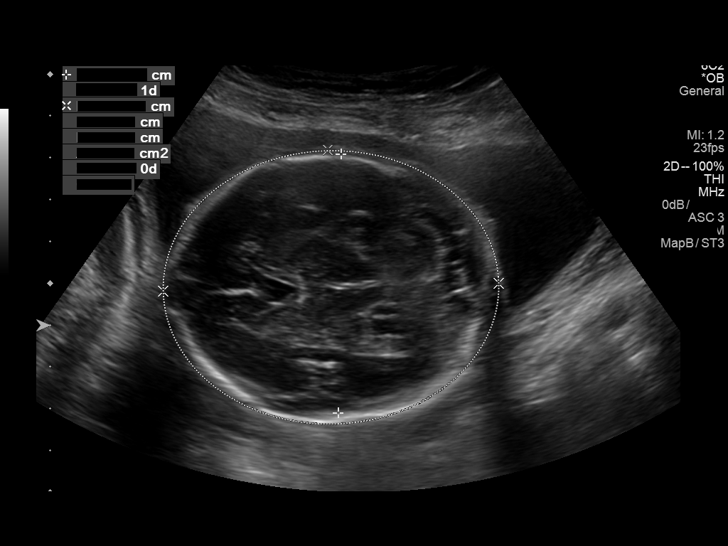
[im 19/74]
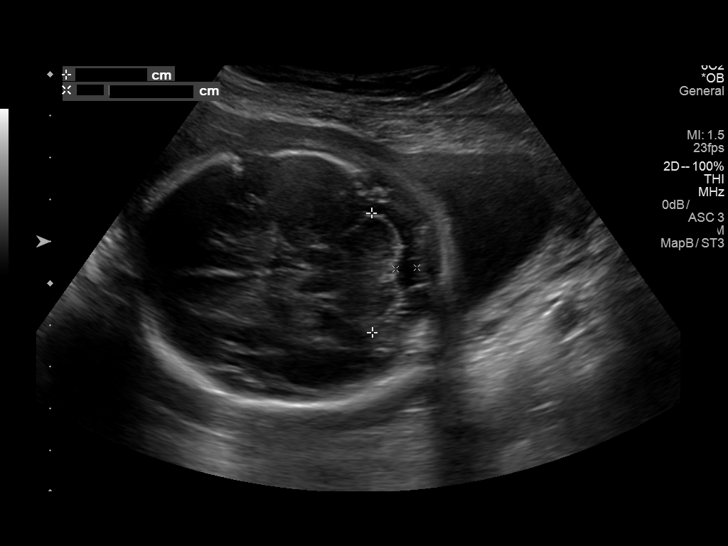
[im 25/74]
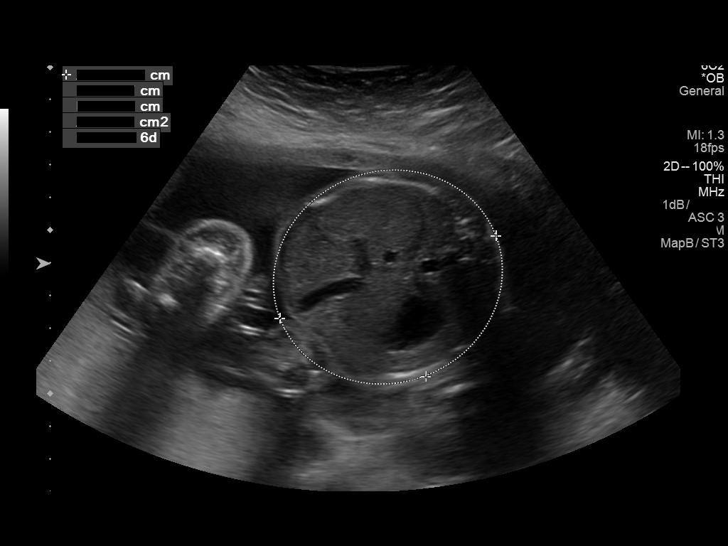
[im 30/74]
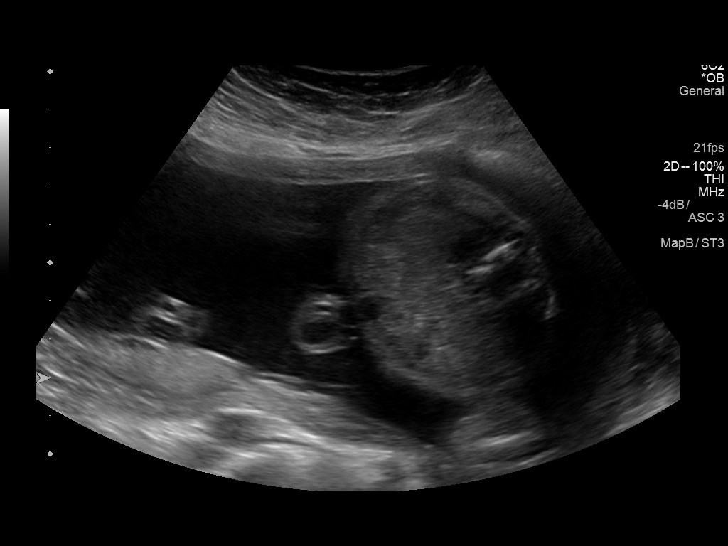
[im 36/74]
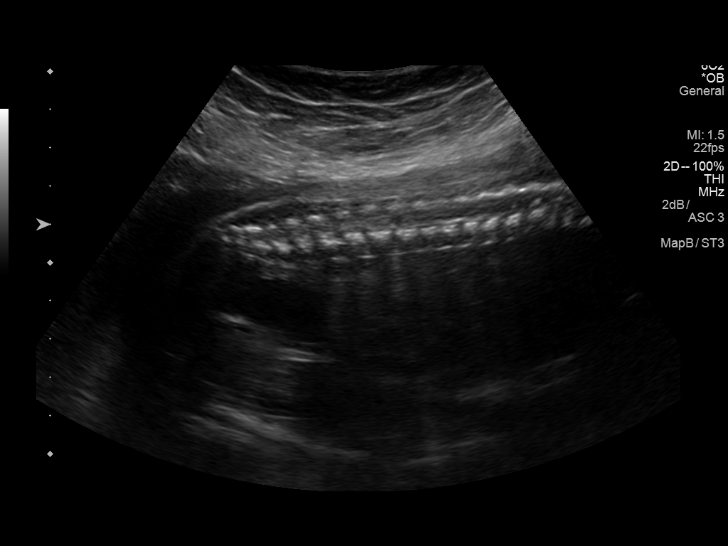
[im 41/74]
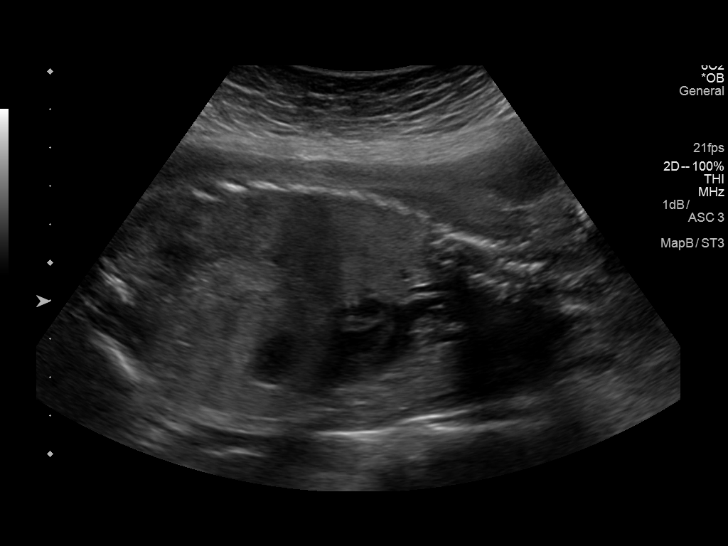
[im 46/74]
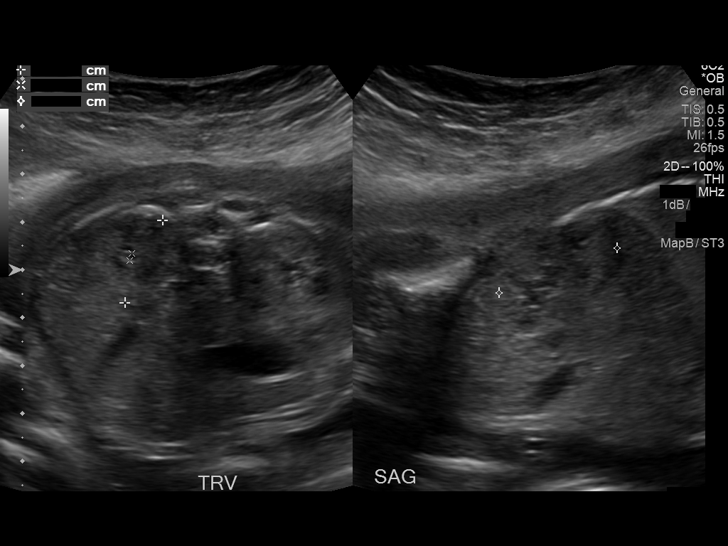
[im 52/74]
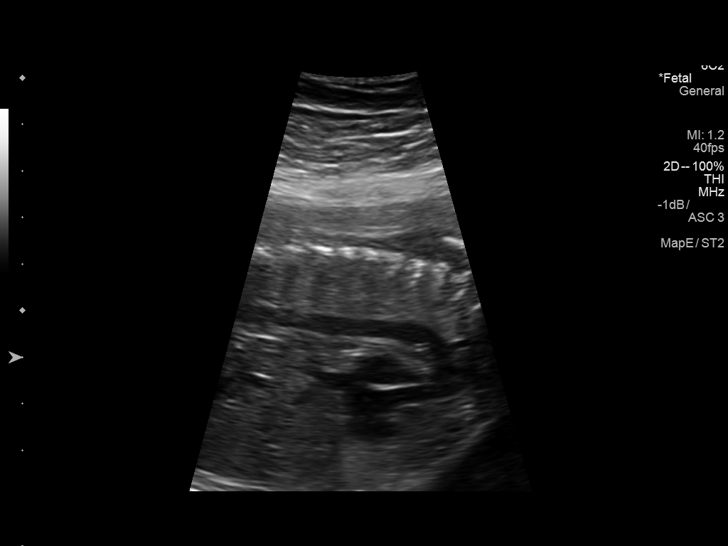
[im 57/74]
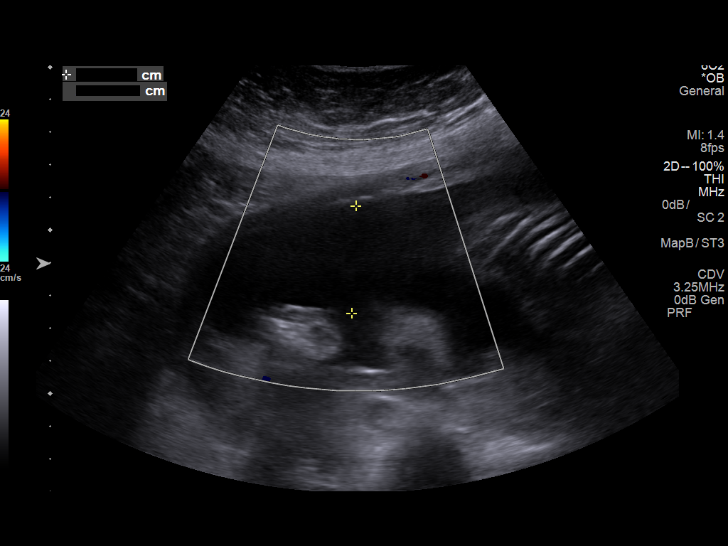
[im 63/74]
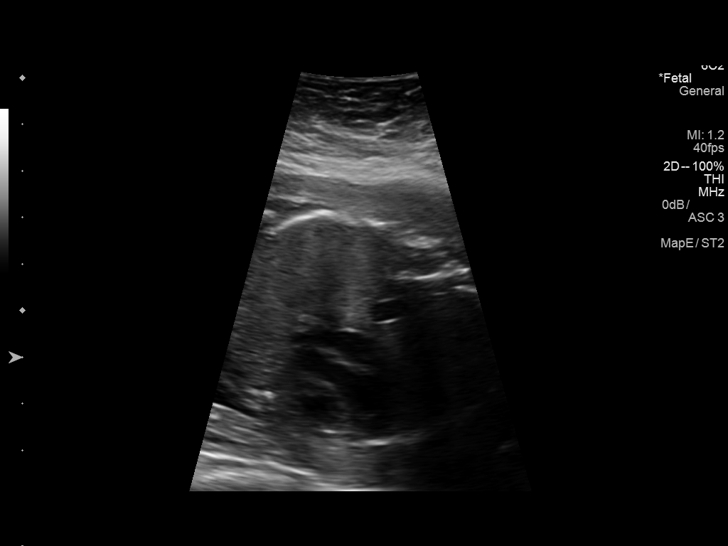
[im 68/74]
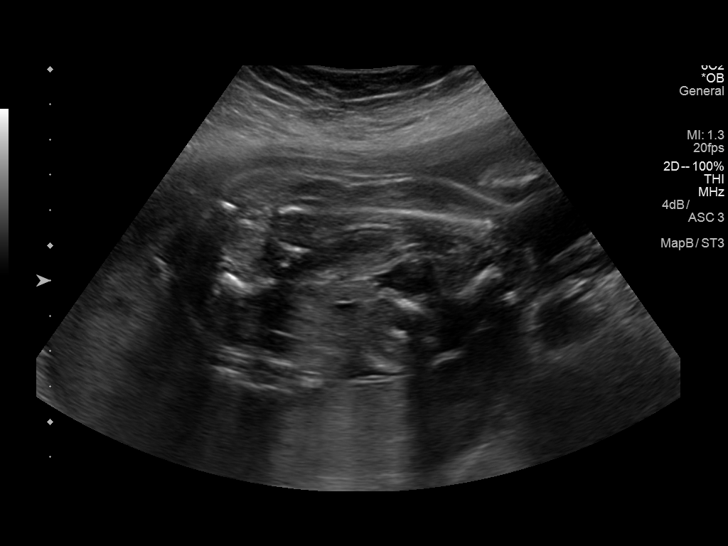
[im 74/74]
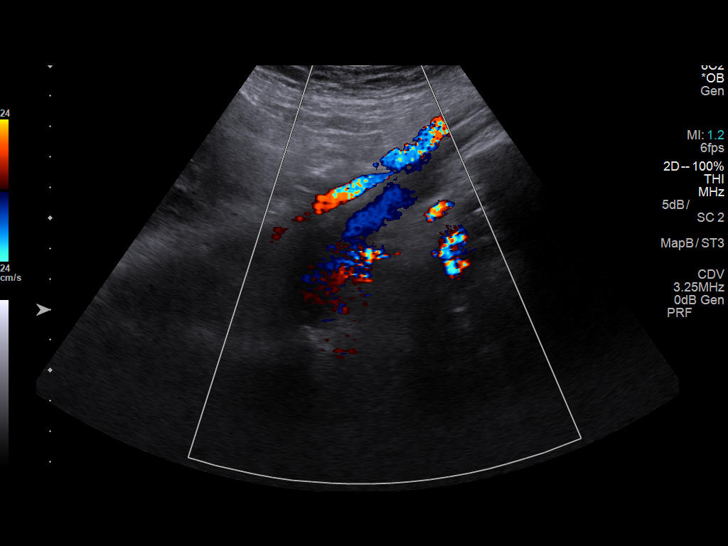

[14 of 28 positions shown; findings below may reference images not displayed]

FINDINGS: Number of Fetuses: 1

Heart Rate:  153 bpm

Movement: Present

Presentation: Cephalic

Placental Location: Posterior fundal

Previa: No

Amniotic Fluid (Subjective):  Normal

AFI: 6.8 cm

BPD: 6.4 cm 25 w  6 d

MATERNAL FINDINGS:

Cervix:  Appears closed.

Uterus/Adnexae: No abnormality visualized.
IMPRESSION: Single viable intrauterine pregnancy at 25 weeks 6 days. Cephalic
presentation.

This exam is performed on an emergent basis and does not
comprehensively evaluate fetal size, dating, or anatomy; follow-up
complete OB US should be considered if further fetal assessment is
warranted.

## 2020-05-23 ENCOUNTER — Emergency Department
Admission: EM | Admit: 2020-05-23 | Discharge: 2020-05-24 | Disposition: A | Discharge status: Home / Self Care | Payer: Medicaid Other | Attending: Emergency Medicine | Admitting: Emergency Medicine

## 2020-05-23 ENCOUNTER — Emergency Department: Payer: Medicaid Other

## 2020-05-23 ENCOUNTER — Other Ambulatory Visit: Payer: Self-pay

## 2020-05-23 DIAGNOSIS — J45909 Unspecified asthma, uncomplicated: Secondary | ICD-10-CM | POA: Insufficient documentation

## 2020-05-23 DIAGNOSIS — O209 Hemorrhage in early pregnancy, unspecified: Secondary | ICD-10-CM | POA: Diagnosis present

## 2020-05-23 DIAGNOSIS — O4692 Antepartum hemorrhage, unspecified, second trimester: Secondary | ICD-10-CM

## 2020-05-23 DIAGNOSIS — O3680X0 Pregnancy with inconclusive fetal viability, not applicable or unspecified: Secondary | ICD-10-CM | POA: Diagnosis not present

## 2020-05-23 DIAGNOSIS — F17210 Nicotine dependence, cigarettes, uncomplicated: Secondary | ICD-10-CM | POA: Diagnosis not present

## 2020-05-23 DIAGNOSIS — O99331 Smoking (tobacco) complicating pregnancy, first trimester: Secondary | ICD-10-CM | POA: Diagnosis not present

## 2020-05-23 DIAGNOSIS — Z2913 Encounter for prophylactic Rho(D) immune globulin: Secondary | ICD-10-CM | POA: Insufficient documentation

## 2020-05-23 DIAGNOSIS — O0289 Other abnormal products of conception: Secondary | ICD-10-CM

## 2020-05-23 DIAGNOSIS — Z3A01 Less than 8 weeks gestation of pregnancy: Secondary | ICD-10-CM | POA: Insufficient documentation

## 2020-05-23 DIAGNOSIS — O99511 Diseases of the respiratory system complicating pregnancy, first trimester: Secondary | ICD-10-CM | POA: Insufficient documentation

## 2020-05-23 LAB — CBC WITH DIFFERENTIAL/PLATELET
Abs Immature Granulocytes: 0.08 10*3/uL — ABNORMAL HIGH (ref 0.00–0.07)
Basophils Absolute: 0.1 10*3/uL (ref 0.0–0.1)
Basophils Relative: 1 %
Eosinophils Absolute: 0 10*3/uL (ref 0.0–0.5)
Eosinophils Relative: 0 %
HCT: 37.5 % (ref 36.0–46.0)
Hemoglobin: 13.1 g/dL (ref 12.0–15.0)
Immature Granulocytes: 1 %
Lymphocytes Relative: 18 %
Lymphs Abs: 2.3 10*3/uL (ref 0.7–4.0)
MCH: 28.9 pg (ref 26.0–34.0)
MCHC: 34.9 g/dL (ref 30.0–36.0)
MCV: 82.8 fL (ref 80.0–100.0)
Monocytes Absolute: 0.9 10*3/uL (ref 0.1–1.0)
Monocytes Relative: 7 %
Neutro Abs: 9.4 10*3/uL — ABNORMAL HIGH (ref 1.7–7.7)
Neutrophils Relative %: 73 %
Platelets: 265 10*3/uL (ref 150–400)
RBC: 4.53 MIL/uL (ref 3.87–5.11)
RDW: 13.6 % (ref 11.5–15.5)
WBC: 12.7 10*3/uL — ABNORMAL HIGH (ref 4.0–10.5)
nRBC: 0 % (ref 0.0–0.2)

## 2020-05-23 LAB — URINALYSIS, COMPLETE (UACMP) WITH MICROSCOPIC
Bacteria, UA: NONE SEEN
Bilirubin Urine: NEGATIVE
Glucose, UA: NEGATIVE mg/dL
Ketones, ur: 20 mg/dL — AB
Leukocytes,Ua: NEGATIVE
Nitrite: NEGATIVE
Protein, ur: NEGATIVE mg/dL
Specific Gravity, Urine: 1.004 — ABNORMAL LOW (ref 1.005–1.030)
pH: 6 (ref 5.0–8.0)

## 2020-05-23 LAB — COMPREHENSIVE METABOLIC PANEL
ALT: 28 U/L (ref 0–44)
AST: 28 U/L (ref 15–41)
Albumin: 4.2 g/dL (ref 3.5–5.0)
Alkaline Phosphatase: 68 U/L (ref 38–126)
Anion gap: 11 (ref 5–15)
BUN: 11 mg/dL (ref 6–20)
CO2: 20 mmol/L — ABNORMAL LOW (ref 22–32)
Calcium: 8.6 mg/dL — ABNORMAL LOW (ref 8.9–10.3)
Chloride: 103 mmol/L (ref 98–111)
Creatinine, Ser: 0.63 mg/dL (ref 0.44–1.00)
GFR, Estimated: >60 mL/min (ref >60)
Glucose, Bld: 88 mg/dL (ref 70–99)
Potassium: 3.5 mmol/L (ref 3.5–5.1)
Sodium: 134 mmol/L — ABNORMAL LOW (ref 135–145)
Total Bilirubin: 1.6 mg/dL — ABNORMAL HIGH (ref 0.3–1.2)
Total Protein: 7.6 g/dL (ref 6.5–8.1)

## 2020-05-23 LAB — TYPE AND SCREEN

## 2020-05-23 LAB — PROTIME-INR
INR: 1 (ref 0.8–1.2)
Prothrombin Time: 12.8 seconds (ref 11.4–15.2)

## 2020-05-23 LAB — HCG, QUANTITATIVE, PREGNANCY: hCG, Beta Chain, Quant, S: 8606 m[IU]/mL — ABNORMAL HIGH (ref <5)

## 2020-05-23 LAB — POC URINE PREG, ED: Preg Test, Ur: POSITIVE — AB

## 2020-05-23 MED ORDER — ACETAMINOPHEN 500 MG PO TABS
1000.0000 mg | ORAL_TABLET | Freq: Once | ORAL | Status: AC
  Administered 2020-05-23: 1000 mg via ORAL
  Filled 2020-05-23: qty 2

## 2020-05-23 MED ORDER — RHO D IMMUNE GLOBULIN 1500 UNIT/2ML IJ SOSY
300.0000 ug | PREFILLED_SYRINGE | Freq: Once | INTRAMUSCULAR | Status: DC
  Filled 2020-05-23: qty 2

## 2020-05-23 MED ORDER — IBUPROFEN 800 MG PO TABS
800.0000 mg | ORAL_TABLET | Freq: Once | ORAL | Status: AC
  Administered 2020-05-23: 800 mg via ORAL
  Filled 2020-05-23: qty 1

## 2020-05-23 NOTE — ED Triage Notes (Signed)
Pt presents to ER via ems with c/o vaginal bleeding that started around 0700 today.  Pt states she has gone through one pad today, but was bleeding through her pants earlier this morning.  Pt states bleeding is a little lighter now.  Pt reports lower abdominal pain that radiates into back.  Pt is G11P10.  LMP 02/03/20.    EMS VS: HR 108, BP 147/72, 95% RA, BGL 108.

## 2020-05-23 NOTE — ED Provider Notes (Signed)
[facility] Emergency Department Provider Note  ____________________________________________   Event Date/Time   First MD Initiated Contact with Patient 05/23/20 2251     (approximate)  I have reviewed the triage vital signs and the nursing notes.   HISTORY  Chief Complaint Vaginal Bleeding   HPI Tonya Nichols is a 35 y.o. female G11P10 unsure how far along current pregnancy she is with a past medical history of EtOH use, anemia, asthma, previous cocaine abuse and UTI who presents for assessment of some lower abdominal cramping pelvic pain associate with some vaginal bleeding and passage of some blood clots earlier today.  Patient states she had a son 6 months ago and is not sure when her last menstrual period is after this.  She has not seen a gynecologist obstetrician since then.  She is not sure when she had a positive pregnancy test.  She denies any other recent sick symptoms including headache, earache, sore throat, nausea, vomiting, diarrhea, other vaginal bleeding or discharge other than the bleeding noted today, rash, extremity pain or recent falls or injuries.  She is not on any daily medicines.  She denies any recent EtOH use tobacco abuse or illicit drug use.  Denies any recent traumatic injuries or falls.  Denies any known bleeding disorders.  She is never had a miscarriage before.  She states that she does think her bleeding has significantly slowed from when initially started earlier today.         Past Medical History:  Diagnosis Date  . Alcohol abuse    H/O cocaine abuse  . Anemia    First pregnancy  . Asthma    as child  . Chlamydia   . Cocaine use complicating pregnancy   . Depression    H/O post partum depression-2016-took Zoloft x 1/ 1/2 mth  . Gonorrhea   . 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 History of HPV infection   . History of UTI    2016  . Insufficient prenatal care   . Marijuana abuse   . Post partum depression 07/20/2011  . UTI  (lower urinary tract infection)   . Vaginal Pap smear with ASC-US     Patient Active Problem List   Diagnosis Date Noted  . NSVD (normal spontaneous vaginal delivery) 11/12/2019  . Rh negative, antepartum 03/19/2018  . Alcohol abuse   . 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 Cocaine use complicating pregnancy   . Marijuana abuse     Past Surgical History:  Procedure Laterality Date  . denies surgical history    . NO PAST SURGERIES      Prior to Admission medications   Medication Sig Start Date End Date Taking? Authorizing Provider  diphenhydrAMINE (BENADRYL) 25 MG tablet Take 25 mg by mouth every 6 (six) hours as needed.    [provider]  ibuprofen (ADVIL) 800 MG tablet Take 1 tablet (800 mg total) by mouth every 8 (eight) hours as needed. 11/12/19   Haroldine Laws, CNM  oxyCODONE (OXY IR/ROXICODONE) 5 MG immediate release tablet Take 1 tablet (5 mg total) by mouth every 4 (four) hours as needed (pain scale 4-7). 11/12/19   Haroldine Laws, CNM  Prenatal Vit-Fe Fumarate-FA (MULTIVITAMIN-PRENATAL) 27-0.8 MG TABS tablet Take 1 tablet by mouth daily at 12 noon.    [provider]    Allergies Patient has no known allergies.  Family History  Problem Relation Age of Onset  . Hepatitis C Mother   . Hepatitis C Father   .  Cancer Father   . Asthma Daughter   . Anesthesia problems Neg Hx   . Alcohol abuse Neg Hx     Social History Social History   Tobacco Use  . Smoking status: Current Some Day Smoker    Packs/day: 0.10    Years: 14.00    Pack years: 1.40  . Smokeless tobacco: Never Used  . Tobacco comment: currently smokes 3 cigarettes per week  Vaping Use  . Vaping Use: Never used  Substance Use Topics  . Alcohol use: Yes    Comment: occasional  . Drug use: Not Currently    Types: Marijuana, Other-see comments, Cocaine    Comment: last cocaine 1 month ago    Review of Systems  Review of Systems  Constitutional: Negative for chills and fever.  HENT: Negative for  sore throat.   Eyes: Negative for pain.  Respiratory: Negative for cough and stridor.   Cardiovascular: Negative for chest pain.  Gastrointestinal: Positive for abdominal pain. Negative for vomiting.  Genitourinary: Negative for dysuria.  Musculoskeletal: Negative for myalgias.  Skin: Negative for rash.  Neurological: Negative for seizures, loss of consciousness and headaches.  Psychiatric/Behavioral: Negative for suicidal ideas.  All other systems reviewed and are negative.     ____________________________________________   PHYSICAL EXAM:  VITAL SIGNS: ED Triage Vitals  Enc Vitals Group     BP 05/23/20 2022 114/76     Pulse Rate 05/23/20 2022 85     Resp 05/23/20 2022 20     Temp 05/23/20 2022 98.6 F (37 C)     Temp Source 05/23/20 2022 Oral     SpO2 05/23/20 2022 97 %     Weight 05/23/20 2023 200 lb (90.7 kg)     Height 05/23/20 2023 5\' 2"  (1.575 m)     Head Circumference --      Peak Flow --      Pain Score 05/23/20 2023 8     Pain Loc --      Pain Edu? --      Excl. in GC? --    Vitals:   05/23/20 2022 05/23/20 2300  BP: 114/76 131/87  Pulse: 85 88  Resp: 20 16  Temp: 98.6 F (37 C)   SpO2: 97% 96%   Physical Exam Vitals and nursing note reviewed.  Constitutional:      General: She is not in acute distress.    Appearance: She is well-developed.  HENT:     Head: Normocephalic and atraumatic.     Right Ear: External ear normal.     Left Ear: External ear normal.     Nose: Nose normal.  Eyes:     Conjunctiva/sclera: Conjunctivae normal.  Cardiovascular:     Rate and Rhythm: Normal rate and regular rhythm.     Heart sounds: No murmur heard.   Pulmonary:     Effort: Pulmonary effort is normal. No respiratory distress.     Breath sounds: Normal breath sounds.  Abdominal:     Palpations: Abdomen is soft.     Tenderness: There is abdominal tenderness in the suprapubic area. There is no right CVA tenderness or left CVA tenderness.  Musculoskeletal:      Cervical back: Neck supple.  Skin:    General: Skin is warm and dry.     Capillary Refill: Capillary refill takes less than 2 seconds.  Neurological:     Mental Status: She is alert and oriented to person, place, and time.  ____________________________________________   LABS (all labs ordered are listed, but only abnormal results are displayed)  Labs Reviewed  URINALYSIS, COMPLETE (UACMP) WITH MICROSCOPIC - Abnormal; Notable for the following components:      Result Value   Color, Urine STRAW (*)    APPearance CLEAR (*)    Specific Gravity, Urine 1.004 (*)    Hgb urine dipstick LARGE (*)    Ketones, ur 20 (*)    All other components within normal limits  CBC WITH DIFFERENTIAL/PLATELET - Abnormal; Notable for the following components:   WBC 12.7 (*)    Neutro Abs 9.4 (*)    Abs Immature Granulocytes 0.08 (*)    All other components within normal limits  COMPREHENSIVE METABOLIC PANEL - Abnormal; Notable for the following components:   Sodium 134 (*)    CO2 20 (*)    Calcium 8.6 (*)    Total Bilirubin 1.6 (*)    All other components within normal limits  HCG, QUANTITATIVE, PREGNANCY - Abnormal; Notable for the following components:   hCG, Beta Chain, Quant, S 8,606 (*)    All other components within normal limits  POC URINE PREG, ED - Abnormal; Notable for the following components:   Preg Test, Ur Positive (*)    All other components within normal limits  PROTIME-INR  TYPE AND SCREEN  ABO/RH  TYPE AND SCREEN  RHOGAM INJECTION   ____________________________________________  EKG  ____________________________________________  RADIOLOGY  ED MD interpretation: IUP at approximately 6 weeks 6 days with no detectable fetal heart rate consistent with failed nonviable pregnancy.  No other acute intrapelvic abnormality noted.  Official radiology report(s): US OB LESS THAN 14 WEEKS WITH OB TRANSVAGINAL  Result Date: 05/23/2020 CLINICAL DATA:  Bleeding EXAM: OBSTETRIC  <14 WK [place] AND TRANSVAGINAL OB US TECHNIQUE: Both transabdominal and transvaginal ultrasound examinations were performed for complete evaluation of the gestation as well as the maternal uterus, adnexal regions, and pelvic cul-de-sac. Transvaginal technique was performed to assess early pregnancy. COMPARISON:  None. FINDINGS: Intrauterine gestational sac: Single Yolk sac:  Visualized. Embryo:  Visualized. Cardiac Activity: Not Visualized. Heart Rate:   bpm MSD:   mm    w     d CRL:  9.1 mm   6 w   6 d                  [place] EDC: 01/10/2021 Subchorionic hemorrhage:  None visualized. Maternal uterus/adnexae: No adnexal mass or free fluid. IMPRESSION: Six week 6 day intrauterine pregnancy. No fetal heart tones detected. Findings meet definitive criteria for failed pregnancy. This follows SRU consensus guidelines: Diagnostic Criteria for Nonviable Pregnancy Early in the First Trimester. [facility] [phone]. Electronically Signed   By: Charlett Nose M.D.   On: 05/23/2020 21:59    ____________________________________________   PROCEDURES  Procedure(s) performed (including Critical Care):  Procedures   ____________________________________________   INITIAL IMPRESSION / ASSESSMENT AND PLAN / ED COURSE      Patient presents with above-stated reexam for assessment of some lower abdominal pain and vaginal bleeding with some passage of clots in the setting of known pregnancy although she is not sure how far along she is and she has not received any prenatal care thus far.  On arrival she is afebrile and hemodynamically stable.  She does have some mild suprapubic tenderness.  Given she has not had any care for this pregnancy including ultrasound thus far ultrasound obtained which does show evidence of intrauterine pregnancy but does not  appear viable is there is no detectable fetal heart rate.  Suspect this is a early complete miscarriage.  No evidence of ectopic pregnancy.  CBC shows a leukocytosis  with WBC count of 12.7 but hemoglobin of 13.1.  No indication for transfusion patient denies any anemia symptoms including shortness of breath, chest pain, dizziness, headache, or lightheadedness and notes her breathing has bleeding has significantly slowed.  CMP shows no significant electrolyte or metabolic derangements.  hCG is 8606.  Patient's Rh is negative.  RhoGam ordered.  Given stable vitals with reassuring hemoglobin exam and decreasing bleeding believe she is safe for discharge with plan for close outpatient OB follow-up.         ____________________________________________   FINAL CLINICAL IMPRESSION(S) / ED DIAGNOSES  Final diagnoses:  Nonviable pregnancy    Medications  rho (d) immune globulin (RHIG/RHOPHYLAC) injection 300 mcg (has no administration in time range)  acetaminophen (TYLENOL) tablet 1,000 mg (1,000 mg Oral Given 05/23/20 2319)  ibuprofen (ADVIL) tablet 800 mg (800 mg Oral Given 05/23/20 2319)     ED Discharge Orders    None       Note:  This document was prepared using Dragon voice recognition software and may include unintentional dictation errors.   Gilles ChiquitoSmith, Spencer Peterkin P, MD 05/24/20 [phone]

## 2020-05-24 LAB — TYPE AND SCREEN
ABO/RH(D): A NEG
Antibody Screen: NEGATIVE

## 2020-05-24 MED ORDER — RHO D IMMUNE GLOBULIN 1500 UNIT/2ML IJ SOSY
300.0000 ug | PREFILLED_SYRINGE | Freq: Once | INTRAMUSCULAR | Status: AC
  Administered 2020-05-24: 300 ug via INTRAMUSCULAR
  Filled 2020-05-24: qty 2

## 2020-05-24 NOTE — ED Notes (Signed)
IM medication verified with this RN at bedside. Signature found by Clinical research associate on green paper for medical records documentation as well as in chart sign-off.

## 2020-05-25 LAB — RHOGAM INJECTION: Unit division: 0

## 2020-08-31 ENCOUNTER — Ambulatory Visit: Payer: Self-pay

## 2021-03-06 NOTE — L&D Delivery Note (Signed)
Date of delivery:  Estimated Date of Delivery: 09/26/21 Patient's last menstrual period was 12/05/2020 (within months). EGA: [redacted]w[redacted]d  Delivery Note At 2:14 AM a viable female was delivered via Vaginal, Spontaneous  Presentation: Left Occiput Anterior   APGAR: 8, 9     Weight:  2850 g, 6 pounds 5 ounces   Placenta status: Spontaneous, Intact.   Cord: 3 vessels with the following complications: None.  Cord pH: NA  Mom delivered a viable female infant following SROM with RN K. Cevallos present.  The head followed by shoulders, which delivered without difficulty, and the rest of the body.  No nuchal cord noted.  Baby to mom's chest.  Cord clamped and cut after 6 min delay.  Cord blood obtained.  Placenta delivered by [facility] spontaneously, intact, with a 3-vessel cord.    All counts correct.  Hemostasis obtained with IM pitocin and fundal massage.   Anesthesia: None Episiotomy: None Lacerations: None Suture Repair:  NA Est. Blood Loss (mL): 675  Mom to postpartum.  Baby to Couplet care / Skin to Skin.  Tonya Nichols, CNM 09/15/2021, 3:24 AM

## 2021-04-21 ENCOUNTER — Ambulatory Visit (LOCAL_COMMUNITY_HEALTH_CENTER): Payer: Medicaid Other

## 2021-04-21 ENCOUNTER — Other Ambulatory Visit: Payer: Self-pay

## 2021-04-21 VITALS — BP 125/65 | Ht 63.0 in | Wt 195.0 lb

## 2021-04-21 DIAGNOSIS — Z3201 Encounter for pregnancy test, result positive: Secondary | ICD-10-CM | POA: Diagnosis not present

## 2021-04-21 LAB — PREGNANCY, URINE: Preg Test, Ur: POSITIVE — AB

## 2021-04-21 MED ORDER — PRENATAL 27-0.8 MG PO TABS: 1.0000 | ORAL_TABLET | Freq: Every day | ORAL | 0 refills | Status: DC

## 2021-04-21 NOTE — Progress Notes (Signed)
UPT positive. Plans prenatal care at [facility]. LMP 12/05/2020. Reports fetal movement and spotting on and off since LMP. Advised pt to establish prenatal care ASAP. Advised to seek immediate medical attn/go to ER if vaginal bleeding and/or increased abd cramping. Pt in agreement. Jerel Shepherd, RN

## 2021-06-10 ENCOUNTER — Other Ambulatory Visit: Payer: Self-pay

## 2021-06-10 ENCOUNTER — Emergency Department: Payer: Medicaid Other

## 2021-06-10 ENCOUNTER — Observation Stay
Admission: EM | Admit: 2021-06-10 | Discharge: 2021-06-10 | Disposition: A | Discharge status: Home / Self Care | Payer: Medicaid Other | Attending: Obstetrics & Gynecology | Admitting: Obstetrics & Gynecology

## 2021-06-10 DIAGNOSIS — O99332 Smoking (tobacco) complicating pregnancy, second trimester: Secondary | ICD-10-CM | POA: Insufficient documentation

## 2021-06-10 DIAGNOSIS — R109 Unspecified abdominal pain: Secondary | ICD-10-CM | POA: Diagnosis not present

## 2021-06-10 DIAGNOSIS — O0930 Supervision of pregnancy with insufficient antenatal care, unspecified trimester: Secondary | ICD-10-CM

## 2021-06-10 DIAGNOSIS — Z3492 Encounter for supervision of normal pregnancy, unspecified, second trimester: Secondary | ICD-10-CM

## 2021-06-10 DIAGNOSIS — O26892 Other specified pregnancy related conditions, second trimester: Principal | ICD-10-CM | POA: Diagnosis present

## 2021-06-10 DIAGNOSIS — O99512 Diseases of the respiratory system complicating pregnancy, second trimester: Secondary | ICD-10-CM | POA: Diagnosis not present

## 2021-06-10 DIAGNOSIS — O0932 Supervision of pregnancy with insufficient antenatal care, second trimester: Secondary | ICD-10-CM | POA: Diagnosis not present

## 2021-06-10 DIAGNOSIS — F17210 Nicotine dependence, cigarettes, uncomplicated: Secondary | ICD-10-CM | POA: Diagnosis not present

## 2021-06-10 DIAGNOSIS — O360120 Maternal care for anti-D [Rh] antibodies, second trimester, not applicable or unspecified: Secondary | ICD-10-CM

## 2021-06-10 DIAGNOSIS — Z3A24 24 weeks gestation of pregnancy: Secondary | ICD-10-CM | POA: Diagnosis not present

## 2021-06-10 DIAGNOSIS — J45909 Unspecified asthma, uncomplicated: Secondary | ICD-10-CM | POA: Insufficient documentation

## 2021-06-10 LAB — CBC WITH DIFFERENTIAL/PLATELET
Abs Immature Granulocytes: 0.1 10*3/uL — ABNORMAL HIGH (ref 0.00–0.07)
Basophils Absolute: 0.1 10*3/uL (ref 0.0–0.1)
Basophils Relative: 1 %
Eosinophils Absolute: 0.1 10*3/uL (ref 0.0–0.5)
Eosinophils Relative: 1 %
HCT: 36.4 % (ref 36.0–46.0)
Hemoglobin: 11.9 g/dL — ABNORMAL LOW (ref 12.0–15.0)
Immature Granulocytes: 1 %
Lymphocytes Relative: 17 %
Lymphs Abs: 1.7 10*3/uL (ref 0.7–4.0)
MCH: 28.3 pg (ref 26.0–34.0)
MCHC: 32.7 g/dL (ref 30.0–36.0)
MCV: 86.7 fL (ref 80.0–100.0)
Monocytes Absolute: 0.7 10*3/uL (ref 0.1–1.0)
Monocytes Relative: 7 %
Neutro Abs: 7.1 10*3/uL (ref 1.7–7.7)
Neutrophils Relative %: 73 %
Platelets: 195 10*3/uL (ref 150–400)
RBC: 4.2 MIL/uL (ref 3.87–5.11)
RDW: 13.6 % (ref 11.5–15.5)
WBC: 9.7 10*3/uL (ref 4.0–10.5)
nRBC: 0 % (ref 0.0–0.2)

## 2021-06-10 LAB — URINALYSIS, ROUTINE W REFLEX MICROSCOPIC
Bilirubin Urine: NEGATIVE
Glucose, UA: NEGATIVE mg/dL
Hgb urine dipstick: NEGATIVE
Ketones, ur: NEGATIVE mg/dL
Leukocytes,Ua: NEGATIVE
Nitrite: NEGATIVE
Protein, ur: NEGATIVE mg/dL
Specific Gravity, Urine: 1.023 (ref 1.005–1.030)
pH: 6 (ref 5.0–8.0)

## 2021-06-10 LAB — URINE DRUG SCREEN, QUALITATIVE (ARMC ONLY)
Amphetamines, Ur Screen: NOT DETECTED
Barbiturates, Ur Screen: NOT DETECTED
Benzodiazepine, Ur Scrn: NOT DETECTED
Cannabinoid 50 Ng, Ur ~~LOC~~: NOT DETECTED
Cocaine Metabolite,Ur ~~LOC~~: NOT DETECTED
MDMA (Ecstasy)Ur Screen: NOT DETECTED
Methadone Scn, Ur: NOT DETECTED
Opiate, Ur Screen: NOT DETECTED
Phencyclidine (PCP) Ur S: NOT DETECTED
Tricyclic, Ur Screen: NOT DETECTED

## 2021-06-10 LAB — URINALYSIS, COMPLETE (UACMP) WITH MICROSCOPIC

## 2021-06-10 LAB — HEPATITIS B SURFACE ANTIGEN: Hepatitis B Surface Ag: NONREACTIVE

## 2021-06-10 LAB — COMPREHENSIVE METABOLIC PANEL
ALT: 10 U/L (ref 0–44)
AST: 20 U/L (ref 15–41)
Albumin: 3 g/dL — ABNORMAL LOW (ref 3.5–5.0)
Alkaline Phosphatase: 45 U/L (ref 38–126)
Anion gap: 10 (ref 5–15)
BUN: 11 mg/dL (ref 6–20)
CO2: 19 mmol/L — ABNORMAL LOW (ref 22–32)
Calcium: 8.5 mg/dL — ABNORMAL LOW (ref 8.9–10.3)
Chloride: 109 mmol/L (ref 98–111)
Creatinine, Ser: 0.79 mg/dL (ref 0.44–1.00)
GFR, Estimated: >60 mL/min (ref >60)
Glucose, Bld: 123 mg/dL — ABNORMAL HIGH (ref 70–99)
Potassium: 4.1 mmol/L (ref 3.5–5.1)
Sodium: 138 mmol/L (ref 135–145)
Total Bilirubin: 0.5 mg/dL (ref 0.3–1.2)
Total Protein: 6.7 g/dL (ref 6.5–8.1)

## 2021-06-10 LAB — MAGNESIUM: Magnesium: 1.8 mg/dL (ref 1.7–2.4)

## 2021-06-10 LAB — ABO/RH: ABO/RH(D): A NEG

## 2021-06-10 LAB — HCG, QUANTITATIVE, PREGNANCY: hCG, Beta Chain, Quant, S: 14133 m[IU]/mL — ABNORMAL HIGH (ref <5)

## 2021-06-10 MED ORDER — ACETAMINOPHEN 500 MG PO TABS
1000.0000 mg | ORAL_TABLET | Freq: Once | ORAL | Status: AC
  Administered 2021-06-10: 1000 mg via ORAL
  Filled 2021-06-10: qty 2

## 2021-06-10 MED ORDER — LACTATED RINGERS IV BOLUS: 1000.0000 mL | Freq: Once | INTRAVENOUS | Status: DC

## 2021-06-10 MED ORDER — LACTATED RINGERS IV SOLN: INTRAVENOUS | Status: DC

## 2021-06-10 NOTE — Progress Notes (Signed)
Discharged to home. Appointment already scheduled @ [facility] for 4/17. Left floor ambulatory by self. Angelik Walls, Tresa Moore S ? ?

## 2021-06-10 NOTE — ED Notes (Signed)
US at bedside

## 2021-06-10 NOTE — ED Triage Notes (Signed)
Pt states she thinks she is about 6 months pregnant with no prenatal care with c/o having contractions states this is her 12th pregnancy(G12P10). Denies any bleeding or leaking fluid. Pt is in NAD on arrival ?

## 2021-06-10 NOTE — Discharge Summary (Signed)
Physician Final Progress Note ? ?Patient ID: ?Tonya Nichols ?MRN: [id number] ?DOB/AGE: 1985-10-23 36 y.o. ? ?Admit date: 06/10/2021 ?Admitting provider: Rod Can, CNM ?Discharge date: 06/10/2021 ? ? ?Admission Diagnoses:  ?1) intrauterine pregnancy at [redacted]w[redacted]d  ?2) abdominal pain during pregnancy ? ?Discharge Diagnoses:  ?Principal Problem: ?  Abdominal pain during pregnancy in second trimester ?Active Problems: ?  No prenatal care in current pregnancy ?  Second trimester pregnancy ? Rh negative state ? ?History of Present Illness: The patient is a 36 y.o. female [id number] at [redacted]w[redacted]d by ultrasound today who presents for worry that she didn't know how far along she was in her pregnancy and thinking she needed Rhogam injection. She has had some mild discomfort in her abdomen and attributes that to her worry. She reports fetal movement for approximately the past 3 months. She does not know when her last period was and she has not had any prenatal care with this pregnancy. She does have an appointment at the health department to establish care in the next 2 weeks. She denies vaginal bleeding, leakage of fluid or contractions.   ? ?She is advised that Rhogam is given at 28 weeks and earlier in the pregnancy if there is vaginal bleeding. ? ?Past Medical History:  ?Diagnosis Date  ? Alcohol abuse   ? H/O cocaine abuse  ? Anemia   ? First pregnancy  ? Asthma   ? as child  ? Chlamydia   ? Cocaine use complicating pregnancy   ? Depression   ? H/O post partum depression-2016-took Zoloft x 1/ 1/2 mth  ? Gonorrhea   ? Aten multipara   ? History of HPV infection   ? History of UTI   ? 2016  ? Insufficient prenatal care   ? Marijuana abuse   ? Post partum depression 07/20/2011  ? UTI (lower urinary tract infection)   ? Vaginal Pap smear with ASC-US   ? ? ?Past Surgical History:  ?Procedure Laterality Date  ? denies surgical history    ? NO PAST SURGERIES    ? ? ?No current facility-administered medications on file prior to encounter.   ? ?Current Outpatient Medications on File Prior to Encounter  ?Medication Sig Dispense Refill  ? diphenhydrAMINE (BENADRYL) 25 MG tablet Take 25 mg by mouth every 6 (six) hours as needed.    ? Prenatal Vit-Fe Fumarate-FA (MULTIVITAMIN-PRENATAL) 27-0.8 MG TABS tablet Take 1 tablet by mouth daily at 12 noon.    ? ibuprofen (ADVIL) 800 MG tablet Take 1 tablet (800 mg total) by mouth every 8 (eight) hours as needed. (Patient not taking: Reported on 04/21/2021) 30 tablet 0  ? oxyCODONE (OXY IR/ROXICODONE) 5 MG immediate release tablet Take 1 tablet (5 mg total) by mouth every 4 (four) hours as needed (pain scale 4-7). (Patient not taking: Reported on 04/21/2021) 30 tablet 0  ? Prenatal Vit-Fe Fumarate-FA (MULTIVITAMIN-PRENATAL) 27-0.8 MG TABS tablet Take 1 tablet by mouth daily at 12 noon. 100 tablet 0  ? ? ?No Known Allergies ? ?Social History  ? ?Socioeconomic History  ? Marital status: Single  ?  Spouse name: Not on file  ? Number of children: 9  ? Years of education: 7  ? Highest education level: 7th grade  ?Occupational History  ? Occupation: unemployed  ?Tobacco Use  ? Smoking status: Some Days  ?  Packs/day: 0.10  ?  Years: 14.00  ?  Pack years: 1.40  ?  Types: Cigarettes  ? Smokeless tobacco: Never  ? Tobacco  comments:  ?  currently smokes 3 cigarettes per week  ?Vaping Use  ? Vaping Use: Never used  ?Substance and Sexual Activity  ? Alcohol use: Not Currently  ?  Comment: last use 2 weeks ago 04/2021  ? Drug use: Not Currently  ?  Types: Marijuana, Other-see comments, Cocaine  ?  Comment: last cocaine 2022  ? Sexual activity: Yes  ?  Birth control/protection: None, Injection, Pill  ?  Comment: [id number]  ?Other Topics Concern  ? Not on file  ?Social History Narrative  ? Not on file  ? ?Social Determinants of Health  ? ?Financial Resource Strain: Not on file  ?Food Insecurity: Not on file  ?Transportation Needs: Not on file  ?Physical Activity: Not on file  ?Stress: Not on file  ?Social Connections: Not on file   ?Intimate Partner Violence: Not At Risk  ? Fear of Current or Ex-Partner: No  ? Emotionally Abused: No  ? Physically Abused: No  ? Sexually Abused: No  ? ? ?Family History  ?Problem Relation Age of Onset  ? Hepatitis C Mother   ? Hepatitis C Father   ? Cancer Father   ? Asthma Daughter   ? Anesthesia problems Neg Hx   ? Alcohol abuse Neg Hx   ?  ? ?Review of Systems  ?Constitutional:  Negative for chills and fever.  ?HENT:  Negative for congestion, ear discharge, ear pain, hearing loss, sinus pain and sore throat.   ?Eyes:  Negative for blurred vision and double vision.  ?Respiratory:  Negative for cough, shortness of breath and wheezing.   ?Cardiovascular:  Negative for chest pain, palpitations and leg swelling.  ?Gastrointestinal:  Positive for abdominal pain. Negative for blood in stool, constipation, diarrhea, heartburn, melena, nausea and vomiting.  ?Genitourinary:  Negative for dysuria, flank pain, frequency, hematuria and urgency.  ?Musculoskeletal:  Negative for back pain, joint pain and myalgias.  ?Skin:  Negative for itching and rash.  ?Neurological:  Negative for dizziness, tingling, tremors, sensory change, speech change, focal weakness, seizures, loss of consciousness, weakness and headaches.  ?Endo/Heme/Allergies:  Negative for environmental allergies. Does not bruise/bleed easily.  ?Psychiatric/Behavioral:  Negative for depression, hallucinations, memory loss, substance abuse and suicidal ideas. The patient is not nervous/anxious and does not have insomnia.    ? ?Physical Exam: ?BP (!) 107/59 (BP Location: Left Arm)   Pulse 74   Temp 97.9 ?F (36.6 ?C) (Oral)   Resp 16   LMP 12/05/2020 (Within Months)   SpO2 98%   ?Constitutional: Well nourished, well developed female in no acute distress.  ?HEENT: normal ?Skin: Warm and dry.  ?Cardiovascular: Regular rate and rhythm.   ?Extremity:  no edema   ?Respiratory: Clear to auscultation bilateral. Normal respiratory effort ?Abdomen: FHT present, Fundal  Height 25 cm ?Back: no CVAT ?Neuro: DTRs 2+, Cranial nerves grossly intact ?Psych: Alert and Oriented x3. No memory deficits. Normal mood and affect.  ? ?Consults: None ? ?Significant Findings/ Diagnostic Studies: labs/imaging ?CLINICAL DATA: 36 year old pregnant female presents with abdominal ?cramping and spotting. EDC by LMP: 09/11/2021, projecting to an ?expected gestational age of [redacted] weeks 5 days. ? ?EXAM: ?LIMITED OBSTETRIC ULTRASOUND ? ?COMPARISON: No prior scans from this gestation. ? ?FINDINGS: ?Number of Fetuses: 1 ? ?Heart Rate: 144 bpm ? ?Movement: Yes ? ?Presentation: Breech ? ?Placental Location: Anterior ? ?Previa: No ? ?Amniotic Fluid (Subjective): Within normal limits. ? ?Deepest vertical pocket: 7.4 cm ? ?FL: 4.4 cm 24 w 4 d ? ?MATERNAL FINDINGS: ? ?Cervix: Appears  closed. Cervix length approximately 3.4 cm on ?transabdominal views, with no evidence of internal cervical ?funneling. ? ?Uterus/Adnexae: No abnormality visualized. ? ?IMPRESSION: ?1. Single living intrauterine gestation in breech lie at 24 weeks 4 ?days by limited fetal biometry, discordant with the expected ?gestational age of [redacted] weeks 5 days by provided menstrual dating. ?2. No acute gestational abnormality demonstrated. Normal amniotic ?fluid volume. Normal cervix. ?3. Recommend close follow-up obstetric scan (with fetal anatomic ?evaluation if not previously performed) on an outpatient basis to ?assess fetal growth. ? ?This exam is performed on an emergent basis and does not ?comprehensively evaluate fetal size, dating, or anatomy; follow-up ?complete OB US should be considered if further fetal assessment is ?warranted. ? ?Electronically Signed ?By: Ilona Sorrel M.D. ?On: 06/10/2021 11:08  ? ? Latest Reference Range & Units 06/10/21 10:27 06/10/21 10:58 06/10/21 10:59 06/10/21 11:11  ?COMPREHENSIVE METABOLIC PANEL  Rpt !     ?Sodium 135 - 145 mmol/L 138     ?Potassium 3.5 - 5.1 mmol/L 4.1     ?Chloride 98 - 111 mmol/L 109     ?CO2  22 - 32 mmol/L 19 (L)     ?Glucose 70 - 99 mg/dL 123 (H)     ?BUN 6 - 20 mg/dL 11     ?Creatinine 0.44 - 1.00 mg/dL 0.79     ?Calcium 8.9 - 10.3 mg/dL 8.5 (L)     ?Anion gap 5 - 15  10     ?Magnesium 1.7 - 2.4 m

## 2021-06-10 NOTE — ED Provider Notes (Signed)
? ?[facility] ?Provider Note ? ? ? None  ?  (approximate) ? ? ?History  ? ?Abdominal Pain ? ? ?HPI ? ?Tonya Nichols is a 36 y.o. female   [phone] who presents for evaluation of some abdominal cramping which patient states is concerning for contractions in the setting of [redacted] weeks pregnant based on last LMP was sometime at the end of June per patient.  She has not received any treatment of Benadryl last night for insomnia discomfort.  No other medications or significant past medical history per patient.  Recent fevers, cough, chest pain, shortness of breath, back pain, urinary symptoms, vaginal bleeding or discharge, rash or extremity pain.  She denies seizures denies tobacco abuse.  She states has had 1-2 migraines over the pregnancy but is not a regular.  Has not had any alcohol recently.  No other acute concerns.  No leakage of any vaginal fluid. ? ?  ? ? ?Physical Exam  ?Triage Vital Signs: ?ED Triage Vitals  ?Enc Vitals Group  ?   BP   ?   Pulse   ?   Resp   ?   Temp   ?   Temp src   ?   SpO2   ?   Weight   ?   Height   ?   Head Circumference   ?   Peak Flow   ?   Pain Score   ?   Pain Loc   ?   Pain Edu?   ?   Excl. in GC?   ? ? ?Most recent vital signs: ?Vitals:  ? 06/10/21 1130 06/10/21 1200  ?BP: 101/66 115/69  ?Pulse: 88 72  ?Resp:    ?Temp:    ?SpO2: 98% 98%  ? ? ?General: Awake, no distress.  ?CV:  Good peripheral perfusion.  2+ radial pulses. ?Resp:  Normal effort.  Bilaterally ?Abd:  No distention.  Abdomen abdomen is soft.  No CVA tenderness. ?Other:   ? ? ?ED Results / Procedures / Treatments  ?Labs ?(all labs ordered are listed, but only abnormal results are displayed) ?Labs Reviewed  ?CBC WITH DIFFERENTIAL/PLATELET - Abnormal; Notable for the following components:  ?    Result Value  ? Hemoglobin 11.9 (*)   ? Abs Immature Granulocytes 0.10 (*)   ? All other components within normal limits  ?COMPREHENSIVE METABOLIC PANEL - Abnormal; Notable for the following components:  ?  CO2 19 (*)   ? Glucose, Bld 123 (*)   ? Calcium 8.5 (*)   ? Albumin 3.0 (*)   ? All other components within normal limits  ?URINALYSIS, COMPLETE (UACMP) WITH MICROSCOPIC - Abnormal; Notable for the following components:  ? Color, Urine AMBER (*)   ? APPearance CLOUDY (*)   ? Leukocytes,Ua MODERATE (*)   ? Bacteria, UA MANY (*)   ? All other components within normal limits  ?URINE CULTURE  ?MAGNESIUM  ?HCG, QUANTITATIVE, PREGNANCY  ?HEPATITIS B SURFACE ANTIGEN  ?RUBELLA SCREEN  ?RPR  ?ABO/RH  ? ? ? ?EKG ? ? ?RADIOLOGY ? ?OB ultrasound my interpretation shows an IUP in breech position.  I also reviewed radiologist rotation and agree with the findings of gestational age by size at 24 weeks 4 days in a breech lie without any other acute abnormalities.  I agree with notation of normal amniotic fluid volume, normal cervix without evidence of hemorrhage.  Fetal heart rate is noted to be 144. ? ? ?PROCEDURES: ? ?Critical Care performed: No ? ?  Procedures ? ? ? ?MEDICATIONS ORDERED IN ED: ?Medications  ?acetaminophen (TYLENOL) tablet 1,000 mg (1,000 mg Oral Given 06/10/21 1108)  ? ? ? ?IMPRESSION / MDM / ASSESSMENT AND PLAN / ED COURSE  ?I reviewed the triage vital signs and the nursing notes. ?             ?               ? ?Differential diagnosis includes, but is not limited to preterm labor, ectopic, subchorionic hemorrhage, and cystitis. ? ?CMP shows a bicarb of 19 without any other significant electrolyte or metabolic derangements.  Magnesium is 1.8.  CBC shows WBC count of 9.7 and hemoglobin of 11.9 with normal platelets.  UA is remarkable for moderate leukocyte esterase and 11-20 WBCs with many bacteria and 21-50 squamous epithelial cells present.  Urine culture sent. ? ?OB ultrasound my interpretation shows an IUP in breech position.  I also reviewed radiologist rotation and agree with the findings of gestational age by size at 24 weeks 4 days in a breech lie without any other acute abnormalities.  I agree with notation  of normal amniotic fluid volume, normal cervix without evidence of hemorrhage.  Fetal heart rate is noted to be 144. ? ?This time we have a low suspicion for active delivery or immediate life-threatening process and I think patient stable for transfer to L&D for further evaluation.  I discussed this with Dr. Tiburcio Pea who is in agreement.  Patient will be transferred to L&D where they can follow-up her remaining prenatal labs including RPR, rubella screen, hepatitis B surface antigen, Rh status and hCG.  She will likely require treatment of her bacteria urea as well.  I updated patient on plan. ?  ? ? ?FINAL CLINICAL IMPRESSION(S) / ED DIAGNOSES  ? ?Final diagnoses:  ?Second trimester pregnancy  ? ? ? ?Rx / DC Orders  ? ?ED Discharge Orders   ? ? None  ? ?  ? ? ? ?Note:  This document was prepared using Dragon voice recognition software and may include unintentional dictation errors. ?  ?Gilles Chiquito, MD ?06/10/21 1204 ? ?

## 2021-06-11 LAB — RPR: RPR Ser Ql: NONREACTIVE

## 2021-06-11 LAB — URINE CULTURE

## 2021-06-11 LAB — RUBELLA SCREEN: Rubella: 1.2 index (ref >0.99)

## 2021-06-23 ENCOUNTER — Other Ambulatory Visit: Payer: Self-pay

## 2021-06-23 ENCOUNTER — Observation Stay
Admission: EM | Admit: 2021-06-23 | Discharge: 2021-06-23 | Disposition: A | Discharge status: Home / Self Care | Payer: Medicaid Other | Attending: Advanced Practice Midwife | Admitting: Advanced Practice Midwife

## 2021-06-23 ENCOUNTER — Encounter: Payer: Self-pay | Admitting: Obstetrics and Gynecology

## 2021-06-23 DIAGNOSIS — O212 Late vomiting of pregnancy: Secondary | ICD-10-CM | POA: Diagnosis not present

## 2021-06-23 DIAGNOSIS — F1490 Cocaine use, unspecified, uncomplicated: Secondary | ICD-10-CM | POA: Diagnosis not present

## 2021-06-23 DIAGNOSIS — O26892 Other specified pregnancy related conditions, second trimester: Principal | ICD-10-CM | POA: Insufficient documentation

## 2021-06-23 DIAGNOSIS — J45909 Unspecified asthma, uncomplicated: Secondary | ICD-10-CM | POA: Diagnosis not present

## 2021-06-23 DIAGNOSIS — O0930 Supervision of pregnancy with insufficient antenatal care, unspecified trimester: Secondary | ICD-10-CM

## 2021-06-23 DIAGNOSIS — F17210 Nicotine dependence, cigarettes, uncomplicated: Secondary | ICD-10-CM | POA: Diagnosis not present

## 2021-06-23 DIAGNOSIS — Z3A26 26 weeks gestation of pregnancy: Secondary | ICD-10-CM | POA: Insufficient documentation

## 2021-06-23 DIAGNOSIS — R197 Diarrhea, unspecified: Secondary | ICD-10-CM | POA: Insufficient documentation

## 2021-06-23 DIAGNOSIS — O99320 Drug use complicating pregnancy, unspecified trimester: Secondary | ICD-10-CM | POA: Diagnosis present

## 2021-06-23 DIAGNOSIS — O09522 Supervision of elderly multigravida, second trimester: Secondary | ICD-10-CM | POA: Diagnosis not present

## 2021-06-23 DIAGNOSIS — O99322 Drug use complicating pregnancy, second trimester: Secondary | ICD-10-CM | POA: Diagnosis not present

## 2021-06-23 DIAGNOSIS — R109 Unspecified abdominal pain: Secondary | ICD-10-CM | POA: Diagnosis not present

## 2021-06-23 DIAGNOSIS — O99332 Smoking (tobacco) complicating pregnancy, second trimester: Secondary | ICD-10-CM | POA: Insufficient documentation

## 2021-06-23 DIAGNOSIS — O0932 Supervision of pregnancy with insufficient antenatal care, second trimester: Secondary | ICD-10-CM

## 2021-06-23 DIAGNOSIS — O99512 Diseases of the respiratory system complicating pregnancy, second trimester: Secondary | ICD-10-CM | POA: Diagnosis not present

## 2021-06-23 LAB — COMPREHENSIVE METABOLIC PANEL
ALT: 12 U/L (ref 0–44)
AST: 14 U/L — ABNORMAL LOW (ref 15–41)
Albumin: 2.5 g/dL — ABNORMAL LOW (ref 3.5–5.0)
Alkaline Phosphatase: 47 U/L (ref 38–126)
Anion gap: 7 (ref 5–15)
BUN: 8 mg/dL (ref 6–20)
CO2: 20 mmol/L — ABNORMAL LOW (ref 22–32)
Calcium: 7.7 mg/dL — ABNORMAL LOW (ref 8.9–10.3)
Chloride: 109 mmol/L (ref 98–111)
Creatinine, Ser: 0.49 mg/dL (ref 0.44–1.00)
GFR, Estimated: >60 mL/min (ref >60)
Glucose, Bld: 88 mg/dL (ref 70–99)
Potassium: 3.3 mmol/L — ABNORMAL LOW (ref 3.5–5.1)
Sodium: 136 mmol/L (ref 135–145)
Total Bilirubin: 0.4 mg/dL (ref 0.3–1.2)
Total Protein: 5.6 g/dL — ABNORMAL LOW (ref 6.5–8.1)

## 2021-06-23 LAB — CBC
HCT: 32.3 % — ABNORMAL LOW (ref 36.0–46.0)
Hemoglobin: 10.8 g/dL — ABNORMAL LOW (ref 12.0–15.0)
MCH: 28.4 pg (ref 26.0–34.0)
MCHC: 33.4 g/dL (ref 30.0–36.0)
MCV: 85 fL (ref 80.0–100.0)
Platelets: 163 10*3/uL (ref 150–400)
RBC: 3.8 MIL/uL — ABNORMAL LOW (ref 3.87–5.11)
RDW: 14.2 % (ref 11.5–15.5)
WBC: 11 10*3/uL — ABNORMAL HIGH (ref 4.0–10.5)
nRBC: 0 % (ref 0.0–0.2)

## 2021-06-23 LAB — URINALYSIS, COMPLETE (UACMP) WITH MICROSCOPIC
Bacteria, UA: NONE SEEN
Bilirubin Urine: NEGATIVE
Glucose, UA: NEGATIVE mg/dL
Hgb urine dipstick: NEGATIVE
Ketones, ur: NEGATIVE mg/dL
Leukocytes,Ua: NEGATIVE
Nitrite: NEGATIVE
Protein, ur: NEGATIVE mg/dL
Specific Gravity, Urine: 1.027 (ref 1.005–1.030)
pH: 5 (ref 5.0–8.0)

## 2021-06-23 LAB — URINE DRUG SCREEN, QUALITATIVE (ARMC ONLY)
Amphetamines, Ur Screen: NOT DETECTED
Barbiturates, Ur Screen: NOT DETECTED
Benzodiazepine, Ur Scrn: NOT DETECTED
Cannabinoid 50 Ng, Ur ~~LOC~~: NOT DETECTED
Cocaine Metabolite,Ur ~~LOC~~: POSITIVE — AB
MDMA (Ecstasy)Ur Screen: NOT DETECTED
Methadone Scn, Ur: NOT DETECTED
Opiate, Ur Screen: NOT DETECTED
Phencyclidine (PCP) Ur S: NOT DETECTED
Tricyclic, Ur Screen: NOT DETECTED

## 2021-06-23 MED ORDER — LACTATED RINGERS IV BOLUS
1000.0000 mL | Freq: Once | INTRAVENOUS | Status: AC
  Administered 2021-06-23: 1000 mL via INTRAVENOUS

## 2021-06-23 MED ORDER — ONDANSETRON HCL 4 MG/2ML IJ SOLN
4.0000 mg | Freq: Four times a day (QID) | INTRAMUSCULAR | Status: DC | PRN
  Administered 2021-06-23: 4 mg via INTRAVENOUS
  Filled 2021-06-23: qty 2

## 2021-06-23 MED ORDER — ACETAMINOPHEN 500 MG PO TABS
1000.0000 mg | ORAL_TABLET | Freq: Once | ORAL | Status: AC
  Administered 2021-06-23: 1000 mg via ORAL
  Filled 2021-06-23: qty 2
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
## 2021-06-23 NOTE — Progress Notes (Addendum)
Pt reports feeling "tired" but no recent abdominal pain or nausea after medications and fluids.  ?PO fluids given to trial.  ?
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
## 2021-06-23 NOTE — OB Triage Note (Addendum)
Pt discharged home per order.  Pt stable and ambulatory and an After Visit Summary was printed and given to the patient. Discharge education completed with patient/family including follow up instructions, appointments, and medication list. Pt received labor and bleeding precautions. Patient able to verbalize understanding, all questions fully answered upon discharge.  ?Pt encouraged to establish OB care.  ?

## 2021-07-18 IMAGING — US US OB COMP +14 WK
1 series · 13 of 28 positions shown · non-contrast
Comparison: none

CLINICAL DATA: Pregnancy.  No prenatal care.

EXAM:
OBSTETRICAL ULTRASOUND >14 WKS

[Series 1: us ob comp + 14 wk · 87 acquisitions, 13 frames shown]
[im 4/87]
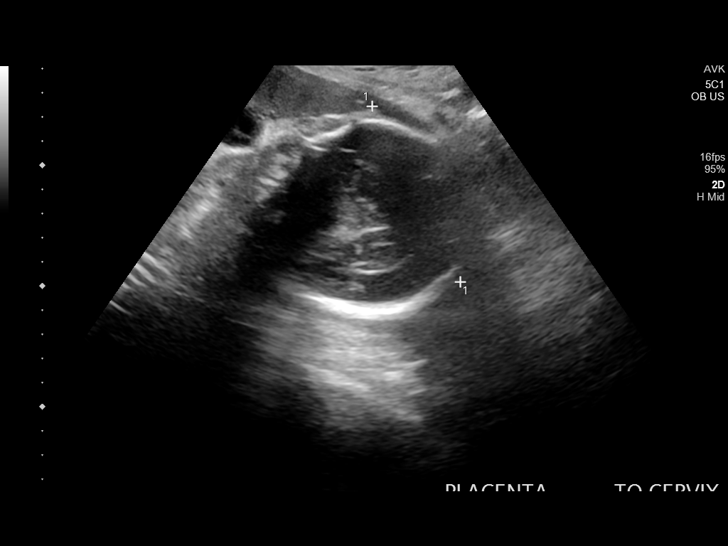
[im 10/87]
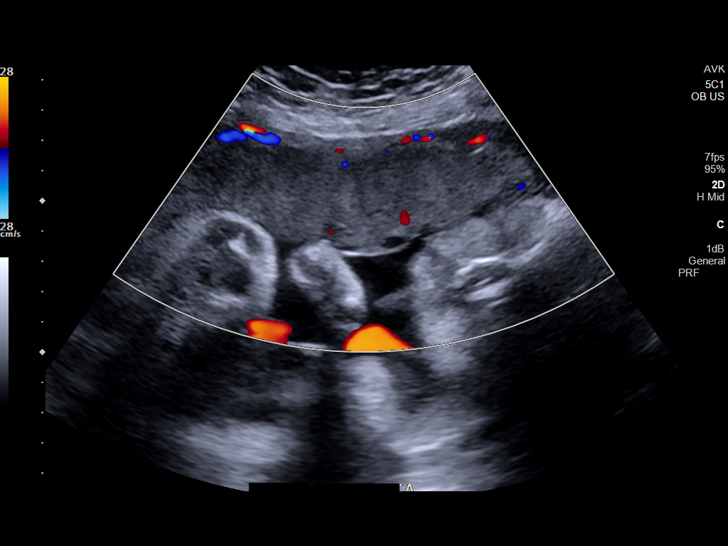
[im 16/87]
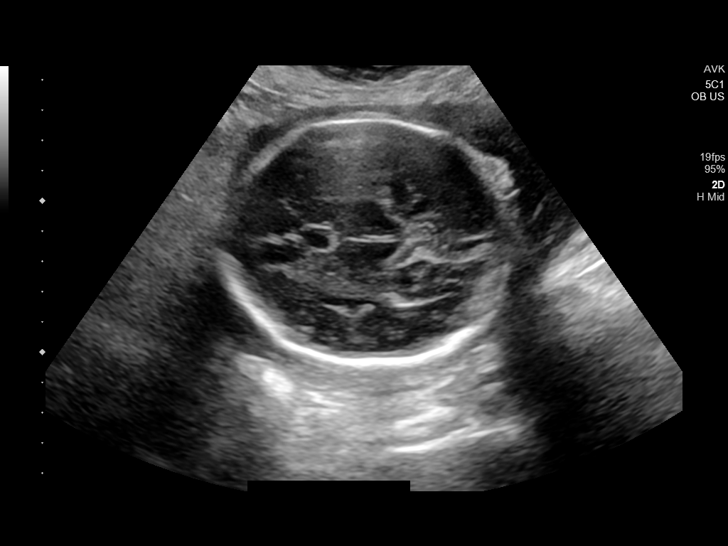
[im 23/87]
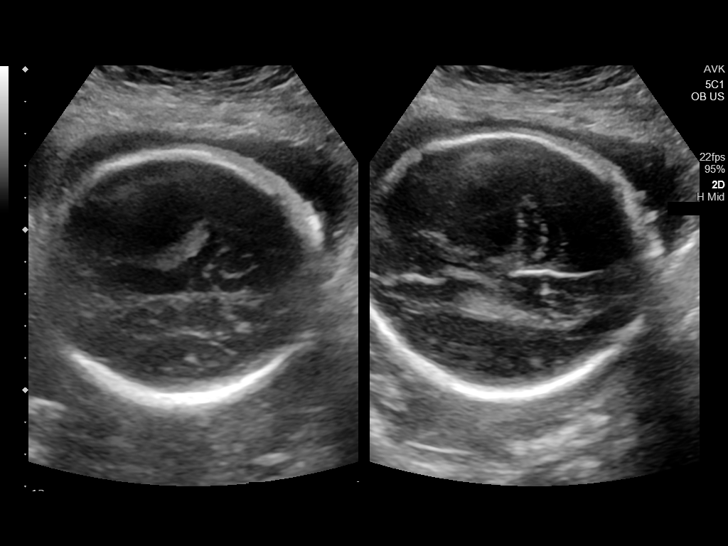
[im 29/87]
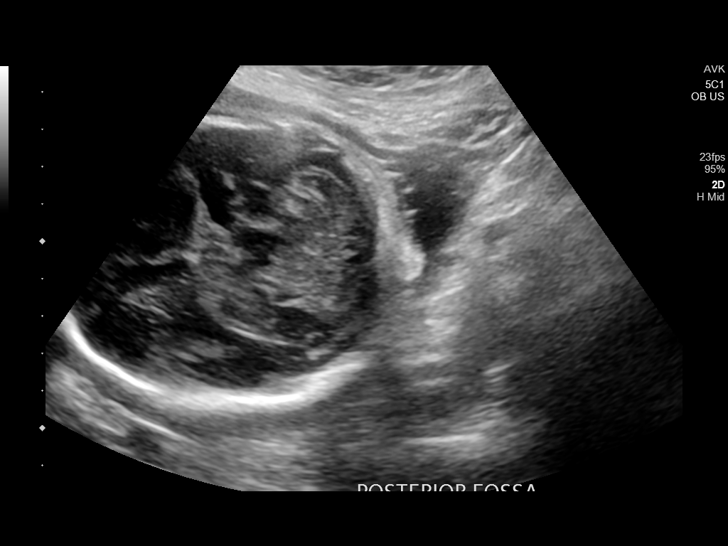
[im 36/87]
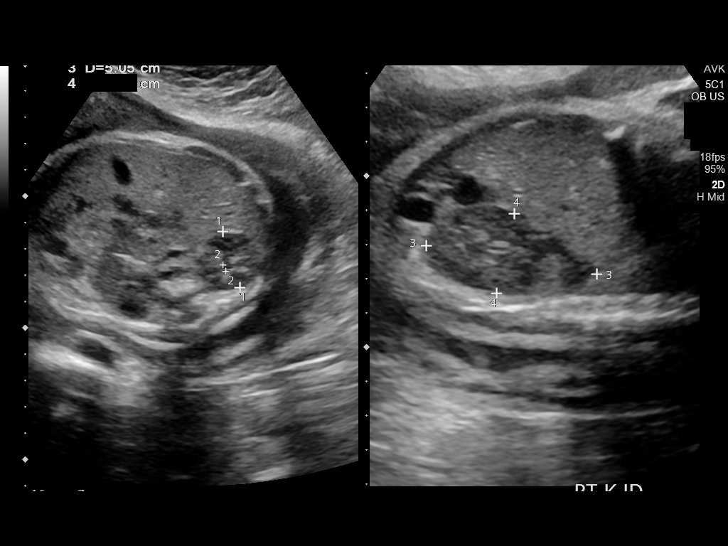
[im 45/87]
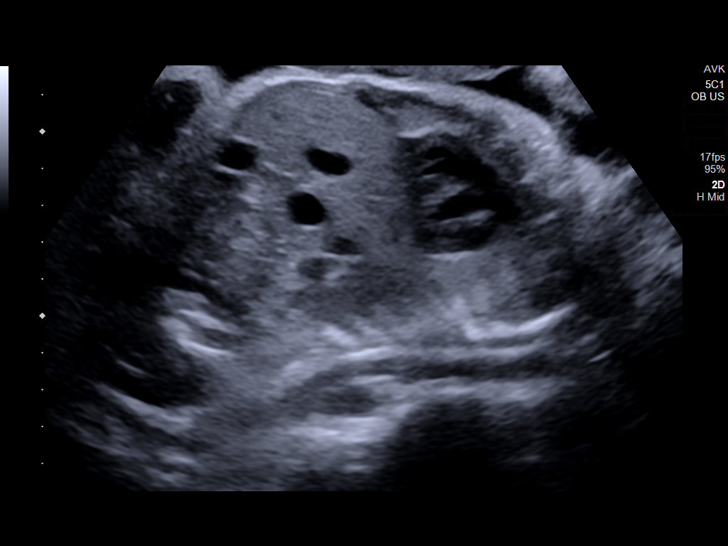
[im 51/87]
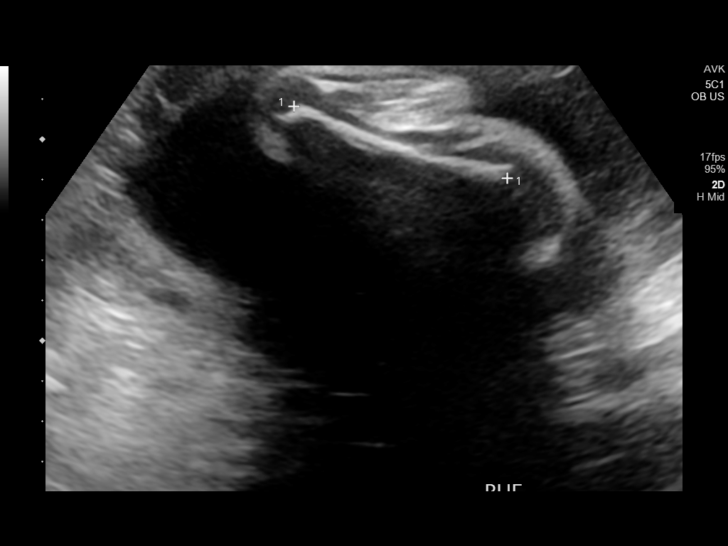
[im 58/87]
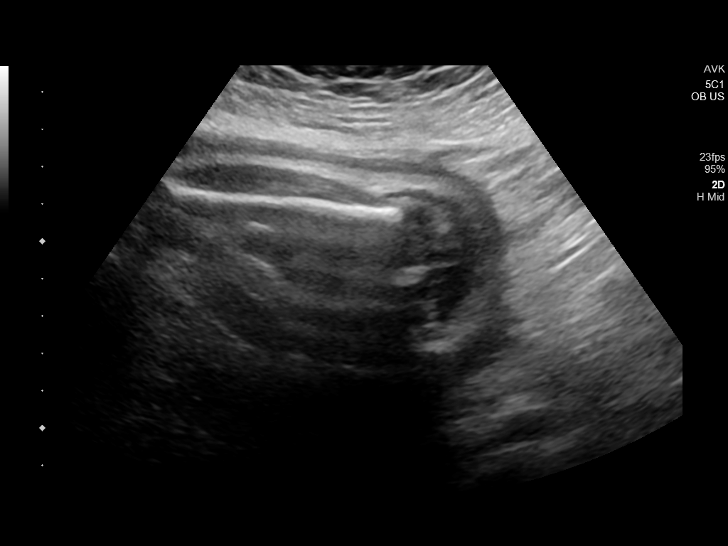
[im 64/87]
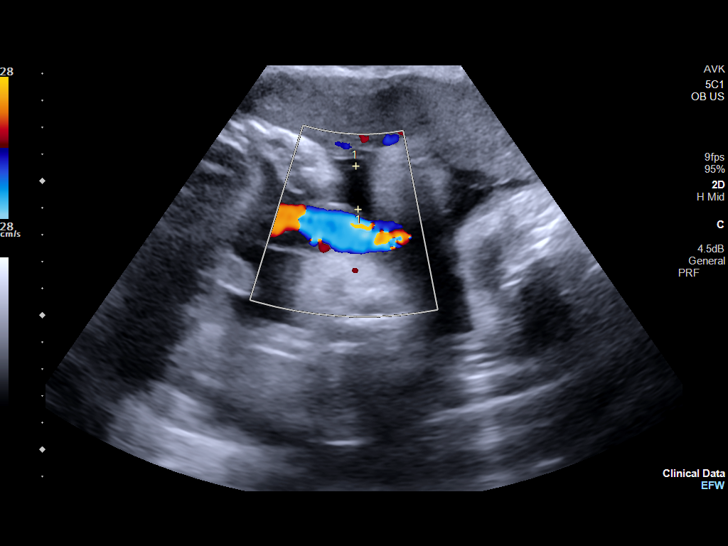
[im 71/87]
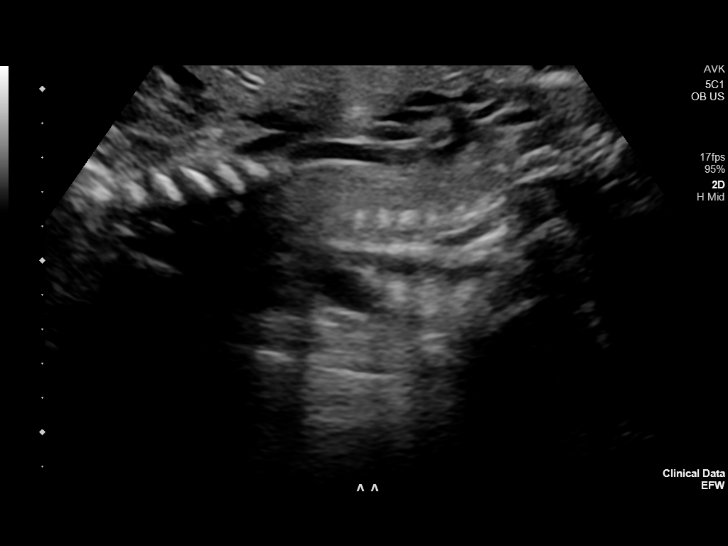
[im 77/87]
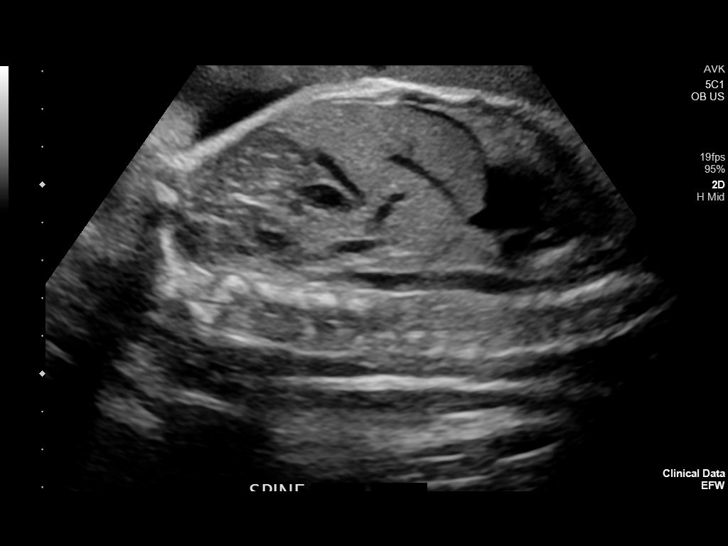
[im 83/87]
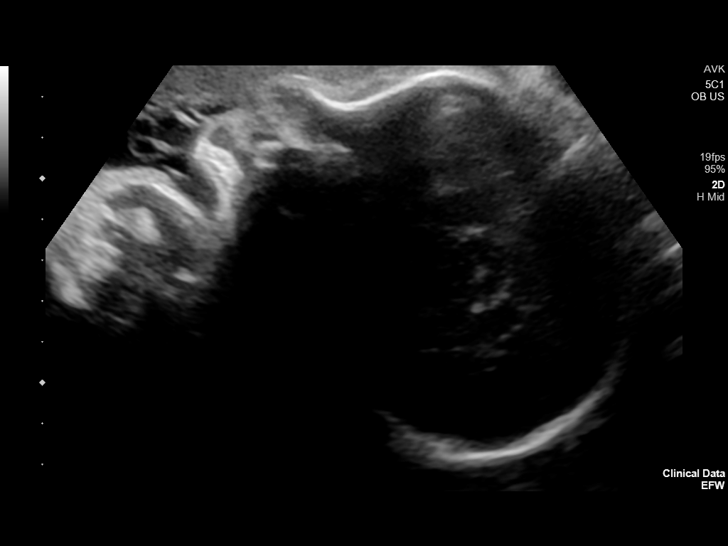

[13 of 28 positions shown; findings below may reference images not displayed]

FINDINGS: Number of Fetuses: 1

Heart Rate:  155 bpm

Movement: Yes

Presentation: Cephalic

Previa: No

Placental Location: Anterior

Amniotic Fluid (Subjective): Normal

Amniotic Fluid (Objective):

AFI = 10.7 cm (5%ile= 8.6 cm, 95%= 24.2 cm for 32 wks)

FETAL BIOMETRY

BPD: 7.9cm 31w 6d

HC:   28.8cm 31w 5d

AC:   27.5cm 31w 4d

FL:   6.5cm 33w 3d

Current Mean GA: 32w 3d US EDC: 11/18/2019

Assigned GA: Unknown.

Estimated Fetal Weight:  1,913g

FETAL ANATOMY

Lateral Ventricles: Appears normal

Thalami/CSP: Appears normal

Posterior Fossa:  Appears normal

Nuchal Region: Appears normal   NFT= N/A > 20 WKS

Upper Lip: Appears normal

Spine: Not well visualized

4 Chamber Heart on Left: Appears normal

LVOT: Appears normal

RVOT: Appears normal

Stomach on Left: Appears normal

3 Vessel Cord: Appears normal

Cord Insertion site: Appears normal

Kidneys: Appears normal

Bladder: Appears normal

Extremities: Appears normal

Sex: Male

Technically difficult due to: Advanced gestational age

Maternal Findings:

Cervix:  3.0 cm
IMPRESSION: 1. Single live intrauterine gestation in cephalic presentation
measuring 32 weeks 3 days.
2. Amniotic fluid index of 10.7 cm, within normal limits.
3. No fetal anatomic anomaly was identified. Fetal spine was
suboptimally visualized secondary to advanced gestational age.
Remainder of the anatomic survey is within normal limits.

## 2021-07-25 ENCOUNTER — Other Ambulatory Visit: Payer: Self-pay

## 2021-07-25 ENCOUNTER — Ambulatory Visit: Payer: Medicaid Other | Admitting: Nurse Practitioner

## 2021-07-25 ENCOUNTER — Encounter: Payer: Self-pay | Admitting: Nurse Practitioner

## 2021-07-25 VITALS — BP 94/64 | HR 84 | Temp 97.2°F | Wt 193.6 lb

## 2021-07-25 DIAGNOSIS — O26899 Other specified pregnancy related conditions, unspecified trimester: Secondary | ICD-10-CM | POA: Diagnosis not present

## 2021-07-25 DIAGNOSIS — O0993 Supervision of high risk pregnancy, unspecified, third trimester: Secondary | ICD-10-CM | POA: Insufficient documentation

## 2021-07-25 DIAGNOSIS — O09529 Supervision of elderly multigravida, unspecified trimester: Secondary | ICD-10-CM | POA: Insufficient documentation

## 2021-07-25 DIAGNOSIS — Z6791 Unspecified blood type, Rh negative: Secondary | ICD-10-CM | POA: Diagnosis not present

## 2021-07-25 LAB — URINALYSIS
Bilirubin, UA: NEGATIVE
Glucose, UA: NEGATIVE
Ketones, UA: NEGATIVE
Leukocytes,UA: NEGATIVE
Nitrite, UA: NEGATIVE
Protein,UA: NEGATIVE
RBC, UA: NEGATIVE
Specific Gravity, UA: 1.015 (ref 1.005–1.030)
Urobilinogen, Ur: 0.2 mg/dL (ref 0.2–1.0)
pH, UA: 7 (ref 5.0–7.5)

## 2021-07-25 LAB — HEMOGLOBIN, FINGERSTICK: Hemoglobin: 11 g/dL — ABNORMAL LOW (ref 11.1–15.9)

## 2021-07-25 MED ORDER — RHO D IMMUNE GLOBULIN 1500 UNIT/2ML IJ SOSY
300.0000 ug | PREFILLED_SYRINGE | Freq: Once | INTRAMUSCULAR | Status: AC
  Administered 2021-07-25: 300 ug via INTRAMUSCULAR

## 2021-07-25 NOTE — Progress Notes (Unsigned)
Hgb = 11.0 and urine dip WNL. No further interventions required per standing order. Jossie Ng, RN Unable to edit initial note at this time. RN documented provider given copy of signed BTL consent => incorrect. Client was given copy of signed BTL consent. Jossie Ng, RN

## 2021-07-25 NOTE — Progress Notes (Signed)
Patient was unable to stay for provider portion of the visit due to transportation.  Nursing portion complete along with labs.  Follow up provider visit scheduled for 08/04/21. Glenna Fellows, FNP

## 2021-07-25 NOTE — Progress Notes (Unsigned)
Presents for initiation of prenatal care. Client has had [facility] ED evaluations x2 this pregnancy with [place] at each visit. Desires to complete RN portion of visit today with labs and return for PE (due to transportation problems). Declined flu vaccine and UDS today. BTL consent signed with copy given to provider and copy slid under office door of Ms. Marlan Palau. Original sent for scanning. Jossie Ng, RN

## 2021-07-26 LAB — PROTEIN / CREATININE RATIO, URINE
Creatinine, Urine: 50 mg/dL
Protein, Ur: 4.6 mg/dL
Protein/Creat Ratio: 92 mg/g creat (ref 0–200)

## 2021-07-27 LAB — URINE CULTURE

## 2021-07-29 LAB — QUANTIFERON-TB GOLD PLUS
QuantiFERON Mitogen Value: >10 IU/mL
QuantiFERON Nil Value: 0.02 IU/mL
QuantiFERON TB1 Ag Value: 0.03 IU/mL
QuantiFERON TB2 Ag Value: 0.01 IU/mL
QuantiFERON-TB Gold Plus: NEGATIVE

## 2021-07-29 LAB — COMPREHENSIVE METABOLIC PANEL
ALT: 15 IU/L (ref 0–32)
AST: 19 IU/L (ref 0–40)
Albumin/Globulin Ratio: 1.4 (ref 1.2–2.2)
Albumin: 3.7 g/dL — ABNORMAL LOW (ref 3.8–4.8)
Alkaline Phosphatase: 69 IU/L (ref 44–121)
BUN/Creatinine Ratio: 15 (ref 9–23)
BUN: 8 mg/dL (ref 6–20)
Bilirubin Total: 0.2 mg/dL (ref 0.0–1.2)
CO2: 18 mmol/L — ABNORMAL LOW (ref 20–29)
Calcium: 8.5 mg/dL — ABNORMAL LOW (ref 8.7–10.2)
Chloride: 104 mmol/L (ref 96–106)
Creatinine, Ser: 0.53 mg/dL — ABNORMAL LOW (ref 0.57–1.00)
Globulin, Total: 2.7 g/dL (ref 1.5–4.5)
Glucose: 92 mg/dL (ref 70–99)
Potassium: 4.1 mmol/L (ref 3.5–5.2)
Sodium: 136 mmol/L (ref 134–144)
Total Protein: 6.4 g/dL (ref 6.0–8.5)
eGFR: 121 mL/min/{1.73_m2} (ref >59)

## 2021-07-29 LAB — CBC/D/PLT+RPR+RH+ABO+AB SCR
Antibody Screen: NEGATIVE
Basophils Absolute: 0.1 10*3/uL (ref 0.0–0.2)
Basos: 1 %
EOS (ABSOLUTE): 0.2 10*3/uL (ref 0.0–0.4)
Eos: 2 %
Hematocrit: 33.4 % — ABNORMAL LOW (ref 34.0–46.6)
Hemoglobin: 11.1 g/dL (ref 11.1–15.9)
Hepatitis B Surface Ag: NEGATIVE
Immature Grans (Abs): 0.2 10*3/uL — ABNORMAL HIGH (ref 0.0–0.1)
Immature Granulocytes: 2 %
Lymphocytes Absolute: 1.9 10*3/uL (ref 0.7–3.1)
Lymphs: 18 %
MCH: 28.5 pg (ref 26.6–33.0)
MCHC: 33.2 g/dL (ref 31.5–35.7)
MCV: 86 fL (ref 79–97)
Monocytes Absolute: 0.7 10*3/uL (ref 0.1–0.9)
Monocytes: 7 %
Neutrophils Absolute: 7.5 10*3/uL — ABNORMAL HIGH (ref 1.4–7.0)
Neutrophils: 70 %
Platelets: 208 10*3/uL (ref 150–450)
RBC: 3.89 x10E6/uL (ref 3.77–5.28)
RDW: 13.8 % (ref 11.7–15.4)
RPR Ser Ql: NONREACTIVE
Rh Factor: NEGATIVE
WBC: 10.6 10*3/uL (ref 3.4–10.8)

## 2021-07-29 LAB — HCV AB W REFLEX TO QUANT PCR: HCV Ab: NONREACTIVE

## 2021-07-29 LAB — HGB A1C W/O EAG: Hgb A1c MFr Bld: 5.2 % (ref 4.8–5.6)

## 2021-07-29 LAB — HIV-1/HIV-2 QUALITATIVE RNA
HIV-1 RNA, Qualitative: NONREACTIVE
HIV-2 RNA, Qualitative: NONREACTIVE

## 2021-07-29 LAB — GLUCOSE, 1 HOUR GESTATIONAL: Gestational Diabetes Screen: 90 mg/dL (ref 70–139)

## 2021-07-29 LAB — HCV INTERPRETATION

## 2021-07-29 LAB — TSH: TSH: 1.34 u[IU]/mL (ref 0.450–4.500)

## 2021-07-30 LAB — MATERNIT 21 PLUS CORE, BLOOD
Fetal Fraction: 9
Result (T21): NEGATIVE
Trisomy 13 (Patau syndrome): NEGATIVE
Trisomy 18 (Edwards syndrome): NEGATIVE
Trisomy 21 (Down syndrome): NEGATIVE

## 2021-08-04 ENCOUNTER — Telehealth: Payer: Self-pay

## 2021-08-04 NOTE — Telephone Encounter (Signed)
[facility] in [facility] for physical / PAP / gonorrhea and chlamydia. RN portion of new OB appt (including all other labs and Rhophylac previously completed).   Call to client and female answering phonet states she is not home. Requested he ask client to call and reschedule appt and he states will do so. Jossie Ng, RN

## 2021-08-05 ENCOUNTER — Telehealth: Payer: Self-pay | Admitting: Family Medicine

## 2021-08-05 NOTE — Telephone Encounter (Signed)
Telephone call to patient this morning to number provided.  A gentleman answered the phone and said he would be seeing her in about 40 minutes or so and he would have her call [phone] to reschedule her missed appointment.  Hart Carwin, RN

## 2021-08-05 NOTE — Telephone Encounter (Signed)
Pt is schedule for the 13th

## 2021-08-08 NOTE — Telephone Encounter (Signed)
Patient has an appointment on 08-16-2021 at 2:20 pm.  Hart Carwin, RN

## 2021-08-11 NOTE — Addendum Note (Signed)
Addended by: Heywood Bene on: 08/11/2021 03:36 PM   Modules accepted: Orders

## 2021-08-16 ENCOUNTER — Encounter: Payer: Self-pay | Admitting: Nurse Practitioner

## 2021-08-16 ENCOUNTER — Telehealth: Payer: Self-pay

## 2021-08-16 NOTE — Progress Notes (Deleted)
Addendum done to complete OB Episode. Rich Number, RN

## 2021-08-16 NOTE — Telephone Encounter (Signed)
[facility] 08/16/2021 for provider portion of new OB appt (RN portion / labs / Rhophylac previously completed).  Call to client and per recorded message, no voicemail set up. Call to emergency contact (mother) and no answer / no voicemail. Note on pink sticky to request set up of voicemail.  When appt scheduled, needs a 40 minute appt slot. Appt does not need to be in new OB slot as long as its a 40 minute appt. IF scheduled into a new OB appt, client only needs to arrive at 9 am or 2 pm). Jossie Ng, RN

## 2021-08-19 NOTE — Telephone Encounter (Signed)
Telephone call to patient to reschedule her provider portion of the OB appointment.  Voicemail not setup on the mobile number and Restricted or Unavailable on the home number.  Hart Carwin, RN

## 2021-08-23 NOTE — Telephone Encounter (Signed)
Telephone call to patient today to schedule her OB appointment.  Voicemail not setup.  Hart Carwin, RN

## 2021-08-25 NOTE — Telephone Encounter (Signed)
Telephone call to patient today to schedule her OB appointment.  Voicemail not setup.  Adrienne Trombetta, RN  

## 2021-08-26 NOTE — Telephone Encounter (Signed)
Return call from patient, but no message left.  Unable to answer call due to talking with another patient on the other line at the time.  Telephone call to patient regarding her OB appointment.  Voicemail not set up.  Hart Carwin, RN

## 2021-08-30 ENCOUNTER — Telehealth: Payer: Self-pay | Admitting: Family Medicine

## 2021-08-31 NOTE — Telephone Encounter (Signed)
Closed phone note due to it being a duplicate topic. Hart Carwin, RN

## 2021-08-31 NOTE — Telephone Encounter (Signed)
Telephone call to patient contact (Mother) today for assistance reaching patient and no answer at phone number on file. Hart Carwin, RN

## 2021-09-01 NOTE — Telephone Encounter (Signed)
Telephone call to patient this morning regarding the need to setng the need to schedule her New OB appointment.  Voicemail not set up. Hart Carwin, RN

## 2021-09-02 NOTE — Telephone Encounter (Signed)
Telephone call to patient today regarding the need to schedule her new OB appointment.  Voicemail has not been set up.  Hart Carwin, RN

## 2021-09-05 NOTE — Telephone Encounter (Signed)
Call to client to schedule appt for physical portion of new OB appt (40 minutes). Per client, needs appt on Monday. Appt scheduled for 09/12/2021 with arrival time of 1300. Jossie Ng, RN

## 2021-09-14 ENCOUNTER — Other Ambulatory Visit: Payer: Self-pay

## 2021-09-14 ENCOUNTER — Inpatient Hospital Stay
Admission: EM | Admit: 2021-09-14 | Discharge: 2021-09-16 | DRG: 806 | Disposition: A | Discharge status: Home / Self Care | Payer: Medicaid Other | Attending: Obstetrics | Admitting: Obstetrics

## 2021-09-14 ENCOUNTER — Encounter: Payer: Self-pay | Admitting: Obstetrics and Gynecology

## 2021-09-14 DIAGNOSIS — Z23 Encounter for immunization: Secondary | ICD-10-CM

## 2021-09-14 DIAGNOSIS — O479 False labor, unspecified: Secondary | ICD-10-CM | POA: Diagnosis present

## 2021-09-14 DIAGNOSIS — O99324 Drug use complicating childbirth: Secondary | ICD-10-CM | POA: Diagnosis present

## 2021-09-14 DIAGNOSIS — O99334 Smoking (tobacco) complicating childbirth: Secondary | ICD-10-CM | POA: Diagnosis present

## 2021-09-14 DIAGNOSIS — F1490 Cocaine use, unspecified, uncomplicated: Secondary | ICD-10-CM | POA: Diagnosis present

## 2021-09-14 DIAGNOSIS — O99320 Drug use complicating pregnancy, unspecified trimester: Secondary | ICD-10-CM | POA: Diagnosis present

## 2021-09-14 DIAGNOSIS — F17210 Nicotine dependence, cigarettes, uncomplicated: Secondary | ICD-10-CM | POA: Diagnosis present

## 2021-09-14 DIAGNOSIS — O26893 Other specified pregnancy related conditions, third trimester: Secondary | ICD-10-CM | POA: Diagnosis present

## 2021-09-14 DIAGNOSIS — Z3A38 38 weeks gestation of pregnancy: Secondary | ICD-10-CM

## 2021-09-14 DIAGNOSIS — F1210 Cannabis abuse, uncomplicated: Secondary | ICD-10-CM | POA: Diagnosis present

## 2021-09-14 DIAGNOSIS — Z6791 Unspecified blood type, Rh negative: Secondary | ICD-10-CM

## 2021-09-15 ENCOUNTER — Encounter: Payer: Self-pay | Admitting: Obstetrics and Gynecology

## 2021-09-15 DIAGNOSIS — O99324 Drug use complicating childbirth: Secondary | ICD-10-CM

## 2021-09-15 DIAGNOSIS — O99334 Smoking (tobacco) complicating childbirth: Secondary | ICD-10-CM | POA: Diagnosis present

## 2021-09-15 DIAGNOSIS — O09513 Supervision of elderly primigravida, third trimester: Secondary | ICD-10-CM | POA: Diagnosis not present

## 2021-09-15 DIAGNOSIS — F17210 Nicotine dependence, cigarettes, uncomplicated: Secondary | ICD-10-CM | POA: Diagnosis present

## 2021-09-15 DIAGNOSIS — F1490 Cocaine use, unspecified, uncomplicated: Secondary | ICD-10-CM | POA: Diagnosis present

## 2021-09-15 DIAGNOSIS — O26893 Other specified pregnancy related conditions, third trimester: Secondary | ICD-10-CM | POA: Diagnosis present

## 2021-09-15 DIAGNOSIS — O0933 Supervision of pregnancy with insufficient antenatal care, third trimester: Secondary | ICD-10-CM | POA: Diagnosis not present

## 2021-09-15 DIAGNOSIS — O360130 Maternal care for anti-D [Rh] antibodies, third trimester, not applicable or unspecified: Secondary | ICD-10-CM

## 2021-09-15 DIAGNOSIS — Z3A38 38 weeks gestation of pregnancy: Secondary | ICD-10-CM | POA: Diagnosis not present

## 2021-09-15 DIAGNOSIS — Z23 Encounter for immunization: Secondary | ICD-10-CM | POA: Diagnosis not present

## 2021-09-15 DIAGNOSIS — F1210 Cannabis abuse, uncomplicated: Secondary | ICD-10-CM | POA: Diagnosis not present

## 2021-09-15 DIAGNOSIS — Z6791 Unspecified blood type, Rh negative: Secondary | ICD-10-CM | POA: Diagnosis not present

## 2021-09-15 LAB — CBC
HCT: 29.9 % — ABNORMAL LOW (ref 36.0–46.0)
HCT: 31.7 % — ABNORMAL LOW (ref 36.0–46.0)
Hemoglobin: 10.4 g/dL — ABNORMAL LOW (ref 12.0–15.0)
Hemoglobin: 9.9 g/dL — ABNORMAL LOW (ref 12.0–15.0)
MCH: 27.4 pg (ref 26.0–34.0)
MCH: 27.9 pg (ref 26.0–34.0)
MCHC: 32.8 g/dL (ref 30.0–36.0)
MCHC: 33.1 g/dL (ref 30.0–36.0)
MCV: 83.6 fL (ref 80.0–100.0)
MCV: 84.2 fL (ref 80.0–100.0)
Platelets: 144 10*3/uL — ABNORMAL LOW (ref 150–400)
Platelets: 145 10*3/uL — ABNORMAL LOW (ref 150–400)
RBC: 3.55 MIL/uL — ABNORMAL LOW (ref 3.87–5.11)
RBC: 3.79 MIL/uL — ABNORMAL LOW (ref 3.87–5.11)
RDW: 15 % (ref 11.5–15.5)
RDW: 15.1 % (ref 11.5–15.5)
WBC: 11.7 10*3/uL — ABNORMAL HIGH (ref 4.0–10.5)
WBC: 8.8 10*3/uL (ref 4.0–10.5)
nRBC: 0 % (ref 0.0–0.2)
nRBC: 0 % (ref 0.0–0.2)

## 2021-09-15 LAB — RPR: RPR Ser Ql: NONREACTIVE

## 2021-09-15 LAB — CHLAMYDIA/NGC RT PCR (ARMC ONLY)
Chlamydia Tr: NOT DETECTED
N gonorrhoeae: NOT DETECTED

## 2021-09-15 LAB — WET PREP, GENITAL
Clue Cells Wet Prep HPF POC: NONE SEEN
Sperm: NONE SEEN
Trich, Wet Prep: NONE SEEN
WBC, Wet Prep HPF POC: <10 (ref <10)
Yeast Wet Prep HPF POC: NONE SEEN

## 2021-09-15 LAB — URINE DRUG SCREEN, QUALITATIVE (ARMC ONLY)
Amphetamines, Ur Screen: NOT DETECTED
Barbiturates, Ur Screen: NOT DETECTED
Benzodiazepine, Ur Scrn: NOT DETECTED
Cannabinoid 50 Ng, Ur ~~LOC~~: NOT DETECTED
Cocaine Metabolite,Ur ~~LOC~~: POSITIVE — AB
MDMA (Ecstasy)Ur Screen: NOT DETECTED
Methadone Scn, Ur: NOT DETECTED
Opiate, Ur Screen: NOT DETECTED
Phencyclidine (PCP) Ur S: NOT DETECTED
Tricyclic, Ur Screen: NOT DETECTED

## 2021-09-15 LAB — GROUP B STREP BY PCR: Group B strep by PCR: NEGATIVE

## 2021-09-15 MED ORDER — BENZOCAINE-MENTHOL 20-0.5 % EX AERO
1.0000 | INHALATION_SPRAY | CUTANEOUS | Status: DC | PRN
  Administered 2021-09-15: 1 via TOPICAL
  Filled 2021-09-15: qty 56

## 2021-09-15 MED ORDER — OXYCODONE HCL 5 MG PO TABS
10.0000 mg | ORAL_TABLET | ORAL | Status: DC | PRN
  Administered 2021-09-15 – 2021-09-16 (×9): 10 mg via ORAL
  Filled 2021-09-15 (×9): qty 2

## 2021-09-15 MED ORDER — PRENATAL MULTIVITAMIN CH
1.0000 | ORAL_TABLET | Freq: Every day | ORAL | Status: DC
  Administered 2021-09-15 – 2021-09-16 (×2): 1 via ORAL
  Filled 2021-09-15 (×2): qty 1

## 2021-09-15 MED ORDER — SIMETHICONE 80 MG PO CHEW: 80.0000 mg | CHEWABLE_TABLET | ORAL | Status: DC | PRN

## 2021-09-15 MED ORDER — ACETAMINOPHEN 500 MG PO TABS
ORAL_TABLET | ORAL | Status: AC
  Filled 2021-09-15: qty 2

## 2021-09-15 MED ORDER — WITCH HAZEL-GLYCERIN EX PADS
1.0000 | MEDICATED_PAD | CUTANEOUS | Status: DC | PRN
  Administered 2021-09-15: 1 via TOPICAL
  Filled 2021-09-15: qty 100

## 2021-09-15 MED ORDER — ACETAMINOPHEN 500 MG PO TABS
1000.0000 mg | ORAL_TABLET | Freq: Four times a day (QID) | ORAL | Status: DC | PRN
  Administered 2021-09-15: 1000 mg via ORAL

## 2021-09-15 MED ORDER — ONDANSETRON HCL 4 MG/2ML IJ SOLN: 4.0000 mg | INTRAMUSCULAR | Status: DC | PRN

## 2021-09-15 MED ORDER — OXYTOCIN 10 UNIT/ML IJ SOLN
10.0000 [IU] | Freq: Once | INTRAMUSCULAR | Status: AC
Start: 2021-09-15 — End: 2021-09-15
  Administered 2021-09-15: 10 [IU] via INTRAMUSCULAR

## 2021-09-15 MED ORDER — COCONUT OIL OIL: 1.0000 | TOPICAL_OIL | Status: DC | PRN

## 2021-09-15 MED ORDER — METHYLERGONOVINE MALEATE 0.2 MG/ML IJ SOLN
0.2000 mg | Freq: Once | INTRAMUSCULAR | Status: AC
Start: 2021-09-15 — End: 2021-09-15
  Administered 2021-09-15: 0.2 mg via INTRAMUSCULAR

## 2021-09-15 MED ORDER — TETANUS-DIPHTH-ACELL PERTUSSIS 5-2.5-18.5 LF-MCG/0.5 IM SUSY
0.5000 mL | PREFILLED_SYRINGE | Freq: Once | INTRAMUSCULAR | Status: AC
  Administered 2021-09-16: 0.5 mL via INTRAMUSCULAR
  Filled 2021-09-15 (×2): qty 0.5

## 2021-09-15 MED ORDER — IBUPROFEN 600 MG PO TABS
600.0000 mg | ORAL_TABLET | Freq: Four times a day (QID) | ORAL | Status: DC
  Administered 2021-09-15 – 2021-09-16 (×6): 600 mg via ORAL
  Filled 2021-09-15 (×6): qty 1

## 2021-09-15 MED ORDER — SENNOSIDES-DOCUSATE SODIUM 8.6-50 MG PO TABS
2.0000 | ORAL_TABLET | Freq: Every day | ORAL | Status: DC
  Administered 2021-09-16: 2 via ORAL
  Filled 2021-09-15: qty 2

## 2021-09-15 MED ORDER — CALCIUM CARBONATE ANTACID 500 MG PO CHEW
1.0000 | CHEWABLE_TABLET | ORAL | Status: DC | PRN
Start: 2021-09-15 — End: 2021-09-16
  Filled 2021-09-15: qty 1

## 2021-09-15 MED ORDER — ONDANSETRON HCL 4 MG PO TABS
4.0000 mg | ORAL_TABLET | ORAL | Status: DC | PRN
  Administered 2021-09-15: 4 mg via ORAL
  Filled 2021-09-15: qty 1

## 2021-09-15 MED ORDER — DIBUCAINE (PERIANAL) 1 % EX OINT
1.0000 | TOPICAL_OINTMENT | CUTANEOUS | Status: DC | PRN
  Administered 2021-09-15: 1 via RECTAL
  Filled 2021-09-15: qty 28

## 2021-09-15 MED ORDER — OXYCODONE HCL 5 MG PO TABS: 5.0000 mg | ORAL_TABLET | ORAL | Status: DC | PRN

## 2021-09-15 MED ORDER — DIPHENHYDRAMINE HCL 25 MG PO CAPS
25.0000 mg | ORAL_CAPSULE | Freq: Four times a day (QID) | ORAL | Status: DC | PRN
  Administered 2021-09-15: 25 mg via ORAL
  Filled 2021-09-15: qty 1

## 2021-09-15 NOTE — Discharge Summary (Signed)
OB Discharge Summary     Patient Name: Tonya Nichols DOB: [birth year] MRN: [id number]  Date of admission: 09/14/2021 Delivering provider: Graciela Husbands RN present at delivery of baby, Tresea Mall, CNM present for delivery of placenta Date of Delivery: 09/16/2021  Date of discharge: 09/16/2021  Admitting diagnosis: Uterine contractions [O47.9] Labor and delivery, indication for care [O75.9] Intrauterine pregnancy: [redacted]w[redacted]d     Secondary diagnosis:  AMA, late/limited care, grand multip, Rh negative, cocaine+     Discharge diagnosis: Term Pregnancy Delivered                                                                                                Post partum procedures:rhogam  Augmentation: N/A  Complications: None  Hospital course:  Onset of Labor With Vaginal Delivery      36 y.o. yo [id number] at [redacted]w[redacted]d was admitted in Active Labor on 09/14/2021. Patient had an uncomplicated labor course as follows:  Membrane Rupture Time/Date: 2:14 AM ,09/15/2021   Delivery Method:Vaginal, Spontaneous  Episiotomy: None  Lacerations:  None  See delivery note for details  Patient had an uncomplicated postpartum course.  She is tolerating regular diet, her pain is controlled with PO medication, she is ambulating and voiding without difficulty. She has signed consents for a tubal which will be performed at 6 weeks postpartum, and has an appointment to meet with Dr. Logan Bores on august 3rd, 2023 to plan for the BTL.  Patient is discharged home in stable condition on 09/16/21.  Newborn Data: Birth date:09/15/2021  Birth time:2:14 AM  Gender: female Living status:Living  Apgars:8 ,9  Weight:2850 g , 6 pounds 5 ounces  Physical exam  Vitals:   09/15/21 1539 09/15/21 2309 09/16/21 0802 09/16/21 1006  BP: 119/68 (!) 96/54 (!) 99/56 107/60  Pulse: 80 (!) 57 60   Resp: 20 20 18    Temp: 98.6 F (37 C) 97.7 F (36.5 C) 97.6 F (36.4 C)   TempSrc: Oral Oral Oral   SpO2: 98% 97% 98%   Weight:       Height:       General: cooperative and no distress Lochia: appropriate Uterine Fundus: firm Incision: N/A DVT Evaluation: No evidence of DVT seen on physical exam. Negative Homan's sign.  Labs: Lab Results  Component Value Date   WBC 11.7 (H) 09/15/2021   HGB 9.9 (L) 09/15/2021   HCT 29.9 (L) 09/15/2021   MCV 84.2 09/15/2021   PLT 145 (L) 09/15/2021    Discharge instruction: per After Visit Summary.  Medications:  Allergies as of 09/16/2021   No Known Allergies      Medication List     TAKE these medications    diphenhydrAMINE 25 MG tablet Commonly known as: BENADRYL Take 25 mg by mouth every 6 (six) hours as needed.   ibuprofen 600 MG tablet Commonly known as: ADVIL Take 1 tablet (600 mg total) by mouth every 6 (six) hours.   multivitamin-prenatal 27-0.8 MG Tabs tablet Take 1 tablet by mouth daily at 12 noon.        Diet: routine diet  Activity: Advance as tolerated. Pelvic rest  for 6 weeks.   Outpatient follow up:  Follow-up Information     [facility] DEPT. Schedule an appointment as soon as possible for a visit in 2 week(s).   Why: postpartum follow up visit Contact information: [address] [place] [address] [phone]                  Postpartum contraception: Tubal Ligation interval Rhogam Given postpartum: yes Rubella vaccine given postpartum: immune Varicella vaccine given postpartum: immune TDaP given antepartum or postpartum: post delivery    Newborn Delivery   Birth date/time: 09/15/2021 02:14:00 Delivery type: Vaginal, Spontaneous       Baby Feeding: Bottle  Disposition:home with mother  SIGNED:  Mirna Mires, CNM 09/16/2021 11:31 AM

## 2021-09-15 NOTE — H&P (Signed)
OB History & Physical   History of Present Illness:  Chief Complaint: contractions  HPI:  Tonya Nichols is a 36 y.o. [id number] female at [redacted]w[redacted]d dated by 24 week ultrasound.  Her pregnancy has been complicated by late and limited care, AMA, grand multip, cocaine use .    She reports contractions that began earlier in the evening.   She denies leakage of fluid on arrival to triage.   She denies vaginal bleeding.   She reports fetal movement.    Total weight gain for pregnancy: Not found.   Obstetrical Problem List: Tonya Nichols, Tonya Nichols Problems (from 07/25/21 to present)     No problems associated with this episode.        Maternal Medical History:   Past Medical History:  Diagnosis Date   Alcohol abuse    H/O cocaine abuse   Anemia    First pregnancy   Asthma    as child   Chlamydia    Cocaine use complicating pregnancy    Depression    H/O post partum depression-2016-took Zoloft x 1/ 1/2 mth   Gonorrhea    Grand multipara    History of HPV infection    History of UTI    2016   Insufficient prenatal care    Marijuana abuse    Post partum depression 07/20/2011   UTI (lower urinary tract infection)    Vaginal Pap smear with ASC-US     Past Surgical History:  Procedure Laterality Date   denies surgical history     NO PAST SURGERIES      No Known Allergies  Prior to Admission medications   Medication Sig Start Date End Date Taking? Authorizing Provider  diphenhydrAMINE (BENADRYL) 25 MG tablet Take 25 mg by mouth every 6 (six) hours as needed.    [provider]  Prenatal Vit-Fe Fumarate-FA (MULTIVITAMIN-PRENATAL) 27-0.8 MG TABS tablet Take 1 tablet by mouth daily at 12 noon.    [provider]    OB History  Gravida Para Term Preterm AB Living  13 10 10  0 1 10  SAB IAB Ectopic Multiple Live Births  1 0 0 0 10    # Outcome Date GA Lbr Len/2nd Weight Sex Delivery Anes PTL Lv  13 Current           12 SAB 05/2020          11 Term 11/11/19 [redacted]w[redacted]d  18:15 / 00:56 2900 g M Vag-Spont EPI  LIV  10 Term 04/10/18 [redacted]w[redacted]d / 00:05 2960 g M Vag-Spont None  LIV     Complications: Cocaine abuse  9 Term 10/02/14 [redacted]w[redacted]d  2920 g F Vag-Spont None  LIV     Complications: Cocaine abuse  8 Term 04/06/13 [redacted]w[redacted]d  3033 g F Vag-Spont   LIV  7 Term 05/31/11 [redacted]w[redacted]d 12:01 / 01:56 3011 g M Vag-Spont EPI  LIV  6 Term 07/30/06 [redacted]w[redacted]d  4111 g F Vag-Spont  N LIV  5 Term 11/14/02 [redacted]w[redacted]d  3260 g F Vag-Spont  N LIV  4 Term 02/12/01 [redacted]w[redacted]d  2722 g F Vag-Spont  N LIV  3 Term 03/20/00 [redacted]w[redacted]d  3402 g F Vag-Spont  N LIV  2 Term 07/16/97 [redacted]w[redacted]d  3487 g F Vag-Spont  N LIV  1 Gravida             Prenatal care site: none  Social History: She  reports that she has been smoking cigarettes. She has a 1.40 pack-year smoking history. She has been  exposed to tobacco smoke. She has never used smokeless tobacco. She reports current alcohol use. She reports current drug use. Drugs: Marijuana and Cocaine.  Family History: family history includes Arthritis in her mother; Asthma in her daughter; COPD in her mother; Cancer in her father; Dementia in her maternal grandmother; Healthy in her brother, daughter, daughter, daughter, daughter, daughter, daughter, son, and son; Hearing loss in her son; Heart attack in her maternal grandfather; Hepatitis C in her father and mother.    Review of Systems:  Review of Systems  Constitutional:  Negative for chills and fever.  HENT:  Negative for congestion, ear discharge, ear pain, hearing loss, sinus pain and sore throat.   Eyes:  Negative for blurred vision and double vision.  Respiratory:  Negative for cough, shortness of breath and wheezing.   Cardiovascular:  Negative for chest pain, palpitations and leg swelling.  Gastrointestinal:  Positive for abdominal pain. Negative for blood in stool, constipation, diarrhea, heartburn, melena, nausea and vomiting.  Genitourinary:  Negative for dysuria, flank pain, frequency, hematuria and urgency.   Musculoskeletal:  Positive for back pain. Negative for joint pain and myalgias.  Skin:  Negative for itching and rash.  Neurological:  Negative for dizziness, tingling, tremors, sensory change, speech change, focal weakness, seizures, loss of consciousness, weakness and headaches.  Endo/Heme/Allergies:  Negative for environmental allergies. Does not bruise/bleed easily.  Psychiatric/Behavioral:  Negative for depression, hallucinations, memory loss, substance abuse and suicidal ideas. The patient is not nervous/anxious and does not have insomnia.      Physical Exam:  BP (!) 115/47 (BP Location: Right Arm)   Pulse 71   Temp 97.8 F (36.6 C) (Oral)   Resp 14   LMP 12/05/2020 (Within Months)   Constitutional: Well nourished, well developed female in no acute distress.  HEENT: normal Skin: Warm and dry.  Cardiovascular: Regular rate and rhythm.   Extremity:  no edema   Respiratory: Clear to auscultation bilateral. Normal respiratory effort Abdomen: FHT present Back: no CVAT Neuro: DTRs 2+, Cranial nerves grossly intact Psych: Alert and Oriented x3. No memory deficits. Normal mood and affect.  MS: normal gait, normal bilateral lower extremity ROM/strength/stability.  Pelvic exam: per RN K. Greeson 2-3 cm on arrival to triage at 11 PM and the same 1.5 hours later. Viable female born with RN at bedside at 2:14 AM after SROM approximately 2:10 AM.   Pertinent Results:   Blood type/Rh A negative  Antibody screen Passively acquired Anti-D  Rubella Immune  Varicella Immune    RPR Non reactive  HBsAg negative  HIV negative  GC negative  Chlamydia negative  Genetic screening Not done  1 hour GTT Not done  3 hour GTT Not done  GBS negative on 09/14/21   Baseline FHR: 135 beats/min   Variability: moderate   Accelerations: present   Decelerations: absent Contractions: present frequency: every 3-4 minutes Overall assessment: reassuring   Lab Results  Component Value Date    SARSCOV2NAA POSITIVE (A) 11/11/2019    Assessment:  Tonya Nichols is a 36 y.o. [id number] female at 107w3d with contractions, precipitous delivery.   Plan:  Admit to Labor & Delivery  CBC, T&S, Clrs, IVF GBS negative.   Fetal well-being: Category I Admission initially for labor evaluation with delivery of viable female    Tresea Mall, CNM 09/15/2021 2:50 AM

## 2021-09-15 NOTE — OB Triage Note (Signed)
Tonya Nichols 36 y.o. presents to Labor & Delivery triage via EMS reporting contractions every 5 minutes that started at approximately 2045. She is a [id number] at 110w3d . She denies signs and symptoms consistent with rupture of membranes and reports bloody show. She reports positive fetal movement. External FM and TOCO applied to non-tender abdomen. Initial FHR 135. Vital signs obtained and within normal limits. Patient oriented to care environment including call bell and bed control use. Rubye Oaks, CNM notified of patient's arrival. Plan for labor eval.

## 2021-09-16 DIAGNOSIS — Z3A38 38 weeks gestation of pregnancy: Secondary | ICD-10-CM

## 2021-09-16 DIAGNOSIS — O99324 Drug use complicating childbirth: Secondary | ICD-10-CM

## 2021-09-16 DIAGNOSIS — F1210 Cannabis abuse, uncomplicated: Secondary | ICD-10-CM

## 2021-09-16 DIAGNOSIS — O0933 Supervision of pregnancy with insufficient antenatal care, third trimester: Secondary | ICD-10-CM

## 2021-09-16 DIAGNOSIS — O360130 Maternal care for anti-D [Rh] antibodies, third trimester, not applicable or unspecified: Secondary | ICD-10-CM

## 2021-09-16 DIAGNOSIS — O09513 Supervision of elderly primigravida, third trimester: Secondary | ICD-10-CM

## 2021-09-16 LAB — FETAL SCREEN: Fetal Screen: NEGATIVE

## 2021-09-16 MED ORDER — RHO D IMMUNE GLOBULIN 1500 UNIT/2ML IJ SOSY
300.0000 ug | PREFILLED_SYRINGE | Freq: Once | INTRAMUSCULAR | Status: AC
  Administered 2021-09-16: 300 ug via INTRAMUSCULAR
  Filled 2021-09-16: qty 2

## 2021-09-16 MED ORDER — MEDROXYPROGESTERONE ACETATE 150 MG/ML IM SUSP
150.0000 mg | INTRAMUSCULAR | Status: DC
  Administered 2021-09-16: 150 mg via INTRAMUSCULAR
  Filled 2021-09-16 (×2): qty 1

## 2021-09-16 MED ORDER — FERROUS SULFATE 325 (65 FE) MG PO TABS
325.0000 mg | ORAL_TABLET | Freq: Three times a day (TID) | ORAL | Status: DC
  Administered 2021-09-16: 325 mg via ORAL
  Filled 2021-09-16: qty 1

## 2021-09-16 MED ORDER — IBUPROFEN 600 MG PO TABS: 600.0000 mg | ORAL_TABLET | Freq: Four times a day (QID) | ORAL | 0 refills | Status: DC

## 2021-09-16 NOTE — Progress Notes (Signed)
Pt. discharged home in stable condition. Maternal and child discharge instructions and follow-up care reviewed-pt.verbalized understanding. Pt. discharged home in private vehicle with support person. Pt. stable at discharge.

## 2021-09-16 NOTE — Clinical Social Work Maternal (Signed)
CLINICAL SOCIAL WORK MATERNAL/CHILD NOTE  Patient Details  Name: Tonya Nichols MRN: [id number] Date of Birth: 05-11-1985  Date:  09/16/2021  Clinical Social Worker Initiating Note:  Doran Clay RN BSN Case Manager Date/Time: Initiated:  09/16/21/1230     Child's Name:  Tonya Nichols   Biological Parents:  Mother, Father   Need for Interpreter:  None   Reason for Referral:  Current Substance Use/Substance Use During Pregnancy     Address:  [place] [address]    Phone number:  [phone] (home)     Additional phone number: NA  Household Members/Support Persons (HM/SP):   Household Member/Support Person 1, Household Member/Support Person 2, Household Member/Support Person 3, Household Member/Support Person 4, Household Member/Support Person 5   HM/SP Name Relationship DOB or Age  HM/SP -1 Tonya Nichols son 90  HM/SP -2 Tonya Nichols daughter 8  HM/SP -[place] daughter 7  HM/SP -[place] son 3  HM/SP -[place] daughter 1  HM/SP -6        HM/SP -7        HM/SP -8          Natural Supports (not living in the home):  Children, Spouse/significant other, Immediate Family, Artist Supports:     Employment: Homemaker   Type of Work:     Education:  Other (comment) (did not make it past 7th grade)   Homebound arranged:    Museum/gallery curator Resources:  Medicaid   Other Resources:  [facility]   Cultural/Religious Considerations Which May Impact Care:  NA  Strengths:  Ability to meet basic needs  , Pediatrician chosen   Psychotropic Medications:         Pediatrician:    [employer]  Pediatrician List:   [place]      Pediatrician Fax Number:    Risk Factors/Current Problems:  Substance Use  , Transportation     Cognitive State:  Alert     Mood/Affect:  Happy  , Relaxed  , Calm     CSW  Assessment: RNCM met with patient at the bedside.  TOC consult for substance use during pregnancy.  RNCM introduced self and explained reason for visit.  Patient guessed that consult was for the cocaine use.  Patient was positive on UDS but baby was not.  [facility] blood drug detection panel was sent, TOC will follow for the results.   Patient is from home where she lives with 5 of her children.  22 father is Chrys Racer 38 years old, he does not live in the home but is active in the children's lives, he works and supports them.   Patient's daughter Tonya Nichols is 38 years old and present at the bedside, patient requests that [facility] stay during the assessment.  She reports that Tonya Nichols is not just her daughter but her best friend.  Patient is a stay at home mom, her family, mother, or daughter, or Tonya Nichols provide transportation for her and the children if needed.  They go to [facility] in [place], that is where this baby will be scheduled for an appointment. Patient admits to using cocaine but just this one time during this pregnancy, she states she does not have a drug problem.  She reports her main issue is alcohol and that she will be going  to [place] meetings at her daughter's church.  She has not had any alcohol for over a week, she says she was drinking during pregnancy but not heavily.  They have a car seat that her daughter, Tonya Nichols got for her, she has some baby clothes but not many.  They do not have a crib or bassinet, she reports that they will have something for the baby to sleep in tonight.  Patient's mother will be picking them up today.  RNCM did make a CPS report.  Patient reports that CPS has been involved before that they have investigated her multiple times in the past, last time from the school because her son had roaches in his book bag, she reports that all of the cases have been closed.  Verified her address and her telephone number is [phone].  CSW Plan/Description:  Child  Protective Service Report      Shelbie Hutching, RN 09/16/2021, 4:44 PM

## 2021-09-16 NOTE — Progress Notes (Signed)
Pt reported 1 episode of bloody emesis. No nausea/vomiting since. RN visualized bright-red liquid in trash can. Pt states she does not remember eating or drinking anything red.   CNM made aware. No new orders- pt cleared for discharge and instructed to call with any changes in symptoms or concerns.

## 2021-09-17 LAB — RHOGAM INJECTION: Unit division: 0

## 2021-09-17 LAB — TYPE AND SCREEN
ABO/RH(D): A NEG
Antibody Screen: POSITIVE

## 2021-09-23 ENCOUNTER — Inpatient Hospital Stay (HOSPITAL_COMMUNITY): Payer: Medicaid Other

## 2021-09-23 ENCOUNTER — Other Ambulatory Visit: Payer: Self-pay

## 2021-09-23 ENCOUNTER — Encounter (HOSPITAL_COMMUNITY): Payer: Self-pay | Admitting: Emergency Medicine

## 2021-09-23 ENCOUNTER — Inpatient Hospital Stay (HOSPITAL_COMMUNITY)
Admission: AD | Admit: 2021-09-23 | Discharge: 2021-09-23 | Disposition: A | Discharge status: Home / Self Care | Payer: Medicaid Other | Attending: Obstetrics and Gynecology | Admitting: Obstetrics and Gynecology

## 2021-09-23 DIAGNOSIS — Z3A38 38 weeks gestation of pregnancy: Secondary | ICD-10-CM | POA: Diagnosis not present

## 2021-09-23 DIAGNOSIS — N939 Abnormal uterine and vaginal bleeding, unspecified: Secondary | ICD-10-CM | POA: Diagnosis not present

## 2021-09-23 DIAGNOSIS — O4693 Antepartum hemorrhage, unspecified, third trimester: Secondary | ICD-10-CM | POA: Insufficient documentation

## 2021-09-23 DIAGNOSIS — O26893 Other specified pregnancy related conditions, third trimester: Secondary | ICD-10-CM | POA: Insufficient documentation

## 2021-09-23 LAB — CBC
HCT: 35.4 % — ABNORMAL LOW (ref 36.0–46.0)
Hemoglobin: 11.3 g/dL — ABNORMAL LOW (ref 12.0–15.0)
MCH: 27.6 pg (ref 26.0–34.0)
MCHC: 31.9 g/dL (ref 30.0–36.0)
MCV: 86.3 fL (ref 80.0–100.0)
Platelets: 339 10*3/uL (ref 150–400)
RBC: 4.1 MIL/uL (ref 3.87–5.11)
RDW: 15.1 % (ref 11.5–15.5)
WBC: 10.5 10*3/uL (ref 4.0–10.5)
nRBC: 0 % (ref 0.0–0.2)

## 2021-09-23 LAB — TYPE AND SCREEN
ABO/RH(D): A NEG
Antibody Screen: POSITIVE

## 2021-09-23 LAB — COMPREHENSIVE METABOLIC PANEL
ALT: 23 U/L (ref 0–44)
AST: 19 U/L (ref 15–41)
Albumin: 3.1 g/dL — ABNORMAL LOW (ref 3.5–5.0)
Alkaline Phosphatase: 91 U/L (ref 38–126)
Anion gap: 9 (ref 5–15)
BUN: 14 mg/dL (ref 6–20)
CO2: 22 mmol/L (ref 22–32)
Calcium: 8.4 mg/dL — ABNORMAL LOW (ref 8.9–10.3)
Chloride: 108 mmol/L (ref 98–111)
Creatinine, Ser: 0.87 mg/dL (ref 0.44–1.00)
GFR, Estimated: >60 mL/min (ref >60)
Glucose, Bld: 113 mg/dL — ABNORMAL HIGH (ref 70–99)
Potassium: 3.7 mmol/L (ref 3.5–5.1)
Sodium: 139 mmol/L (ref 135–145)
Total Bilirubin: 0.4 mg/dL (ref 0.3–1.2)
Total Protein: 6.4 g/dL — ABNORMAL LOW (ref 6.5–8.1)

## 2021-09-23 NOTE — ED Provider Triage Note (Signed)
Emergency Medicine Provider OB Triage Evaluation Note  Tonya Nichols is a 36 y.o. female, [id number], at [redacted]w[redacted]d gestation who presents to the emergency department with complaints of vaginal bleeding.  Patient reports that she had vaginal delivery on 09/15/2021.  Since then she has been dealing with vaginal bleeding.  Vaginal bleeding has been getting progressively worse.  Patient states that she is going through 2 pads an hour today.  Denies any abdominal or pelvic pain.  Review of  Systems  Positive: Vaginal bleeding, lightheadedness Negative: Abdominal pain, pelvic pain  Physical Exam  BP 120/70 (BP Location: Left Arm)   Pulse 78   Temp 98.8 F (37.1 C) (Oral)   Resp 14   LMP 12/05/2020 (Within Months)   SpO2 98%  General: Awake, no distress  HEENT: Atraumatic  Resp: Normal effort  Cardiac: Normal rate Abd: Nondistended, nontender  MSK: Moves all extremities without difficulty Neuro: Speech clear  Medical Decision Making  Pt evaluated for pregnancy concern and is stable for transfer to MAU. Pt is in agreement with plan for transfer.  4:38 PM Discussed with MAU APP, Lelon Mast, who accepts patient in transfer.  Clinical Impression  No diagnosis found.     Haskel Schroeder, [place] 09/23/21 1639

## 2021-09-23 NOTE — Discharge Instructions (Signed)

## 2021-09-23 NOTE — MAU Note (Addendum)
...  Tonya Nichols is a 36 y.o. at 8 days post partum here in MAU reporting: Vaginal bleeding since her vaginal delivery on 7/13. She reports her bleeding increased two days ago. She reports if she is sitting down or lying her bleeding slows down but when she stands her bleeding increases. She reports soaking through one pad an hour. She reports seeing blood clots that are half the size of a dime occasionally with the last blood clot noted this morning. She is also endorsing bilateral calf pain, bilateral thigh pain, and lower back pain. She reports these pain also began two days ago. She reports "I usually have cramps in my thighs when I am on my period so I thought this was normal." Denies vaginal odors.   Patient endorses dizziness when standing in the MAU lobby.  She states prior to these past two days her bleeding has been "heavy" but states the biggest difference is when she stands she feels it gushing out, which is new.  Patient wearing maxi pad that was placed one hour ago. 3/4 full.  Last took 600 mg Ibuprofen around 1600.   Onset of complaint: Two days ago.  Pain score:  7/10 lower back 7/10 bilateral calf pain  7/10 bilateral thighs

## 2021-09-23 NOTE — ED Triage Notes (Signed)
Patient here with complaint of vaginal bleeding after giving birth on 7/13, reports having to replace two pads per day. Patient states this was her eleventh delivery. Patient is alert, oriented, ambulatory, and in no apparent distress at this time.

## 2021-09-23 NOTE — MAU Provider Note (Signed)
History     CSN: [id number]  Arrival date and time: 09/23/21 1615   Event Date/Time   First Provider Initiated Contact with Patient 09/23/21 1910      Chief Complaint  Patient presents with   Vaginal Bleeding   Back Pain   Leg Pain   HPI Tonya Nichols is a 36 y.o. [id number] postpartum patient who presents to MAU from [facility] with chief complaint of vaginal bleeding. Patient states she has been bleeding since she gave birth on 09/15/2021. She reports saturating two pads per hour for the past two days. She denies dizziness, weakness, syncope. She is tolerating PO intake without difficulty.  Patient also reports bilateral lower leg cramping. She also reports bilateral pain in her thighs and hips. She states she typically gets these symptoms right before her period starts. She is able to ambulate independently but endorses feeling tired. She has not taken medication for these complaints. She denies SOB, chest pain, unilateral swelling, unilateral pain, skin discoloration.  Patient has outpatient [facility] GYN/BTL pre-op appt 10/06/2021.   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-took Zoloft x 1/ 1/2 mth   Gonorrhea    Grand multipara    History of HPV infection    History of UTI    2016   Insufficient prenatal care    Marijuana abuse    Post partum depression 07/20/2011   UTI (lower urinary tract infection)    Vaginal Pap smear with ASC-US     Past Surgical History:  Procedure Laterality Date   denies surgical history     NO PAST SURGERIES      Family History  Problem Relation Age of Onset   Hepatitis C  Mother    COPD Mother    Arthritis Mother    Hepatitis C Father    Cancer Father    Healthy Brother    Asthma Daughter    Healthy Daughter    Healthy Daughter    Healthy Daughter    Healthy Daughter    Healthy Daughter    Healthy Daughter    Hearing loss Son    Healthy Son    Healthy Son    Dementia Maternal Grandmother    Heart attack Maternal Grandfather    Anesthesia problems Neg Hx    Alcohol abuse Neg Hx     Social History   Tobacco Use   Smoking status: Some Days    Packs/day: 0.10    Years: 14.00    Total pack years: 1.40    Types: Cigarettes    Passive exposure: Past   Smokeless tobacco: Never   Tobacco comments:    currently smokes 3 cigarettes per week  Vaping Use   Vaping Use: Never used  Substance Use Topics   Alcohol use: Yes    Comment:  Last drank [place] iced teas 09/22/2021   Drug use: Not Currently    Types: Marijuana, Cocaine    Comment: Last cocaine use 05/2021 and marijuana use 07/2021.    Allergies: No Known Allergies  Medications Prior to Admission  Medication Sig Dispense Refill Last Dose   diphenhydrAMINE (BENADRYL) 25 MG tablet Take 25 mg by mouth every 6 (six) hours as needed.      ibuprofen (ADVIL) 600 MG tablet Take 1 tablet (600 mg total) by mouth every 6 (six) hours. 30 tablet 0    Prenatal Vit-Fe Fumarate-FA (MULTIVITAMIN-PRENATAL) 27-0.8 MG TABS tablet Take 1 tablet by mouth daily at 12 noon.       Review of Systems  Constitutional:  Positive for fatigue.  Respiratory:  Negative for shortness of breath.   Cardiovascular:  Negative for chest pain, palpitations and leg swelling.  Gastrointestinal:  Positive for abdominal pain.  Genitourinary:  Positive for pelvic pain and vaginal bleeding.  All other systems reviewed and are negative.  Physical Exam   Blood pressure 117/61, pulse 70, temperature 98 F (36.7 C), temperature source Oral, resp. rate 17, last menstrual period 12/05/2020, SpO2 99 %, not currently  breastfeeding.  Physical Exam Vitals and nursing note reviewed. Exam conducted with a chaperone present.  Constitutional:      General: She is not in acute distress.    Appearance: Normal appearance. She is not ill-appearing.  Cardiovascular:     Rate and Rhythm: Normal rate and regular rhythm.     Pulses: Normal pulses.     Heart sounds: Normal heart sounds.  Pulmonary:     Effort: Pulmonary effort is normal.     Breath sounds: Normal breath sounds.  Abdominal:     Palpations: Abdomen is soft.     Tenderness: There is no abdominal tenderness. There is no right CVA tenderness, left CVA tenderness, guarding or rebound.  Skin:    General: Skin is warm and dry.     Capillary Refill: Capillary refill takes less than 2 seconds.  Neurological:     Mental Status: She is alert and oriented to person, place, and time.  Psychiatric:        Mood and Affect: Mood normal.        Behavior: Behavior normal.        Thought Content: Thought content normal.        Judgment: Judgment normal.     MAU Course  Procedures  MDM --EMR reviewed. S/p SVD over intact perineum at [facility] 09/15/2021.  --Musculoskeletal pain consistent with postpartum state --Per postpartum care manager notes patient and family connected with CPS for resource management --Lab orders placed by [facility] prior to transfer --Reviewed Wells Criteria for DVT, low suspicion based on today's physical exam. Signs of worsening acuity reviewed with patient. Outpatient doppler imaging to be performed tomorrow declined by patient --Conferred with Dr. Vergie Living, no acute findings, no additional workup indicated at this time  Orders Placed This Encounter  Procedures   US PELVIS (TRANSABDOMINAL ONLY)   Comprehensive metabolic panel   CBC   Type and screen [facility]   Discharge patient   Vitals:   09/23/21 1729 09/23/21 1927  BP: 117/61 99/61  Pulse: 70 61  Resp: 17   Temp: 98 F (36.7 C)   SpO2: 99%    Results  for orders placed or performed during the hospital encounter of 09/23/21 (from the past 48 hour(s))  Type and screen [facility]  Status: None   Collection Time: 09/23/21  4:30 PM  Result Value Ref Range   ABO/RH(D) A NEG    Antibody Screen POS    Sample Expiration 09/26/2021,2359    Antibody Identification      PASSIVELY ACQUIRED ANTI-D Performed at [facility], [address], [place], [place] [address]   Comprehensive metabolic panel     Status: Abnormal   Collection Time: 09/23/21  4:48 PM  Result Value Ref Range   Sodium 139 135 - 145 mmol/L   Potassium 3.7 3.5 - 5.1 mmol/L   Chloride 108 98 - 111 mmol/L   CO2 22 22 - 32 mmol/L   Glucose, Bld 113 (H) 70 - 99 mg/dL    Comment: Glucose reference range applies only to samples taken after fasting for at least 8 hours.   BUN 14 6 - 20 mg/dL   Creatinine, Ser 1.77 0.44 - 1.00 mg/dL   Calcium 8.4 (L) 8.9 - 10.3 mg/dL   Total Protein 6.4 (L) 6.5 - 8.1 g/dL   Albumin 3.1 (L) 3.5 - 5.0 g/dL   AST 19 15 - 41 U/L   ALT 23 0 - 44 U/L   Alkaline Phosphatase 91 38 - 126 U/L   Total Bilirubin 0.4 0.3 - 1.2 mg/dL   GFR, Estimated >93 >90 mL/min    Comment: (NOTE) Calculated using the CKD-EPI Creatinine Equation (2021)    Anion gap 9 5 - 15    Comment: Performed at [facility], [address], [place], [place] [address]  CBC     Status: Abnormal   Collection Time: 09/23/21  4:48 PM  Result Value Ref Range   WBC 10.5 4.0 - 10.5 K/uL   RBC 4.10 3.87 - 5.11 MIL/uL   Hemoglobin 11.3 (L) 12.0 - 15.0 g/dL   HCT 33.0 (L) 07.6 - 22.6 %   MCV 86.3 80.0 - 100.0 fL   MCH 27.6 26.0 - 34.0 pg   MCHC 31.9 30.0 - 36.0 g/dL   RDW 33.3 54.5 - 62.5 %   Platelets 339 150 - 400 K/uL   nRBC 0.0 0.0 - 0.2 %    Comment: Performed at [facility], [address] [place], [place] [address]   US PELVIS (TRANSABDOMINAL ONLY)  Result Date: 09/23/2021 CLINICAL DATA:  [id number].  Vaginal bleeding for 8 days postpartum.  EXAM: TRANSABDOMINAL ULTRASOUND OF PELVIS TECHNIQUE: Transabdominal ultrasound examination of the pelvis was performed including evaluation of the uterus, ovaries, adnexal regions, and pelvic cul-de-sac. COMPARISON:  None Available. FINDINGS: Uterus Measurements: 12.3 x 7.1 x 10.2 cm = volume: 468 mL. No fibroids or other mass visualized. Endometrium Thickness: 16 mm.  Heterogeneous nonvascular hypoechoic endometrium. Right ovary Measurements: 2.4 x 1.5 x 1.6 cm = volume: 2.9 mL. Normal appearance/no adnexal mass. Left ovary Measurements: 1.8 x 1.2 x 1.3 cm = volume: 1.5 mL. Normal appearance/no adnexal mass. Other findings:  No abnormal free fluid. IMPRESSION: Thickened heterogeneous nonvascular endometrium suggestive of blood clot. No definite findings to suggest retained product of conception. Electronically Signed   By: Tish Frederickson M.D.   On: 09/23/2021 18:59     Assessment and Plan  --36 y.o. [id number] 8 days postpartum --No acute findings on evaluation in MAU --Patient dozing throughout encounter --Already connected with case management --Discharge home in stable condition with standard precautions  Calvert Cantor, MSA, MSN, CNM

## 2021-10-06 ENCOUNTER — Telehealth: Payer: Medicaid Other | Admitting: Obstetrics and Gynecology

## 2021-10-06 ENCOUNTER — Encounter: Payer: Medicaid Other | Admitting: Obstetrics and Gynecology

## 2021-10-06 DIAGNOSIS — Z3009 Encounter for other general counseling and advice on contraception: Secondary | ICD-10-CM

## 2021-11-10 ENCOUNTER — Encounter: Payer: Medicaid Other | Admitting: Obstetrics and Gynecology

## 2021-11-10 DIAGNOSIS — Z3009 Encounter for other general counseling and advice on contraception: Secondary | ICD-10-CM

## 2021-12-23 ENCOUNTER — Ambulatory Visit: Payer: Medicaid Other

## 2022-05-18 ENCOUNTER — Ambulatory Visit: Payer: Medicaid Other

## 2022-06-06 ENCOUNTER — Ambulatory Visit: Payer: Medicaid Other

## 2022-06-07 NOTE — Progress Notes (Unsigned)
There were no vitals taken for this visit.   Subjective:    Patient ID: Tonya Nichols, female    DOB: [birth year], 37 y.o.   MRN: [id number]  HPI: Tonya Nichols is a 37 y.o. female  No chief complaint on file.  Establish care: her last physical was ***.  Medical history includes ***.  Family history includes ***.  Health maintenance ***.   Relevant past medical, surgical, family and social history reviewed and updated as indicated. Interim medical history since our last visit reviewed. Allergies and medications reviewed and updated.  Review of Systems  Constitutional: Negative for fever or weight change.  Respiratory: Negative for cough and shortness of breath.   Cardiovascular: Negative for chest pain or palpitations.  Gastrointestinal: Negative for abdominal pain, no bowel changes.  Musculoskeletal: Negative for gait problem or joint swelling.  Skin: Negative for rash.  Neurological: Negative for dizziness or headache.  No other specific complaints in a complete review of systems (except as listed in HPI above).      Objective:    There were no vitals taken for this visit.  Wt Readings from Last 3 Encounters:  09/15/21 198 lb 6.6 oz (90 kg)  07/25/21 193 lb 9.6 oz (87.8 kg)  06/23/21 180 lb (81.6 kg)    Physical Exam  Constitutional: Patient appears well-developed and well-nourished. Obese *** No distress.  HEENT: head atraumatic, normocephalic, pupils equal and reactive to light, ears ***, neck supple, throat within normal limits Cardiovascular: Normal rate, regular rhythm and normal heart sounds.  No murmur heard. No BLE edema. Pulmonary/Chest: Effort normal and breath sounds normal. No respiratory distress. Abdominal: Soft.  There is no tenderness. Psychiatric: Patient has a normal mood and affect. behavior is normal. Judgment and thought content normal.  Results for orders placed or performed during the hospital encounter of 09/23/21  Comprehensive metabolic panel   Result Value Ref Range   Sodium 139 135 - 145 mmol/L   Potassium 3.7 3.5 - 5.1 mmol/L   Chloride 108 98 - 111 mmol/L   CO2 22 22 - 32 mmol/L   Glucose, Bld 113 (H) 70 - 99 mg/dL   BUN 14 6 - 20 mg/dL   Creatinine, Ser 0.87 0.44 - 1.00 mg/dL   Calcium 8.4 (L) 8.9 - 10.3 mg/dL   Total Protein 6.4 (L) 6.5 - 8.1 g/dL   Albumin 3.1 (L) 3.5 - 5.0 g/dL   AST 19 15 - 41 U/L   ALT 23 0 - 44 U/L   Alkaline Phosphatase 91 38 - 126 U/L   Total Bilirubin 0.4 0.3 - 1.2 mg/dL   GFR, Estimated >60 >60 mL/min   Anion gap 9 5 - 15  CBC  Result Value Ref Range   WBC 10.5 4.0 - 10.5 K/uL   RBC 4.10 3.87 - 5.11 MIL/uL   Hemoglobin 11.3 (L) 12.0 - 15.0 g/dL   HCT 35.4 (L) 36.0 - 46.0 %   MCV 86.3 80.0 - 100.0 fL   MCH 27.6 26.0 - 34.0 pg   MCHC 31.9 30.0 - 36.0 g/dL   RDW 15.1 11.5 - 15.5 %   Platelets 339 150 - 400 K/uL   nRBC 0.0 0.0 - 0.2 %  Type and screen [facility]  Result Value Ref Range   ABO/RH(D) A NEG    Antibody Screen POS    Sample Expiration 09/26/2021,2359    Antibody Identification      PASSIVELY ACQUIRED ANTI-D Performed at [facility]  [facility], [place] [address], [place], [place] [address]       Assessment & Plan:   Problem List Items Addressed This Visit   None    Follow up plan: No follow-ups on file.

## 2022-06-08 ENCOUNTER — Other Ambulatory Visit: Payer: Self-pay

## 2022-06-08 ENCOUNTER — Encounter: Payer: Self-pay | Admitting: Nurse Practitioner

## 2022-06-08 ENCOUNTER — Ambulatory Visit: Payer: Medicaid Other | Admitting: Nurse Practitioner

## 2022-06-08 VITALS — BP 122/74 | HR 98 | Temp 98.1°F | Resp 16 | Ht 63.0 in | Wt 204.5 lb

## 2022-06-08 DIAGNOSIS — F330 Major depressive disorder, recurrent, mild: Secondary | ICD-10-CM | POA: Diagnosis not present

## 2022-06-08 DIAGNOSIS — M5431 Sciatica, right side: Secondary | ICD-10-CM | POA: Diagnosis not present

## 2022-06-08 DIAGNOSIS — Z3009 Encounter for other general counseling and advice on contraception: Secondary | ICD-10-CM

## 2022-06-08 DIAGNOSIS — Z3042 Encounter for surveillance of injectable contraceptive: Secondary | ICD-10-CM | POA: Diagnosis not present

## 2022-06-08 DIAGNOSIS — Z7689 Persons encountering health services in other specified circumstances: Secondary | ICD-10-CM

## 2022-06-08 LAB — POCT URINE PREGNANCY: Preg Test, Ur: NEGATIVE

## 2022-06-08 MED ORDER — PREDNISONE 10 MG (21) PO TBPK
ORAL_TABLET | ORAL | 0 refills | Status: DC
Start: 2022-06-08 — End: 2022-10-24

## 2022-06-08 MED ORDER — MEDROXYPROGESTERONE ACETATE 150 MG/ML IM SUSP
150.0000 mg | INTRAMUSCULAR | 3 refills | Status: DC
Start: 2022-06-08 — End: 2023-06-06

## 2022-06-08 MED ORDER — CYCLOBENZAPRINE HCL 10 MG PO TABS
10.0000 mg | ORAL_TABLET | Freq: Three times a day (TID) | ORAL | 0 refills | Status: DC | PRN
Start: 2022-06-08 — End: 2022-10-24

## 2022-06-08 MED ORDER — MEDROXYPROGESTERONE ACETATE 150 MG/ML IM SUSY
150.0000 mg | PREFILLED_SYRINGE | INTRAMUSCULAR | Status: DC
Start: 2022-06-08 — End: 2023-09-26
  Administered 2022-06-08: 150 mg via INTRAMUSCULAR

## 2022-06-08 MED ORDER — NAPROXEN 500 MG PO TABS
500.0000 mg | ORAL_TABLET | Freq: Two times a day (BID) | ORAL | 0 refills | Status: DC
Start: 2022-06-08 — End: 2022-07-04

## 2022-06-08 NOTE — Addendum Note (Signed)
Addended by: Royal Hawthorn on: 06/08/2022 12:30 PM   Modules accepted: Orders

## 2022-06-08 NOTE — Assessment & Plan Note (Signed)
Currently on lexapro and hydroxyzine, she is being seen at [facility]

## 2022-07-02 ENCOUNTER — Other Ambulatory Visit: Payer: Self-pay | Admitting: Nurse Practitioner

## 2022-07-02 DIAGNOSIS — M5431 Sciatica, right side: Secondary | ICD-10-CM

## 2022-07-04 NOTE — Telephone Encounter (Signed)
Requested Prescriptions  Pending Prescriptions Disp Refills   naproxen (NAPROSYN) 500 MG tablet [Pharmacy Med Name: Naproxen 500 MG Oral Tablet] 60 tablet 0    Sig: TAKE 1 TABLET BY MOUTH TWICE DAILY WITH A MEAL     Analgesics:  NSAIDS Failed - 07/02/2022  6:30 PM      Failed - Manual Review: Labs are only required if the patient has taken medication for more than 8 weeks.      Failed - HGB in normal range and within 360 days    Hemoglobin  Date Value Ref Range Status  09/23/2021 11.3 (L) 12.0 - 15.0 g/dL Final  16/12/9602 54.0 11.1 - 15.9 g/dL Final         Failed - HCT in normal range and within 360 days    HCT  Date Value Ref Range Status  09/23/2021 35.4 (L) 36.0 - 46.0 % Final   Hematocrit  Date Value Ref Range Status  07/25/2021 33.4 (L) 34.0 - 46.6 % Final         Passed - Cr in normal range and within 360 days    Creatinine  Date Value Ref Range Status  09/10/2012 0.59 (L) 0.60 - 1.30 mg/dL Final   Creatinine, Ser  Date Value Ref Range Status  09/23/2021 0.87 0.44 - 1.00 mg/dL Final         Passed - PLT in normal range and within 360 days    Platelets  Date Value Ref Range Status  09/23/2021 339 150 - 400 K/uL Final  07/25/2021 208 150 - 450 x10E3/uL Final         Passed - eGFR is 30 or above and within 360 days    EGFR (African American)  Date Value Ref Range Status  09/10/2012 >60  Final   GFR calc Af Amer  Date Value Ref Range Status  11/11/2019 >60 >60 mL/min Final   EGFR (Non-African Amer.)  Date Value Ref Range Status  09/10/2012 >60  Final    Comment:    eGFR values <71mL/min/1.73 m2 may be an indication of chronic kidney disease (CKD). Calculated eGFR is useful in patients with stable renal function. The eGFR calculation will not be reliable in acutely ill patients when serum creatinine is changing rapidly. It is not useful in  patients on dialysis. The eGFR calculation may not be applicable to patients at the low and high extremes of body  sizes, pregnant women, and vegetarians.    GFR, Estimated  Date Value Ref Range Status  09/23/2021 >60 >60 mL/min Final    Comment:    (NOTE) Calculated using the CKD-EPI Creatinine Equation (2021)    eGFR  Date Value Ref Range Status  07/25/2021 121 >59 mL/min/1.73 Final         Passed - Patient is not pregnant      Passed - Valid encounter within last 12 months    Recent Outpatient Visits           3 weeks ago Sciatica of right side   [facility] Berniece Salines, FNP       Future Appointments             In 2 weeks Zane Herald, Rudolpho Sevin, FNP [facility], [facility]

## 2022-07-18 NOTE — Progress Notes (Deleted)
Name: Tonya Nichols   MRN: [id number]    DOB: [birth year]   Date:07/18/2022       Progress Note  Subjective  Chief Complaint  No chief complaint on file.   HPI  Patient presents for annual CPE.  Diet: *** Exercise: ***  Sleep: *** Last dental exam:*** Last eye exam: ***  Flowsheet Row Office Visit from 06/08/2022 in [facility]  AUDIT-C Score 0      Depression: Phq 9 is  {Desc; negative/positive:13464}    07/25/2021    1:31 PM 04/21/2021    9:41 AM 08/20/2019    2:46 PM  Depression screen PHQ 2/9  Decreased Interest 0 0 0  Down, Depressed, Hopeless 0 0 0  PHQ - 2 Score 0 0 0  Altered sleeping 3  0  Tired, decreased energy 0  0  Change in appetite 0  0  Feeling bad or failure about yourself  0  0  Trouble concentrating 0  0  Moving slowly or fidgety/restless 0  0  Suicidal thoughts 0  0  PHQ-9 Score 3  0  Difficult doing work/chores Somewhat difficult     Hypertension: BP Readings from Last 3 Encounters:  06/08/22 122/74  09/23/21 99/61  09/16/21 107/60   Obesity: Wt Readings from Last 3 Encounters:  06/08/22 204 lb 8 oz (92.8 kg)  09/15/21 198 lb 6.6 oz (90 kg)  07/25/21 193 lb 9.6 oz (87.8 kg)   BMI Readings from Last 3 Encounters:  06/08/22 36.23 kg/m  09/15/21 35.15 kg/m  07/25/21 34.29 kg/m     Vaccines:  HPV: up to at age 32 , ask insurance if age between 53-45  Shingrix: 73-64 yo and ask insurance if covered when patient above 59 yo Pneumonia:  educated and discussed with patient. Flu:  educated and discussed with patient.  Hep C Screening: 07/25/2021 STD testing and prevention (HIV/chl/gon/syphilis): 01/01/2018 Intimate partner violence:*** Sexual History : Menstrual History/LMP/Abnormal Bleeding:  Incontinence Symptoms:   Breast cancer:  - Last Mammogram: no concerns, does not qualify - BRCA gene screening: none  Osteoporosis: Discussed high calcium and vitamin D supplementation, weight bearing  exercises  Cervical cancer screening: 04/28/2014, due  Skin cancer: Discussed monitoring for atypical lesions  Colorectal cancer: no concerns, does not qualify   Lung cancer:   Low Dose CT Chest recommended if Age 42-80 years, 20 pack-year currently smoking OR have quit w/in 15years. Patient does not qualify.   ECG: none  Advanced Care Planning: A voluntary discussion about advance care planning including the explanation and discussion of advance directives.  Discussed health care proxy and Living will, and the patient was able to identify a health care proxy as ***.  Patient {DOES_DOES YQM:57846} have a living will at present time. If patient does have living will, I have requested they bring this to the clinic to be scanned in to their chart.  Lipids: No results found for: "CHOL" No results found for: "HDL" No results found for: "LDLCALC" No results found for: "TRIG" No results found for: "CHOLHDL" No results found for: "LDLDIRECT"  Glucose: Glucose  Date Value Ref Range Status  07/25/2021 92 70 - 99 mg/dL Final  96/29/5284 87 65 - 99 mg/dL Final   Glucose Tolerance, Fasting  Date Value Ref Range Status  04/17/2011 71 70 - 104 mg/dL Final   Glucose, Bld  Date Value Ref Range Status  09/23/2021 113 (H) 70 - 99 mg/dL Final    Comment:  Glucose reference range applies only to samples taken after fasting for at least 8 hours.  06/23/2021 88 70 - 99 mg/dL Final    Comment:    Glucose reference range applies only to samples taken after fasting for at least 8 hours.  06/10/2021 123 (H) 70 - 99 mg/dL Final    Comment:    Glucose reference range applies only to samples taken after fasting for at least 8 hours.    Patient Active Problem List   Diagnosis Date Noted   Mild episode of recurrent major depressive disorder (HCC) 06/08/2022   Precipitous delivery 09/15/2021   Postpartum care following vaginal delivery 09/15/2021   Supervision of elderly multigravida, antepartum  07/25/2021   Rh negative state in antepartum period 07/25/2021   Abdominal pain during pregnancy in second trimester 06/10/2021   Alcohol abuse    Cocaine use complicating pregnancy    Marijuana abuse     Past Surgical History:  Procedure Laterality Date   denies surgical history     NO PAST SURGERIES      Family History  Problem Relation Age of Onset   Hepatitis C Mother    COPD Mother    Arthritis Mother    Hepatitis C Father    Cancer Father    Healthy Brother    Asthma Daughter    Healthy Daughter    Healthy Daughter    Healthy Daughter    Healthy Daughter    Healthy Daughter    Healthy Daughter    Hearing loss Son    Healthy Son    Healthy Son    Dementia Maternal Grandmother    Heart attack Maternal Grandfather    Anesthesia problems Neg Hx    Alcohol abuse Neg Hx     Social History   Socioeconomic History   Marital status: Single    Spouse name: Not on file   Number of children: 11   Years of education: 7   Highest education level: 7th grade  Occupational History   Occupation: unemployed  Tobacco Use   Smoking status: Former    Packs/day: 0.10    Years: 14.00    Additional pack years: 0.00    Total pack years: 1.40    Types: Cigarettes    Quit date: 01/28/2022    Years since quitting: 0.4    Passive exposure: Past   Smokeless tobacco: Never   Tobacco comments:    currently smokes 3 cigarettes per week  Vaping Use   Vaping Use: Never used  Substance and Sexual Activity   Alcohol use: Not Currently    Comment: Last drank [place] iced teas 09/22/2021   Drug use: Not Currently    Types: Marijuana, Cocaine    Comment: Last cocaine use 05/2021 and marijuana use 07/2021.   Sexual activity: Yes    Birth control/protection: None  Other Topics Concern   Not on file  Social History Narrative   Lives with  6 of her children.   Social Determinants of Health   Financial Resource Strain: Low Risk  (07/25/2021)   Overall Financial Resource Strain  (CARDIA)    Difficulty of Paying Living Expenses: Not hard at all  Food Insecurity: No Food Insecurity (07/25/2021)   Hunger Vital Sign    Worried About Running Out of Food in the Last Year: Never true    Ran Out of Food in the Last Year: Never true  Transportation Needs: Unmet Transportation Needs (07/25/2021)   PRAPARE - Transportation  Lack of Transportation (Medical): Yes    Lack of Transportation (Non-Medical): Yes  Physical Activity: Not on file  Stress: Not on file  Social Connections: Not on file  Intimate Partner Violence: Not At Risk (07/25/2021)   Humiliation, Afraid, Rape, and Kick questionnaire    Fear of Current or Ex-Partner: No    Emotionally Abused: No    Physically Abused: No    Sexually Abused: No     Current Outpatient Medications:    cyclobenzaprine (FLEXERIL) 10 MG tablet, Take 1 tablet (10 mg total) by mouth 3 (three) times daily as needed for muscle spasms., Disp: 60 tablet, Rfl: 0   escitalopram (LEXAPRO) 10 MG tablet, Take 10 mg by mouth daily., Disp: , Rfl:    hydrOXYzine (ATARAX) 50 MG tablet, Take 50 mg by mouth at bedtime., Disp: , Rfl:    medroxyPROGESTERone (DEPO-PROVERA) 150 MG/ML injection, Inject 1 mL (150 mg total) into the muscle every 3 (three) months., Disp: 1 mL, Rfl: 3   naproxen (NAPROSYN) 500 MG tablet, TAKE 1 TABLET BY MOUTH TWICE DAILY WITH A MEAL, Disp: 60 tablet, Rfl: 0   predniSONE (STERAPRED UNI-PAK 21 TAB) 10 MG (21) TBPK tablet, Take as directed on package.  (60 mg po on day 1, 50 mg po on day 2...), Disp: 21 tablet, Rfl: 0  Current Facility-Administered Medications:    medroxyPROGESTERone Acetate SUSY 150 mg, 150 mg, Intramuscular, Q90 days, Berniece Salines, FNP, 150 mg at 06/08/22 1229  No Known Allergies   ROS  Constitutional: Negative for fever or weight change.  Respiratory: Negative for cough and shortness of breath.   Cardiovascular: Negative for chest pain or palpitations.  Gastrointestinal: Negative for abdominal  pain, no bowel changes.  Musculoskeletal: Negative for gait problem or joint swelling.  Skin: Negative for rash.  Neurological: Negative for dizziness or headache.  No other specific complaints in a complete review of systems (except as listed in HPI above).   Objective  There were no vitals filed for this visit.  There is no height or weight on file to calculate BMI.  Physical Exam Constitutional: Patient appears well-developed and well-nourished. No distress.  HENT: Head: Normocephalic and atraumatic. Ears: B TMs ok, no erythema or effusion; Nose: Nose normal. Mouth/Throat: Oropharynx is clear and moist. No oropharyngeal exudate.  Eyes: Conjunctivae and EOM are normal. Pupils are equal, round, and reactive to light. No scleral icterus.  Neck: Normal range of motion. Neck supple. No JVD present. No thyromegaly present.  Cardiovascular: Normal rate, regular rhythm and normal heart sounds.  No murmur heard. No BLE edema. Pulmonary/Chest: Effort normal and breath sounds normal. No respiratory distress. Abdominal: Soft. Bowel sounds are normal, no distension. There is no tenderness. no masses Breast: no lumps or masses, no nipple discharge or rashes FEMALE GENITALIA:  External genitalia normal External urethra normal Vaginal vault normal without discharge or lesions Cervix normal without discharge or lesions Bimanual exam normal without masses RECTAL: no rectal masses or hemorrhoids Musculoskeletal: Normal range of motion, no joint effusions. No gross deformities Neurological: he is alert and oriented to person, place, and time. No cranial nerve deficit. Coordination, balance, strength, speech and gait are normal.  Skin: Skin is warm and dry. No rash noted. No erythema.  Psychiatric: Patient has a normal mood and affect. behavior is normal. Judgment and thought content normal.   Recent Results (from the past 2160 hour(s))  POCT urine pregnancy     Status: None   Collection Time:  06/08/22 10:28  AM  Result Value Ref Range   Preg Test, Ur Negative Negative     Fall Risk:    06/08/2022    9:50 AM 08/31/2014    2:59 PM  Fall Risk   Falls in the past year? 0 No  Number falls in past yr: 0   Injury with Fall? 0    ***  Functional Status Survey:   ***  Assessment & Plan  There are no diagnoses linked to this encounter.  -USPSTF grade A and B recommendations reviewed with patient; age-appropriate recommendations, preventive care, screening tests, etc discussed and encouraged; healthy living encouraged; see AVS for patient education given to patient -Discussed importance of 150 minutes of physical activity weekly, eat two servings of fish weekly, eat one serving of tree nuts ( cashews, pistachios, pecans, almonds.Marland Kitchen) every other day, eat 6 servings of fruit/vegetables daily and drink plenty of water and avoid sweet beverages.

## 2022-07-19 ENCOUNTER — Encounter: Payer: Medicaid Other | Admitting: Nurse Practitioner

## 2022-07-19 DIAGNOSIS — Z0000 Encounter for general adult medical examination without abnormal findings: Secondary | ICD-10-CM

## 2022-07-19 DIAGNOSIS — Z124 Encounter for screening for malignant neoplasm of cervix: Secondary | ICD-10-CM

## 2022-07-20 ENCOUNTER — Telehealth: Payer: Self-pay | Admitting: Nurse Practitioner

## 2022-07-20 NOTE — Telephone Encounter (Signed)
Copied from CRM [id number]. Topic: General - Other >> Jul 20, 2022 11:20 AM Macon Large wrote: Reason for CRM: Pt requests that a list of her current medications be faxed to [facility] fax# [phone]

## 2022-07-20 NOTE — Telephone Encounter (Signed)
Faxed to [facility]

## 2022-08-22 ENCOUNTER — Other Ambulatory Visit: Payer: Self-pay

## 2022-08-22 ENCOUNTER — Ambulatory Visit (INDEPENDENT_AMBULATORY_CARE_PROVIDER_SITE_OTHER): Payer: Medicaid Other | Admitting: Physician Assistant

## 2022-08-22 ENCOUNTER — Encounter: Payer: Self-pay | Admitting: Physician Assistant

## 2022-08-22 ENCOUNTER — Ambulatory Visit: Payer: Self-pay

## 2022-08-22 ENCOUNTER — Other Ambulatory Visit (HOSPITAL_COMMUNITY)
Admission: RE | Admit: 2022-08-22 | Discharge: 2022-08-22 | Disposition: A | Discharge status: Home / Self Care | Payer: Medicaid Other | Source: Ambulatory Visit | Attending: Physician Assistant | Admitting: Physician Assistant

## 2022-08-22 VITALS — BP 124/72 | HR 95 | Temp 98.1°F | Resp 16 | Ht 65.0 in | Wt 200.1 lb

## 2022-08-22 DIAGNOSIS — R8281 Pyuria: Secondary | ICD-10-CM | POA: Diagnosis not present

## 2022-08-22 DIAGNOSIS — Z202 Contact with and (suspected) exposure to infections with a predominantly sexual mode of transmission: Secondary | ICD-10-CM

## 2022-08-22 LAB — POCT URINALYSIS DIPSTICK
Bilirubin, UA: NEGATIVE
Glucose, UA: NEGATIVE
Ketones, UA: NEGATIVE
Nitrite, UA: NEGATIVE
Protein, UA: POSITIVE — AB
Spec Grav, UA: 1.02 (ref 1.010–1.025)
Urobilinogen, UA: 0.2 E.U./dL
pH, UA: 6 (ref 5.0–8.0)

## 2022-08-22 LAB — POCT URINE PREGNANCY: Preg Test, Ur: NEGATIVE

## 2022-08-22 MED ORDER — CEFTRIAXONE SODIUM 500 MG IJ SOLR
500.0000 mg | Freq: Once | INTRAMUSCULAR | Status: AC
Start: 2022-08-22 — End: 2022-08-22
  Administered 2022-08-22: 500 mg via INTRAMUSCULAR

## 2022-08-22 MED ORDER — CEFTRIAXONE SODIUM 1 G IJ SOLR
1.0000 g | Freq: Once | INTRAMUSCULAR | Status: DC
Start: 2022-08-22 — End: 2022-08-22

## 2022-08-22 MED ORDER — AZITHROMYCIN 500 MG PO TABS
1000.0000 mg | ORAL_TABLET | Freq: Once | ORAL | 0 refills | Status: AC
Start: 2022-08-22 — End: 2022-08-22

## 2022-08-22 NOTE — Telephone Encounter (Signed)
  Chief Complaint: Abdominal pain, pain with urination, Partner diagnosed with STI Symptoms: Pain with urination vaginal discharge Frequency: 1 week Pertinent Negatives: Patient denies  Disposition: [] ED /[] Urgent Care (no appt availability in office) / [x] Appointment(In office/virtual)/ []  [facility]/ [] Home Care/ [] Refused Recommended Disposition /[] [facility]/ []  Follow-up with PCP Additional Notes: Pt states that partner has STI, and thinks she has gotten it from him.  Reason for Disposition  [1] MILD-MODERATE pain AND [2] constant AND [3] present > 2 hours  Answer Assessment - Initial Assessment Questions 1. LOCATION: "Where does it hurt?"      Abdominal Pain - now and again when has to urinate. 2. RADIATION: "Does the pain shoot anywhere else?" (e.g., chest, back)     no 3. ONSET: "When did the pain begin?" (e.g., minutes, hours or days ago)      1 week 4. SUDDEN: "Gradual or sudden onset?"     Gradual  8. CAUSE: "What do you think is causing the stomach pain?"     STI  10. OTHER SYMPTOMS: "Do you have any other symptoms?" (e.g., back pain, diarrhea, fever, urination pain, vomiting)       Vaginal discharge 11. PREGNANCY: "Is there any chance you are pregnant?" "When was your last menstrual period?"       No  Protocols used: Abdominal Pain - [facility]

## 2022-08-22 NOTE — Progress Notes (Signed)
Acute Office Visit   Patient: Tonya Nichols   DOB: 06/04/1985   37 y.o. Female  MRN: [id number] Visit Date: 08/22/2022  Today's healthcare provider: Oswaldo Conroy Gale Klar, PA-C  Introduced myself to the patient as a Secondary school teacher and provided education on APPs in clinical practice.    Chief Complaint  Patient presents with   Exposure to STD   Depression   Subjective    HPI    Concern for STD  She reports her partner is having penile discharge and discomfort - she reports her current partner is the only partner she has been sexually active with in the last few months   She reports vaginal discharge currently and bright red vaginal bleeding  She also reports urinary urgency and hesitancy  She reports this started about a week ago   Medications: Outpatient Medications Prior to Visit  Medication Sig   medroxyPROGESTERone (DEPO-PROVERA) 150 MG/ML injection Inject 1 mL (150 mg total) into the muscle every 3 (three) months.   cyclobenzaprine (FLEXERIL) 10 MG tablet Take 1 tablet (10 mg total) by mouth 3 (three) times daily as needed for muscle spasms. (Patient not taking: Reported on 08/22/2022)   escitalopram (LEXAPRO) 10 MG tablet Take 10 mg by mouth daily. (Patient not taking: Reported on 08/22/2022)   hydrOXYzine (ATARAX) 50 MG tablet Take 50 mg by mouth at bedtime. (Patient not taking: Reported on 08/22/2022)   naproxen (NAPROSYN) 500 MG tablet TAKE 1 TABLET BY MOUTH TWICE DAILY WITH A MEAL (Patient not taking: Reported on 08/22/2022)   predniSONE (STERAPRED UNI-PAK 21 TAB) 10 MG (21) TBPK tablet Take as directed on package.  (60 mg po on day 1, 50 mg po on day 2...) (Patient not taking: Reported on 08/22/2022)   Facility-Administered Medications Prior to Visit  Medication Dose Route Frequency Provider   medroxyPROGESTERone Acetate SUSY 150 mg  150 mg Intramuscular Q90 days Berniece Salines, FNP    Review of Systems  Constitutional:  Negative for chills and fever.  Gastrointestinal:   Positive for abdominal pain (suprapubic abdominal pain). Negative for nausea and vomiting.  Genitourinary:  Positive for difficulty urinating, dysuria, flank pain, frequency, urgency, vaginal bleeding and vaginal discharge. Negative for hematuria.       Objective    BP 124/72   Pulse 95   Temp 98.1 F (36.7 C) (Oral)   Resp 16   Ht 5\' 5"  (1.651 m)   Wt 200 lb 1.6 oz (90.8 kg)   LMP 08/21/2022   SpO2 98%   BMI 33.30 kg/m    Physical Exam Vitals reviewed.  Constitutional:      General: She is awake.     Appearance: Normal appearance. She is well-developed and well-groomed.  HENT:     Head: Normocephalic and atraumatic.  Pulmonary:     Effort: Pulmonary effort is normal.  Abdominal:     General: Abdomen is flat.     Palpations: Abdomen is soft.     Tenderness: There is abdominal tenderness in the suprapubic area.  Musculoskeletal:     Cervical back: Normal range of motion.  Skin:    General: Skin is warm and dry.  Neurological:     General: No focal deficit present.     Mental Status: She is alert and oriented to person, place, and time.  Psychiatric:        Mood and Affect: Mood normal.        Behavior: Behavior normal.  Behavior is cooperative.       Results for orders placed or performed in visit on 08/22/22  POCT urinalysis dipstick  Result Value Ref Range   Color, UA gold    Clarity, UA cloudy    Glucose, UA Negative Negative   Bilirubin, UA negative    Ketones, UA negative    Spec Grav, UA 1.020 1.010 - 1.025   Blood, UA large    pH, UA 6.0 5.0 - 8.0   Protein, UA Positive (A) Negative   Urobilinogen, UA 0.2 0.2 or 1.0 E.U./dL   Nitrite, UA negative    Leukocytes, UA Moderate (2+) (A) Negative   Appearance cloudy    Odor none   POCT urine pregnancy  Result Value Ref Range   Preg Test, Ur Negative Negative    Assessment & Plan      No follow-ups on file.        Problem List Items Addressed This Visit   None Visit Diagnoses     STD  exposure    -  Primary Acute, new concern Patient reports vaginal discharge and bleeding, abdominal pain, flank pain and dysuria that started about a week ago. She reports her partner has had penile discharge and discomfort as well UA was suspicious for UTI but will send for culture for more definitive dx given concurrent concerns for UTI- UA results were discussed with her during apt Will get Cervicovaginal swab for gonorrhea, chlamydia,trichomonas, yeast, BV testing- results to dictate further management Will also get HIV and RPR for further testing Given positive UA, HPI and current symptoms along with concerns for unreliable follow up- will go ahead and provide Rocephin 1 gm IM and Azithromycin 1 gm PO once for coverage against gonorrhea and chlamydia. Rocephin should also provide coverage for potential UTI until culture returns We reviewed follow up measures for test of cure for gonorrhea and chlamydia and referrals that would be required for potential positive HIV testing Patient voiced understanding and agreement Recommend follow up with PCP in about 1 week for Depo shot and to discuss other concerns.      Relevant Medications   cefTRIAXone (ROCEPHIN) injection 500 mg (Completed)   cefTRIAXone (ROCEPHIN) injection 500 mg (Completed)   Other Relevant Orders   Cervicovaginal ancillary only   POCT urinalysis dipstick (Completed)   POCT urine pregnancy (Completed)   HIV antibody (with reflex)   RPR   Pyuria       Relevant Medications   cefTRIAXone (ROCEPHIN) injection 500 mg (Completed)   Other Relevant Orders   Urine Culture        No follow-ups on file.   I, Antonio Creswell E Brookes Craine, PA-C, have reviewed all documentation for this visit. The documentation on 08/23/22 for the exam, diagnosis, procedures, and orders are all accurate and complete.   Jacquelin Hawking, MHS, PA-C [facility]

## 2022-08-23 LAB — CERVICOVAGINAL ANCILLARY ONLY
Comment: NEGATIVE
Comment: NEGATIVE
Comment: NEGATIVE
Comment: NEGATIVE
Comment: NEGATIVE
Comment: NORMAL

## 2022-08-23 LAB — URINE CULTURE
MICRO NUMBER:: 15098072
SPECIMEN QUALITY:: ADEQUATE

## 2022-08-23 LAB — RPR: RPR Ser Ql: NONREACTIVE

## 2022-08-23 LAB — HIV ANTIBODY (ROUTINE TESTING W REFLEX): HIV 1&2 Ab, 4th Generation: NONREACTIVE

## 2022-08-24 NOTE — Progress Notes (Signed)
HIV and Syphilis testing were negative.  Her urine culture did not show much in regards to bacterial growth - if there was something there, it was likely managed with the antibiotics we provided in the office She needs to return to the office to repeat her cervicovaginal swab testing as there was not an adequate sample collected for testing.

## 2022-08-30 NOTE — Progress Notes (Deleted)
Name: Tonya Nichols   MRN: [id number]    DOB: 1985-04-26   Date:08/30/2022       Progress Note  Subjective  Chief Complaint  No chief complaint on file.   HPI  Patient presents for annual CPE.  Diet: *** Exercise: ***  Last Eye Exam: *** Last Dental Exam: ***  Flowsheet Row Office Visit from 08/22/2022 in [facility]  AUDIT-C Score 0      Depression: Phq 9 is  {Desc; negative/positive:13464}    08/22/2022   10:29 AM 07/25/2021    1:31 PM 04/21/2021    9:41 AM 08/20/2019    2:46 PM  Depression screen PHQ 2/9  Decreased Interest 0 0 0 0  Down, Depressed, Hopeless 3 0 0 0  PHQ - 2 Score 3 0 0 0  Altered sleeping 3 3  0  Tired, decreased energy 3 0  0  Change in appetite 1 0  0  Feeling bad or failure about yourself  3 0  0  Trouble concentrating 2 0  0  Moving slowly or fidgety/restless 1 0  0  Suicidal thoughts 0 0  0  PHQ-9 Score 16 3  0  Difficult doing work/chores Somewhat difficult Somewhat difficult     Hypertension: BP Readings from Last 3 Encounters:  08/22/22 124/72  06/08/22 122/74  09/23/21 99/61   Obesity: Wt Readings from Last 3 Encounters:  08/22/22 200 lb 1.6 oz (90.8 kg)  06/08/22 204 lb 8 oz (92.8 kg)  09/15/21 198 lb 6.6 oz (90 kg)   BMI Readings from Last 3 Encounters:  08/22/22 33.30 kg/m  06/08/22 36.23 kg/m  09/15/21 35.15 kg/m     Vaccines:   HPV:  Tdap:  Shingrix:  Pneumonia:  Flu:  COVID-19:   Hep C Screening:  STD testing and prevention (HIV/chl/gon/syphilis):  Intimate partner violence: {Desc; negative/positive:13464} screen  Sexual History : Menstrual History/LMP/Abnormal Bleeding:  Discussed importance of follow up if any post-menopausal bleeding: {Response; yes/no/na:63}  Incontinence Symptoms: {Desc; negative/positive:13464} for symptoms   Breast cancer:  - Last Mammogram: *** - BRCA gene screening:   Osteoporosis Prevention : Discussed high calcium and vitamin D supplementation,  weight bearing exercises Bone density :{Response; yes/no/na:63}   Cervical cancer screening:   Skin cancer: Discussed monitoring for atypical lesions  Colorectal cancer: ***   Lung cancer:  Low Dose CT Chest recommended if Age 22-80 years, 20 pack-year currently smoking OR have quit w/in 15years. Patient {DOES NOT does:27190::"does not"} qualify for screen   ECG: ***  Advanced Care Planning: A voluntary discussion about advance care planning including the explanation and discussion of advance directives.  Discussed health care proxy and Living will, and the patient was able to identify a health care proxy as ***.  Patient {DOES_DOES NWG:95621} have a living will and power of attorney of health care   Lipids: No results found for: "CHOL" No results found for: "HDL" No results found for: "LDLCALC" No results found for: "TRIG" No results found for: "CHOLHDL" No results found for: "LDLDIRECT"  Glucose: Glucose  Date Value Ref Range Status  07/25/2021 92 70 - 99 mg/dL Final  30/86/5784 87 65 - 99 mg/dL Final   Glucose Tolerance, Fasting  Date Value Ref Range Status  04/17/2011 71 70 - 104 mg/dL Final   Glucose, Bld  Date Value Ref Range Status  09/23/2021 113 (H) 70 - 99 mg/dL Final    Comment:    Glucose reference range applies only to  samples taken after fasting for at least 8 hours.  06/23/2021 88 70 - 99 mg/dL Final    Comment:    Glucose reference range applies only to samples taken after fasting for at least 8 hours.  06/10/2021 123 (H) 70 - 99 mg/dL Final    Comment:    Glucose reference range applies only to samples taken after fasting for at least 8 hours.    Patient Active Problem List   Diagnosis Date Noted   Mild episode of recurrent major depressive disorder (HCC) 06/08/2022   Precipitous delivery 09/15/2021   Postpartum care following vaginal delivery 09/15/2021   Supervision of elderly multigravida, antepartum 07/25/2021   Rh negative state in antepartum  period 07/25/2021   Abdominal pain during pregnancy in second trimester 06/10/2021   Alcohol abuse    Cocaine use complicating pregnancy    Marijuana abuse     Past Surgical History:  Procedure Laterality Date   denies surgical history     NO PAST SURGERIES      Family History  Problem Relation Age of Onset   Hepatitis C Mother    COPD Mother    Arthritis Mother    Hepatitis C Father    Cancer Father    Healthy Brother    Asthma Daughter    Healthy Daughter    Healthy Daughter    Healthy Daughter    Healthy Daughter    Healthy Daughter    Healthy Daughter    Hearing loss Son    Healthy Son    Healthy Son    Dementia Maternal Grandmother    Heart attack Maternal Grandfather    Anesthesia problems Neg Hx    Alcohol abuse Neg Hx     Social History   Socioeconomic History   Marital status: Single    Spouse name: Not on file   Number of children: 11   Years of education: 7   Highest education level: 7th grade  Occupational History   Occupation: unemployed  Tobacco Use   Smoking status: Former    Packs/day: 0.10    Years: 14.00    Additional pack years: 0.00    Total pack years: 1.40    Types: Cigarettes    Quit date: 01/28/2022    Years since quitting: 0.5    Passive exposure: Past   Smokeless tobacco: Never   Tobacco comments:    currently smokes 3 cigarettes per week  Vaping Use   Vaping Use: Never used  Substance and Sexual Activity   Alcohol use: Not Currently    Comment: Last drank [place] iced teas 09/22/2021   Drug use: Not Currently    Types: Marijuana, Cocaine    Comment: Last cocaine use 05/2021 and marijuana use 07/2021.   Sexual activity: Yes    Birth control/protection: None  Other Topics Concern   Not on file  Social History Narrative   Lives with  6 of her children.   Social Determinants of Health   Financial Resource Strain: Low Risk  (07/25/2021)   Overall Financial Resource Strain (CARDIA)    Difficulty of Paying Living  Expenses: Not hard at all  Food Insecurity: No Food Insecurity (07/25/2021)   Hunger Vital Sign    Worried About Running Out of Food in the Last Year: Never true    Ran Out of Food in the Last Year: Never true  Transportation Needs: Unmet Transportation Needs (07/25/2021)   PRAPARE - Administrator, Civil Service (Medical):  Yes    Lack of Transportation (Non-Medical): Yes  Physical Activity: Not on file  Stress: Not on file  Social Connections: Not on file  Intimate Partner Violence: Not At Risk (07/25/2021)   Humiliation, Afraid, Rape, and Kick questionnaire    Fear of Current or Ex-Partner: No    Emotionally Abused: No    Physically Abused: No    Sexually Abused: No     Current Outpatient Medications:    cyclobenzaprine (FLEXERIL) 10 MG tablet, Take 1 tablet (10 mg total) by mouth 3 (three) times daily as needed for muscle spasms. (Patient not taking: Reported on 08/22/2022), Disp: 60 tablet, Rfl: 0   escitalopram (LEXAPRO) 10 MG tablet, Take 10 mg by mouth daily. (Patient not taking: Reported on 08/22/2022), Disp: , Rfl:    hydrOXYzine (ATARAX) 50 MG tablet, Take 50 mg by mouth at bedtime. (Patient not taking: Reported on 08/22/2022), Disp: , Rfl:    medroxyPROGESTERone (DEPO-PROVERA) 150 MG/ML injection, Inject 1 mL (150 mg total) into the muscle every 3 (three) months., Disp: 1 mL, Rfl: 3   naproxen (NAPROSYN) 500 MG tablet, TAKE 1 TABLET BY MOUTH TWICE DAILY WITH A MEAL (Patient not taking: Reported on 08/22/2022), Disp: 60 tablet, Rfl: 0   predniSONE (STERAPRED UNI-PAK 21 TAB) 10 MG (21) TBPK tablet, Take as directed on package.  (60 mg po on day 1, 50 mg po on day 2...) (Patient not taking: Reported on 08/22/2022), Disp: 21 tablet, Rfl: 0  Current Facility-Administered Medications:    medroxyPROGESTERone Acetate SUSY 150 mg, 150 mg, Intramuscular, Q90 days, Berniece Salines, FNP, 150 mg at 06/08/22 1229  No Known Allergies   ROS  ***  Objective  There were no vitals  filed for this visit.  There is no height or weight on file to calculate BMI.  Physical Exam ***  Recent Results (from the past 2160 hour(s))  POCT urine pregnancy     Status: None   Collection Time: 06/08/22 10:28 AM  Result Value Ref Range   Preg Test, Ur Negative Negative  POCT urinalysis dipstick     Status: Abnormal   Collection Time: 08/22/22 10:40 AM  Result Value Ref Range   Color, UA gold    Clarity, UA cloudy    Glucose, UA Negative Negative   Bilirubin, UA negative    Ketones, UA negative    Spec Grav, UA 1.020 1.010 - 1.025   Blood, UA large    pH, UA 6.0 5.0 - 8.0   Protein, UA Positive (A) Negative   Urobilinogen, UA 0.2 0.2 or 1.0 E.U./dL   Nitrite, UA negative    Leukocytes, UA Moderate (2+) (A) Negative   Appearance cloudy    Odor none   POCT urine pregnancy     Status: None   Collection Time: 08/22/22 10:42 AM  Result Value Ref Range   Preg Test, Ur Negative Negative  Cervicovaginal ancillary only     Status: None   Collection Time: 08/22/22 10:57 AM  Result Value Ref Range   Chlamydia Other    Neisseria Gonorrhea Other    Bacterial Vaginitis (gardnerella) Other    Trichomonas Other    Candida Vaginitis Other    Candida Glabrata Other    Molecular Comment      Quantity Not Sufficient. Insufficient material to adequately perform   Molecular Comment test.    Molecular Comment Recommend recollection and testing.    Comment Normal Reference Ranger Chlamydia - Negative    Comment  Normal Reference Range Neisseria Gonorrhea - Negative   Comment Normal Reference Range Trichomonas - Negative    Comment      Normal Reference Range Bacterial Vaginosis - Negative   Comment Normal Reference Range Candida Species - Negative    Comment Normal Reference Range Candida Galbrata - Negative   HIV antibody (with reflex)     Status: None   Collection Time: 08/22/22 11:40 AM  Result Value Ref Range   HIV 1&2 Ab, 4th Generation NON-REACTIVE NON-REACTIVE     Comment: HIV-1 antigen and HIV-1/HIV-2 antibodies were not detected. There is no laboratory evidence of HIV infection. Marland Kitchen PLEASE NOTE: This information has been disclosed to you from records whose confidentiality may be protected by state law.  If your state requires such protection, then the state law prohibits you from making any further disclosure of the information without the specific written consent of the person to whom it pertains, or as otherwise permitted by law. A general authorization for the release of medical or other information is NOT sufficient for this purpose. . For additional information please refer to http://education.[facility].com/faq/FAQ106 (This link is being provided for informational/ educational purposes only.) . Marland Kitchen The performance of this assay has not been clinically validated in patients less than 66 years old. .   RPR     Status: None   Collection Time: 08/22/22 11:40 AM  Result Value Ref Range   RPR Ser Ql NON-REACTIVE NON-REACTIVE    Comment: . No laboratory evidence of syphilis. If recent exposure is suspected, submit a new sample in 2-4 weeks. .   Urine Culture     Status: None   Collection Time: 08/22/22 11:40 AM  Result Value Ref Range   MICRO NUMBER: [id number]    SPECIMEN QUALITY: Adequate    Sample Source URINE    STATUS: FINAL    Result:      Less than 10,000 CFU/mL of single Gram positive organism isolated. No further testing will be performed. If clinically indicated, recollection using a method to minimize contamination, with prompt transfer to Urine Culture Transport Tube, is recommended.     Fall Risk:    08/22/2022   10:29 AM 06/08/2022    9:50 AM 08/31/2014    2:59 PM  Fall Risk   Falls in the past year? 0 0 No  Number falls in past yr: 0 0   Injury with Fall? 0 0    ***  Functional Status Survey:   ***  Assessment & Plan  There are no diagnoses linked to this encounter.  -USPSTF grade A and B  recommendations reviewed with patient; age-appropriate recommendations, preventive care, screening tests, etc discussed and encouraged; healthy living encouraged; see AVS for patient education given to patient -Discussed importance of 150 minutes of physical activity weekly, eat two servings of fish weekly, eat one serving of tree nuts ( cashews, pistachios, pecans, almonds.Marland Kitchen) every other day, eat 6 servings of fruit/vegetables daily and drink plenty of water and avoid sweet beverages.   -Reviewed Health Maintenance: {yes QI:[id number]}

## 2022-08-31 ENCOUNTER — Encounter: Payer: Medicaid Other | Admitting: Nurse Practitioner

## 2022-09-08 ENCOUNTER — Ambulatory Visit: Payer: Medicaid Other

## 2022-09-21 NOTE — Progress Notes (Unsigned)
Name: Tonya Nichols   MRN: [id number]    DOB: May 25, 1985   Date:09/25/2022       Progress Note  Subjective  Chief Complaint  Chief Complaint  Patient presents with   Annual Exam    HPI  Patient presents for annual CPE.  Diet: she is eating a lot of fast food for now Exercise: No, encouraged physical activity Last Eye Exam: 15 years Last Dental Exam: 2017  Flowsheet Row Office Visit from 09/25/2022 in [facility]  AUDIT-C Score 0      Depression: Phq 9 is  negative    09/25/2022   10:48 AM 08/22/2022   10:29 AM 07/25/2021    1:31 PM 04/21/2021    9:41 AM 08/20/2019    2:46 PM  Depression screen PHQ 2/9  Decreased Interest 0 0 0 0 0  Down, Depressed, Hopeless 0 3 0 0 0  PHQ - 2 Score 0 3 0 0 0  Altered sleeping 1 3 3   0  Tired, decreased energy 1 3 0  0  Change in appetite 0 1 0  0  Feeling bad or failure about yourself  0 3 0  0  Trouble concentrating 0 2 0  0  Moving slowly or fidgety/restless 0 1 0  0  Suicidal thoughts 0 0 0  0  PHQ-9 Score 2 16 3   0  Difficult doing work/chores Not difficult at all Somewhat difficult Somewhat difficult     Hypertension: BP Readings from Last 3 Encounters:  09/25/22 120/80  08/22/22 124/72  06/08/22 122/74   Obesity: Wt Readings from Last 3 Encounters:  09/25/22 208 lb 3.2 oz (94.4 kg)  08/22/22 200 lb 1.6 oz (90.8 kg)  06/08/22 204 lb 8 oz (92.8 kg)   BMI Readings from Last 3 Encounters:  09/25/22 36.88 kg/m  08/22/22 33.30 kg/m  06/08/22 36.23 kg/m     Vaccines:  HPV: up to at age 40 , ask insurance if age between 20-45  Shingrix: 59-64 yo and ask insurance if covered when patient above 95 yo Pneumonia:  educated and discussed with patient. Flu:  educated and discussed with patient.     Hep C Screening: completed STD testing and prevention (HIV/chl/gon/syphilis): completed Intimate partner violence: negative screen  Sexual History : yes, on Depo Menstrual History/LMP/Abnormal  Bleeding: mild bleeding since starting depo Discussed importance of follow up if any post-menopausal bleeding: no  Incontinence Symptoms: positive for symptoms   Breast cancer:  - Last Mammogram: NA - BRCA gene screening: none  Osteoporosis Prevention : Discussed high calcium and vitamin D supplementation, weight bearing exercises Bone density :no   Cervical cancer screening: due today  Skin cancer: Discussed monitoring for atypical lesions  Colorectal cancer: NA   Lung cancer:  Low Dose CT Chest recommended if Age 72-80 years, 20 pack-year currently smoking OR have quit w/in 15years. Patient does not qualify for screen   ECG: none  Advanced Care Planning: A voluntary discussion about advance care planning including the explanation and discussion of advance directives.  Discussed health care proxy and Living will, and the patient was able to identify a health care proxy as angela Cheaney.  Patient does not have a living will and power of attorney of health care   Lipids: No results found for: "CHOL" No results found for: "HDL" No results found for: "LDLCALC" No results found for: "TRIG" No results found for: "CHOLHDL" No results found for: "LDLDIRECT"  Glucose: Glucose  Date  Value Ref Range Status  07/25/2021 92 70 - 99 mg/dL Final  12/31/2534 87 65 - 99 mg/dL Final   Glucose Tolerance, Fasting  Date Value Ref Range Status  04/17/2011 71 70 - 104 mg/dL Final   Glucose, Bld  Date Value Ref Range Status  09/23/2021 113 (H) 70 - 99 mg/dL Final    Comment:    Glucose reference range applies only to samples taken after fasting for at least 8 hours.  06/23/2021 88 70 - 99 mg/dL Final    Comment:    Glucose reference range applies only to samples taken after fasting for at least 8 hours.  06/10/2021 123 (H) 70 - 99 mg/dL Final    Comment:    Glucose reference range applies only to samples taken after fasting for at least 8 hours.    Patient Active Problem List   Diagnosis  Date Noted   Mild episode of recurrent major depressive disorder (HCC) 06/08/2022   Precipitous delivery 09/15/2021   Postpartum care following vaginal delivery 09/15/2021   Supervision of elderly multigravida, antepartum 07/25/2021   Rh negative state in antepartum period 07/25/2021   Abdominal pain during pregnancy in second trimester 06/10/2021   Alcohol abuse    Cocaine use complicating pregnancy    Marijuana abuse     Past Surgical History:  Procedure Laterality Date   denies surgical history     NO PAST SURGERIES      Family History  Problem Relation Age of Onset   Hepatitis C Mother    COPD Mother    Arthritis Mother    Hepatitis C Father    Cancer Father    Healthy Brother    Asthma Daughter    Healthy Daughter    Healthy Daughter    Healthy Daughter    Healthy Daughter    Healthy Daughter    Healthy Daughter    Hearing loss Son    Healthy Son    Healthy Son    Dementia Maternal Grandmother    Heart attack Maternal Grandfather    Anesthesia problems Neg Hx    Alcohol abuse Neg Hx     Social History   Socioeconomic History   Marital status: Single    Spouse name: Not on file   Number of children: 11   Years of education: 7   Highest education level: 7th grade  Occupational History   Occupation: unemployed  Tobacco Use   Smoking status: Former    Current packs/day: 0.00    Average packs/day: 0.1 packs/day for 14.0 years (1.4 ttl pk-yrs)    Types: Cigarettes    Start date: 01/29/2008    Quit date: 01/28/2022    Years since quitting: 0.6    Passive exposure: Past   Smokeless tobacco: Never   Tobacco comments:    currently smokes 3 cigarettes per week  Vaping Use   Vaping status: Never Used  Substance and Sexual Activity   Alcohol use: Not Currently    Comment: Last drank [place] iced teas 09/22/2021   Drug use: Not Currently    Types: Marijuana, Cocaine    Comment: Last cocaine use 05/2021 and marijuana use 07/2021.   Sexual activity: Yes     Birth control/protection: None  Other Topics Concern   Not on file  Social History Narrative   Lives with  6 of her children.   Social Determinants of Health   Financial Resource Strain: Low Risk  (07/25/2021)   Overall Physicist, medical Strain (  CARDIA)    Difficulty of Paying Living Expenses: Not hard at all  Food Insecurity: No Food Insecurity (09/25/2022)   Hunger Vital Sign    Worried About Running Out of Food in the Last Year: Never true    Ran Out of Food in the Last Year: Never true  Transportation Needs: Unmet Transportation Needs (09/25/2022)   PRAPARE - Transportation    Lack of Transportation (Medical): Yes    Lack of Transportation (Non-Medical): Yes  Physical Activity: Inactive (09/25/2022)   Exercise Vital Sign    Days of Exercise per Week: 0 days    Minutes of Exercise per Session: 0 min  Stress: No Stress Concern Present (09/25/2022)   [employer] of Occupational Health - Occupational Stress Questionnaire    Feeling of Stress : Only a little  Social Connections: Moderately Integrated (09/25/2022)   Social Connection and Isolation Panel [NHANES]    Frequency of Communication with Friends and Family: More than three times a week    Frequency of Social Gatherings with Friends and Family: More than three times a week    Attends Religious Services: More than 4 times per year    Active Member of [employer] or Organizations: Yes    Attends Banker Meetings: More than 4 times per year    Marital Status: Never married  Intimate Partner Violence: Not At Risk (09/25/2022)   Humiliation, Afraid, Rape, and Kick questionnaire    Fear of Current or Ex-Partner: No    Emotionally Abused: No    Physically Abused: No    Sexually Abused: No     Current Outpatient Medications:    buPROPion (WELLBUTRIN XL) 150 MG 24 hr tablet, Take 150 mg by mouth every morning., Disp: , Rfl:    cloNIDine (CATAPRES) 0.1 MG tablet, Take 0.1 mg by mouth 2 (two) times daily., Disp: ,  Rfl:    medroxyPROGESTERone (DEPO-PROVERA) 150 MG/ML injection, Inject 1 mL (150 mg total) into the muscle every 3 (three) months., Disp: 1 mL, Rfl: 3   traZODone (DESYREL) 100 MG tablet, Take 100 mg by mouth at bedtime., Disp: , Rfl:    cyclobenzaprine (FLEXERIL) 10 MG tablet, Take 1 tablet (10 mg total) by mouth 3 (three) times daily as needed for muscle spasms. (Patient not taking: Reported on 08/22/2022), Disp: 60 tablet, Rfl: 0   hydrOXYzine (ATARAX) 50 MG tablet, Take 50 mg by mouth at bedtime. (Patient not taking: Reported on 08/22/2022), Disp: , Rfl:    naproxen (NAPROSYN) 500 MG tablet, TAKE 1 TABLET BY MOUTH TWICE DAILY WITH A MEAL (Patient not taking: Reported on 08/22/2022), Disp: 60 tablet, Rfl: 0   predniSONE (STERAPRED UNI-PAK 21 TAB) 10 MG (21) TBPK tablet, Take as directed on package.  (60 mg po on day 1, 50 mg po on day 2...) (Patient not taking: Reported on 08/22/2022), Disp: 21 tablet, Rfl: 0  Current Facility-Administered Medications:    medroxyPROGESTERone Acetate SUSY 150 mg, 150 mg, Intramuscular, Q90 days, Berniece Salines, FNP, 150 mg at 06/08/22 1229  No Known Allergies   ROS  Constitutional: Negative for fever or weight change.  Respiratory: Negative for cough and shortness of breath.   Cardiovascular: Negative for chest pain or palpitations.  Gastrointestinal: Negative for abdominal pain, no bowel changes.  Musculoskeletal: Negative for gait problem or joint swelling.  Skin: Negative for rash.  Neurological: Negative for dizziness or headache.  No other specific complaints in a complete review of systems (except as listed in HPI above).  Objective  Vitals:   09/25/22 1051  BP: 120/80  Pulse: 93  Resp: 18  Temp: (!) 97.5 F (36.4 C)  TempSrc: Oral  SpO2: 98%  Weight: 208 lb 3.2 oz (94.4 kg)  Height: 5\' 3"  (1.6 m)    Body mass index is 36.88 kg/m.  Physical Exam  Constitutional: Patient appears well-developed and well-nourished. No distress.  HENT:  Head: Normocephalic and atraumatic. Ears: B TMs ok, no erythema or effusion; Nose: Nose normal. Mouth/Throat: Oropharynx is clear and moist. No oropharyngeal exudate.  Eyes: Conjunctivae and EOM are normal. Pupils are equal, round, and reactive to light. No scleral icterus.  Neck: Normal range of motion. Neck supple. No JVD present. No thyromegaly present.  Cardiovascular: Normal rate, regular rhythm and normal heart sounds.  No murmur heard. No BLE edema. Pulmonary/Chest: Effort normal and breath sounds normal. No respiratory distress. Abdominal: Soft. Bowel sounds are normal, no distension. There is no tenderness. no masses Breast: no lumps or masses, no nipple discharge or rashes FEMALE GENITALIA:  External genitalia normal External urethra normal Vaginal vault normal without discharge or lesions Cervix normal without discharge or lesions Bimanual exam normal without masses RECTAL: no rectal masses or hemorrhoids Musculoskeletal: Normal range of motion, no joint effusions. No gross deformities Neurological: he is alert and oriented to person, place, and time. No cranial nerve deficit. Coordination, balance, strength, speech and gait are normal.  Skin: Skin is warm and dry. No rash noted. No erythema.  Psychiatric: Patient has a normal mood and affect. behavior is normal. Judgment and thought content normal.   Recent Results (from the past 2160 hour(s))  POCT urinalysis dipstick     Status: Abnormal   Collection Time: 08/22/22 10:40 AM  Result Value Ref Range   Color, UA gold    Clarity, UA cloudy    Glucose, UA Negative Negative   Bilirubin, UA negative    Ketones, UA negative    Spec Grav, UA 1.020 1.010 - 1.025   Blood, UA large    pH, UA 6.0 5.0 - 8.0   Protein, UA Positive (A) Negative   Urobilinogen, UA 0.2 0.2 or 1.0 E.U./dL   Nitrite, UA negative    Leukocytes, UA Moderate (2+) (A) Negative   Appearance cloudy    Odor none   POCT urine pregnancy     Status: None    Collection Time: 08/22/22 10:42 AM  Result Value Ref Range   Preg Test, Ur Negative Negative  Cervicovaginal ancillary only     Status: None   Collection Time: 08/22/22 10:57 AM  Result Value Ref Range   Chlamydia Other    Neisseria Gonorrhea Other    Bacterial Vaginitis (gardnerella) Other    Trichomonas Other    Candida Vaginitis Other    Candida Glabrata Other    Molecular Comment      Quantity Not Sufficient. Insufficient material to adequately perform   Molecular Comment test.    Molecular Comment Recommend recollection and testing.    Comment Normal Reference Ranger Chlamydia - Negative    Comment      Normal Reference Range Neisseria Gonorrhea - Negative   Comment Normal Reference Range Trichomonas - Negative    Comment      Normal Reference Range Bacterial Vaginosis - Negative   Comment Normal Reference Range Candida Species - Negative    Comment Normal Reference Range Candida Galbrata - Negative   HIV antibody (with reflex)     Status: None   Collection Time:  08/22/22 11:40 AM  Result Value Ref Range   HIV 1&2 Ab, 4th Generation NON-REACTIVE NON-REACTIVE    Comment: HIV-1 antigen and HIV-1/HIV-2 antibodies were not detected. There is no laboratory evidence of HIV infection. Marland Kitchen PLEASE NOTE: This information has been disclosed to you from records whose confidentiality may be protected by state law.  If your state requires such protection, then the state law prohibits you from making any further disclosure of the information without the specific written consent of the person to whom it pertains, or as otherwise permitted by law. A general authorization for the release of medical or other information is NOT sufficient for this purpose. . For additional information please refer to http://education.questdiagnostics.com/faq/FAQ106 (This link is being provided for informational/ educational purposes only.) . Marland Kitchen The performance of this assay has not been  clinically validated in patients less than 37 years old. .   RPR     Status: None   Collection Time: 08/22/22 11:40 AM  Result Value Ref Range   RPR Ser Ql NON-REACTIVE NON-REACTIVE    Comment: . No laboratory evidence of syphilis. If recent exposure is suspected, submit a new sample in 2-4 weeks. .   Urine Culture     Status: None   Collection Time: 08/22/22 11:40 AM  Result Value Ref Range   MICRO NUMBER: [id number]    SPECIMEN QUALITY: Adequate    Sample Source URINE    STATUS: FINAL    Result:      Less than 10,000 CFU/mL of single Gram positive organism isolated. No further testing will be performed. If clinically indicated, recollection using a method to minimize contamination, with prompt transfer to Urine Culture Transport Tube, is recommended.     Fall Risk:    09/25/2022   10:47 AM 08/22/2022   10:29 AM 06/08/2022    9:50 AM 08/31/2014    2:59 PM  Fall Risk   Falls in the past year? 0 0 0 No  Number falls in past yr: 0 0 0   Injury with Fall? 0 0 0      Functional Status Survey: Is the patient deaf or have difficulty hearing?: No Does the patient have difficulty seeing, even when wearing glasses/contacts?: No Does the patient have difficulty concentrating, remembering, or making decisions?: No Does the patient have difficulty walking or climbing stairs?: No Does the patient have difficulty dressing or bathing?: No Does the patient have difficulty doing errands alone such as visiting a doctor's office or shopping?: No   Assessment & Plan  1. Annual physical exam  - CBC with Differential/Platelet - COMPLETE METABOLIC PANEL WITH GFR - Lipid panel - Hemoglobin A1c - Cytology - PAP  2. Screening for diabetes mellitus  - COMPLETE METABOLIC PANEL WITH GFR - Hemoglobin A1c  3. Screening for deficiency anemia  - CBC with Differential/Platelet  4. Screening for cholesterol level  - Lipid panel  5. Screening for cervical cancer  - Cytology - PAP    -USPSTF grade A and B recommendations reviewed with patient; age-appropriate recommendations, preventive care, screening tests, etc discussed and encouraged; healthy living encouraged; see AVS for patient education given to patient -Discussed importance of 150 minutes of physical activity weekly, eat two servings of fish weekly, eat one serving of tree nuts ( cashews, pistachios, pecans, almonds.Marland Kitchen) every other day, eat 6 servings of fruit/vegetables daily and drink plenty of water and avoid sweet beverages.   -Reviewed Health Maintenance: Yes.

## 2022-09-21 NOTE — Progress Notes (Deleted)
.  ks

## 2022-09-25 ENCOUNTER — Encounter: Payer: Self-pay | Admitting: Nurse Practitioner

## 2022-09-25 ENCOUNTER — Other Ambulatory Visit: Payer: Self-pay

## 2022-09-25 ENCOUNTER — Other Ambulatory Visit (HOSPITAL_COMMUNITY)
Admission: RE | Admit: 2022-09-25 | Discharge: 2022-09-25 | Disposition: A | Discharge status: Home / Self Care | Payer: Medicaid Other | Source: Ambulatory Visit | Attending: Nurse Practitioner | Admitting: Nurse Practitioner

## 2022-09-25 ENCOUNTER — Ambulatory Visit (INDEPENDENT_AMBULATORY_CARE_PROVIDER_SITE_OTHER): Payer: Medicaid Other | Admitting: Nurse Practitioner

## 2022-09-25 VITALS — BP 120/80 | HR 93 | Temp 97.5°F | Resp 18 | Ht 63.0 in | Wt 208.2 lb

## 2022-09-25 DIAGNOSIS — Z0000 Encounter for general adult medical examination without abnormal findings: Secondary | ICD-10-CM | POA: Diagnosis not present

## 2022-09-25 DIAGNOSIS — Z13220 Encounter for screening for lipoid disorders: Secondary | ICD-10-CM | POA: Diagnosis not present

## 2022-09-25 DIAGNOSIS — Z124 Encounter for screening for malignant neoplasm of cervix: Secondary | ICD-10-CM | POA: Insufficient documentation

## 2022-09-25 DIAGNOSIS — Z130 Encounter for screening for diseases of the blood and blood-forming organs and certain disorders involving the immune mechanism: Secondary | ICD-10-CM | POA: Diagnosis not present

## 2022-09-25 DIAGNOSIS — Z131 Encounter for screening for diabetes mellitus: Secondary | ICD-10-CM | POA: Diagnosis not present

## 2022-09-25 DIAGNOSIS — N3941 Urge incontinence: Secondary | ICD-10-CM

## 2022-09-25 LAB — CBC WITH DIFFERENTIAL/PLATELET
Absolute Monocytes: 639 cells/uL (ref 200–950)
Eosinophils Relative: 1.7 %
MCH: 27.9 pg (ref 27.0–33.0)
MCHC: 33.1 g/dL (ref 32.0–36.0)
MPV: 12.8 fL — ABNORMAL HIGH (ref 7.5–12.5)
Monocytes Relative: 8.3 %
Neutrophils Relative %: 60.9 %
Platelets: 227 10*3/uL (ref 140–400)
RBC: 4.51 10*6/uL (ref 3.80–5.10)
RDW: 13.9 % (ref 11.0–15.0)
Total Lymphocyte: 28.4 %
WBC: 7.7 10*3/uL (ref 3.8–10.8)

## 2022-09-26 ENCOUNTER — Ambulatory Visit: Payer: Medicaid Other

## 2022-09-26 DIAGNOSIS — Z3042 Encounter for surveillance of injectable contraceptive: Secondary | ICD-10-CM

## 2022-09-26 LAB — COMPLETE METABOLIC PANEL WITH GFR
AG Ratio: 1.5 (calc) (ref 1.0–2.5)
ALT: 10 U/L (ref 6–29)
AST: 11 U/L (ref 10–30)
Albumin: 3.7 g/dL (ref 3.6–5.1)
Alkaline phosphatase (APISO): 65 U/L (ref 31–125)
BUN: 12 mg/dL (ref 7–25)
CO2: 21 mmol/L (ref 20–32)
Calcium: 9 mg/dL (ref 8.6–10.2)
Chloride: 107 mmol/L (ref 98–110)
Creat: 0.85 mg/dL (ref 0.50–0.97)
Globulin: 2.5 g/dL (calc) (ref 1.9–3.7)
Glucose, Bld: 108 mg/dL — ABNORMAL HIGH (ref 65–99)
Potassium: 4.1 mmol/L (ref 3.5–5.3)
Sodium: 138 mmol/L (ref 135–146)
Total Bilirubin: 0.3 mg/dL (ref 0.2–1.2)
Total Protein: 6.2 g/dL (ref 6.1–8.1)
eGFR: 89 mL/min/{1.73_m2} (ref >=60)

## 2022-09-26 LAB — LIPID PANEL
Cholesterol: 148 mg/dL (ref <200)
HDL: 49 mg/dL — ABNORMAL LOW (ref >=50)
LDL Cholesterol (Calc): 79 mg/dL (calc)
Non-HDL Cholesterol (Calc): 99 mg/dL (calc) (ref <130)
Total CHOL/HDL Ratio: 3 (calc) (ref <5.0)
Triglycerides: 110 mg/dL (ref <150)

## 2022-09-26 LAB — CBC WITH DIFFERENTIAL/PLATELET
Basophils Absolute: 54 cells/uL (ref 0–200)
Basophils Relative: 0.7 %
Eosinophils Absolute: 131 cells/uL (ref 15–500)
HCT: 38.1 % (ref 35.0–45.0)
Hemoglobin: 12.6 g/dL (ref 11.7–15.5)
Lymphs Abs: 2187 cells/uL (ref 850–3900)
MCV: 84.5 fL (ref 80.0–100.0)
Neutro Abs: 4689 cells/uL (ref 1500–7800)

## 2022-09-26 LAB — HEMOGLOBIN A1C
Hgb A1c MFr Bld: 5.4 % of total Hgb (ref <5.7)
Mean Plasma Glucose: 108 mg/dL
eAG (mmol/L): 6 mmol/L

## 2022-09-26 LAB — POCT URINE PREGNANCY: Preg Test, Ur: NEGATIVE

## 2022-09-26 MED ORDER — MEDROXYPROGESTERONE ACETATE 150 MG/ML IM SUSY
PREFILLED_SYRINGE | Freq: Once | INTRAMUSCULAR | Status: AC
Start: 2022-09-26 — End: 2022-09-26

## 2022-09-27 ENCOUNTER — Telehealth: Payer: Self-pay

## 2022-09-27 LAB — CYTOLOGY - PAP
Chlamydia: NEGATIVE
Comment: NEGATIVE
Comment: NEGATIVE
Comment: NORMAL
Diagnosis: NEGATIVE
Diagnosis: REACTIVE
High risk HPV: NEGATIVE
Neisseria Gonorrhea: NEGATIVE

## 2022-09-27 NOTE — Telephone Encounter (Signed)
Pt given lab results per notes of J. Pender NP on 09/27/22. Pt verbalized understanding.

## 2022-09-27 NOTE — Progress Notes (Signed)
NA

## 2022-10-23 NOTE — Progress Notes (Unsigned)
   Acute Office Visit  Subjective:     Patient ID: Tonya Nichols, female    DOB: 09-29-1985, 37 y.o.   MRN: [id number]  No chief complaint on file.   HPI Patient is in today for nausea, vomiting and diarrhea.   Abdominal Complaint:  -Duration: {Blank single:19197::"chronic","days","weeks","months"} -Frequency: {Blank single:19197::"constant","intermittent","occasional","rare","every few minutes","a few times a hour","a few times a day","a few times a week","a few times a month","a few times a year"} -Nature: {Blank multiple:19196::"bloating","sharp","dull","aching","burning","cramping","ill-defined","itchy","pressure-like","pulling","shooting","sore","stabbing","tender","tearing","throbbing"} -Location: {Blank multiple:19196::"diffuse","vague","LUQ","RUQ","epigastric","peri-umbilical","LLQ","RLQ","diffuse","suprapubic". "lower abdominal quadrants"}  -Severity: {Blank single:19197::"mild","moderate","severe","1/10","2/10","3/10","4/10","5/10","6/10","7/10","8/10","9/10","10/10"}  -Radiation: {Blank single:19197::"yes","no"} -Alleviating factors: *** -Aggravating factors: *** -Treatments attempted: {Blank multiple:19196::"none","antacids","PPI","H2 Blocker","laxatives"} -Constipation: {Blank single:19197::"yes","no","intermittent"} -Diarrhea: {Blank single:19197::"yes","no"} -Episodes of diarrhea/day: -Mucous in the stool: {Blank single:19197::"yes","no"} -Heartburn: {Blank single:19197::"yes","no"} -Bloating:{Blank single:19197::"yes","no"} -Passing Gas: {Blank single:19197::"yes","no"} -Nausea: {Blank single:19197::"yes","no"} -Vomiting: {Blank single:19197::"yes","no"} -Episodes of vomit/day: -Melena or hematochezia: {Blank single:19197::"yes","no"} -Rash: {Blank single:19197::"yes","no"} -Jaundice: {Blank single:19197::"yes","no"} -Fever: {Blank single:19197::"yes","no"} -Weight loss: {Blank single:19197::"yes","no"} -Change in Appetite: {Blank  single:19197::"yes","no"}   ROS      Objective:    There were no vitals taken for this visit. {Vitals History (Optional):23777}  Physical Exam  No results found for any visits on 10/24/22.      Assessment & Plan:   Problem List Items Addressed This Visit   None   No orders of the defined types were placed in this encounter.   No follow-ups on file.  Margarita Mail, DO

## 2022-10-24 ENCOUNTER — Ambulatory Visit (INDEPENDENT_AMBULATORY_CARE_PROVIDER_SITE_OTHER): Payer: MEDICAID | Admitting: Internal Medicine

## 2022-10-24 ENCOUNTER — Encounter: Payer: Self-pay | Admitting: Internal Medicine

## 2022-10-24 VITALS — BP 124/76 | HR 92 | Temp 97.8°F | Ht 63.0 in | Wt 203.0 lb

## 2022-10-24 DIAGNOSIS — R051 Acute cough: Secondary | ICD-10-CM | POA: Diagnosis not present

## 2022-10-24 DIAGNOSIS — J069 Acute upper respiratory infection, unspecified: Secondary | ICD-10-CM | POA: Diagnosis not present

## 2022-10-24 MED ORDER — ONDANSETRON HCL 4 MG PO TABS: 4.0000 mg | ORAL_TABLET | Freq: Three times a day (TID) | ORAL | 0 refills | Status: DC | PRN

## 2022-10-24 MED ORDER — BENZONATATE 100 MG PO CAPS
100.0000 mg | ORAL_CAPSULE | Freq: Two times a day (BID) | ORAL | 0 refills | Status: DC | PRN
Start: 2022-10-24 — End: 2023-06-06

## 2022-10-24 MED ORDER — FLUTICASONE PROPIONATE 50 MCG/ACT NA SUSP: 2.0000 | Freq: Every day | NASAL | 6 refills | Status: DC

## 2022-10-25 LAB — NOVEL CORONAVIRUS, NAA: SARS-CoV-2, NAA: NOT DETECTED

## 2022-11-28 ENCOUNTER — Emergency Department
Admission: EM | Admit: 2022-11-28 | Discharge: 2022-11-28 | Disposition: A | Discharge status: Other Acute Inpatient Hospital | Payer: MEDICAID | Attending: Emergency Medicine | Admitting: Emergency Medicine

## 2022-11-28 ENCOUNTER — Encounter: Payer: Self-pay | Admitting: Emergency Medicine

## 2022-11-28 ENCOUNTER — Emergency Department: Payer: MEDICAID

## 2022-11-28 ENCOUNTER — Other Ambulatory Visit: Payer: Self-pay

## 2022-11-28 DIAGNOSIS — F1410 Cocaine abuse, uncomplicated: Secondary | ICD-10-CM | POA: Diagnosis present

## 2022-11-28 DIAGNOSIS — J45909 Unspecified asthma, uncomplicated: Secondary | ICD-10-CM | POA: Insufficient documentation

## 2022-11-28 DIAGNOSIS — F1014 Alcohol abuse with alcohol-induced mood disorder: Secondary | ICD-10-CM | POA: Diagnosis not present

## 2022-11-28 DIAGNOSIS — R000 Tachycardia, unspecified: Secondary | ICD-10-CM | POA: Insufficient documentation

## 2022-11-28 DIAGNOSIS — R079 Chest pain, unspecified: Secondary | ICD-10-CM | POA: Diagnosis present

## 2022-11-28 DIAGNOSIS — Y902 Blood alcohol level of 40-59 mg/100 ml: Secondary | ICD-10-CM | POA: Diagnosis not present

## 2022-11-28 DIAGNOSIS — F1490 Cocaine use, unspecified, uncomplicated: Secondary | ICD-10-CM

## 2022-11-28 DIAGNOSIS — R45851 Suicidal ideations: Secondary | ICD-10-CM | POA: Insufficient documentation

## 2022-11-28 DIAGNOSIS — Z87891 Personal history of nicotine dependence: Secondary | ICD-10-CM | POA: Diagnosis not present

## 2022-11-28 DIAGNOSIS — F10920 Alcohol use, unspecified with intoxication, uncomplicated: Secondary | ICD-10-CM

## 2022-11-28 DIAGNOSIS — R0789 Other chest pain: Secondary | ICD-10-CM | POA: Diagnosis not present

## 2022-11-28 LAB — ETHANOL: Alcohol, Ethyl (B): 52 mg/dL — ABNORMAL HIGH (ref <10)

## 2022-11-28 LAB — COMPREHENSIVE METABOLIC PANEL
ALT: 18 U/L (ref 0–44)
AST: 22 U/L (ref 15–41)
Albumin: 3.9 g/dL (ref 3.5–5.0)
Alkaline Phosphatase: 66 U/L (ref 38–126)
Anion gap: 12 (ref 5–15)
BUN: 9 mg/dL (ref 6–20)
CO2: 19 mmol/L — ABNORMAL LOW (ref 22–32)
Calcium: 8.5 mg/dL — ABNORMAL LOW (ref 8.9–10.3)
Chloride: 109 mmol/L (ref 98–111)
Creatinine, Ser: 0.85 mg/dL (ref 0.44–1.00)
GFR, Estimated: >60 mL/min (ref >60)
Glucose, Bld: 86 mg/dL (ref 70–99)
Potassium: 3.7 mmol/L (ref 3.5–5.1)
Sodium: 140 mmol/L (ref 135–145)
Total Bilirubin: 0.5 mg/dL (ref 0.3–1.2)
Total Protein: 7.3 g/dL (ref 6.5–8.1)

## 2022-11-28 LAB — URINE DRUG SCREEN, QUALITATIVE (ARMC ONLY)
Amphetamines, Ur Screen: NOT DETECTED
Barbiturates, Ur Screen: NOT DETECTED
Benzodiazepine, Ur Scrn: NOT DETECTED
Cannabinoid 50 Ng, Ur ~~LOC~~: POSITIVE — AB
Cocaine Metabolite,Ur ~~LOC~~: POSITIVE — AB
MDMA (Ecstasy)Ur Screen: NOT DETECTED
Methadone Scn, Ur: NOT DETECTED
Opiate, Ur Screen: NOT DETECTED
Phencyclidine (PCP) Ur S: NOT DETECTED
Tricyclic, Ur Screen: POSITIVE — AB

## 2022-11-28 LAB — TROPONIN I (HIGH SENSITIVITY)
Troponin I (High Sensitivity): 5 ng/L (ref <18)
Troponin I (High Sensitivity): 5 ng/L (ref <18)

## 2022-11-28 LAB — CBC
HCT: 40.5 % (ref 36.0–46.0)
Hemoglobin: 13.6 g/dL (ref 12.0–15.0)
MCH: 28.1 pg (ref 26.0–34.0)
MCHC: 33.6 g/dL (ref 30.0–36.0)
MCV: 83.7 fL (ref 80.0–100.0)
Platelets: 269 10*3/uL (ref 150–400)
RBC: 4.84 MIL/uL (ref 3.87–5.11)
RDW: 15.3 % (ref 11.5–15.5)
WBC: 11.1 10*3/uL — ABNORMAL HIGH (ref 4.0–10.5)
nRBC: 0 % (ref 0.0–0.2)

## 2022-11-28 LAB — SALICYLATE LEVEL: Salicylate Lvl: <7 mg/dL — ABNORMAL LOW (ref 7.0–30.0)

## 2022-11-28 LAB — ACETAMINOPHEN LEVEL: Acetaminophen (Tylenol), Serum: <10 ug/mL — ABNORMAL LOW (ref 10–30)

## 2022-11-28 LAB — HCG, QUANTITATIVE, PREGNANCY: hCG, Beta Chain, Quant, S: <1 m[IU]/mL (ref <5)

## 2022-11-28 MED ORDER — FOLIC ACID 1 MG PO TABS
1.0000 mg | ORAL_TABLET | Freq: Every day | ORAL | Status: DC
  Administered 2022-11-28: 1 mg via ORAL
  Filled 2022-11-28: qty 1

## 2022-11-28 MED ORDER — LORAZEPAM 1 MG PO TABS: 1.0000 mg | ORAL_TABLET | ORAL | Status: DC | PRN

## 2022-11-28 MED ORDER — LORAZEPAM 2 MG PO TABS
2.0000 mg | ORAL_TABLET | Freq: Once | ORAL | Status: AC
  Administered 2022-11-28: 2 mg via ORAL
  Filled 2022-11-28: qty 1

## 2022-11-28 MED ORDER — ASPIRIN 81 MG PO CHEW
324.0000 mg | CHEWABLE_TABLET | Freq: Once | ORAL | Status: AC
  Administered 2022-11-28: 324 mg via ORAL
  Filled 2022-11-28: qty 4

## 2022-11-28 MED ORDER — THIAMINE MONONITRATE 100 MG PO TABS
100.0000 mg | ORAL_TABLET | Freq: Every day | ORAL | Status: DC
  Administered 2022-11-28: 100 mg via ORAL
  Filled 2022-11-28: qty 1

## 2022-11-28 MED ORDER — ADULT MULTIVITAMIN W/MINERALS CH
1.0000 | ORAL_TABLET | Freq: Every day | ORAL | Status: DC
  Administered 2022-11-28: 1 via ORAL
  Filled 2022-11-28: qty 1

## 2022-11-28 MED ORDER — LORAZEPAM 2 MG/ML IJ SOLN: 1.0000 mg | INTRAMUSCULAR | Status: DC | PRN

## 2022-11-28 MED ORDER — THIAMINE HCL 100 MG/ML IJ SOLN: 100.0000 mg | Freq: Every day | INTRAMUSCULAR | Status: DC

## 2022-11-28 MED ORDER — GABAPENTIN 300 MG PO CAPS
300.0000 mg | ORAL_CAPSULE | Freq: Three times a day (TID) | ORAL | Status: DC
  Administered 2022-11-28: 300 mg via ORAL
  Filled 2022-11-28: qty 1

## 2022-11-28 NOTE — ED Notes (Signed)
Patient to [facility] via [employer]

## 2022-11-28 NOTE — ED Notes (Signed)
Patient alert and cooperative while taking medications. Offered fluids.

## 2022-11-28 NOTE — ED Notes (Signed)
First encounter with pt. Pt is sleeping soundly RR:16, pt ripped off monitoring equipment and wearing burgundy scrubs.

## 2022-11-28 NOTE — BH Assessment (Addendum)
Comprehensive Clinical Assessment (CCA) Screening, Triage and Referral Note  11/28/2022 Tonya Nichols [place] [id number]  Shemicka Nichols, 37 year old female who presents to [facility] ED voluntarily for treatment. Per triage note, BIBA Per ems: Pt [place] [employer] [place] w/ c/o [facility] & chest pain after smoking & snorting crack today. Pt reports Tonya may be pregnant & having a miscarriage as well. Pt is suicidal as well.   During TTS assessment pt presents alert and oriented x 4, restless but cooperative, and mood-congruent with affect. The pt does not appear to be responding to internal or external stimuli. Neither is the pt presenting with any delusional thinking. Pt verified the information provided to triage RN.   Pt identifies her main complaint to be that Tonya is tired of current living conditions. Patient reports Tonya lost custody of her younger children, and Tonya is trying to get them back but finding it extremely difficult because of her substance use. Patient reports Tonya drinks a pint of liquor and uses "a lot" of cocaine daily. Patient states just 3 weeks ago Tonya was clean but relapsed. Pt reports INPT hx at [facility] and [facility].  Patient reports no prior suicidal attempts and denies current SI/HI/AH/VH. Patient believes detox/rehab would be beneficial.    Per Tonya Muir, NP, pt does not meet criteria for inpatient psychiatric admission. Patient referral will be faxed to detox treatment facilities.   Chief Complaint:  Chief Complaint  Patient presents with   Medical Clearance   Addiction Problem    Patient reports Tonya needs help with substance use.    Visit Diagnosis: Alcohol abuse with alcohol-induced mood disorder  Patient Reported Information How did you hear about [place]? Self  What Is the Reason for Your Visit/Call Today? Patient brought into ED for substance use.  How Long Has This Been Causing You Problems? > than 6 months  What Do You Feel Would Help You the Most Today? Alcohol or Drug  Use Treatment; Treatment for Depression or other mood problem   Have You Recently Had Any Thoughts About Hurting Yourself? Yes  Are You Planning to Commit Suicide/Harm Yourself At This time? No   Have you Recently Had Thoughts About Hurting Someone Tonya Nichols? No  Are You Planning to Harm Someone at This Time? No  Explanation: No data recorded  Have You Used Any Alcohol or Drugs in the Past 24 Hours? Yes  How Long Ago Did You Use Drugs or Alcohol? No data recorded What Did You Use and How Much? Cocaine and alcohol   Do You Currently Have a Therapist/Psychiatrist? No  Name of Therapist/Psychiatrist: No data recorded  Have You Been Recently Discharged From Any Office Practice or Programs? No  Explanation of Discharge From Practice/Program: No data recorded   CCA Screening Triage Referral Assessment Type of Contact: Face-to-Face  Telemedicine Service Delivery:   Is this Initial or Reassessment?   Date Telepsych consult ordered in [facility]:    Time Telepsych consult ordered in [facility]:    Location of Assessment[facility] ED  Provider Location: [facility] ED    Collateral Involvement: None provided   Does Patient Have a Court Appointed Legal Guardian? No data recorded Name and Contact of Legal Guardian: No data recorded If Minor and Not Living with Parent(s), Who has Custody? No data recorded Is CPS involved or ever been involved? No data recorded Is APS involved or ever been involved? No data recorded  Patient Determined To Be At Risk for Harm To Self or Others  Based on Review of Patient Reported Information or Presenting Complaint? No  Method: No Plan  Availability of Means: No access or NA  Intent: Vague intent or NA  Notification Required: No need or identified person  Additional Information for Danger to Others Potential: No data recorded Additional Comments for Danger to Others Potential: No data recorded Are There Guns or Other Weapons in Your Home? No data recorded Types of  Guns/Weapons: No data recorded Are These Weapons Safely Secured?                            No data recorded Who Could Verify You Are Able To Have These Secured: No data recorded Do You Have any Outstanding Charges, Pending Court Dates, Parole/Probation? No data recorded Contacted To Inform of Risk of Harm To Self or Others: No data recorded  Does Patient Present under Involuntary Commitment? No    County of Residence: [place]   Patient Currently Receiving the Following Services: Not Receiving Services   Determination of Need: Emergent (2 hours)   Options For Referral: Chemical Dependency Intensive Outpatient Therapy (CDIOP)   Discharge Disposition:     Tonya Nichols, Counselor, LCAS-A

## 2022-11-28 NOTE — ED Notes (Addendum)
PT. Medically cleared Per MD. Moving to 12H.

## 2022-11-28 NOTE — ED Notes (Signed)
Pt belongings going with her bag 2/2 bags.

## 2022-11-28 NOTE — ED Notes (Signed)
RN attempted to call [facility] and give report. No one answered the admission line.

## 2022-11-28 NOTE — Discharge Instructions (Signed)
Information provided for patient.

## 2022-11-28 NOTE — Consult Note (Signed)
[facility] Face-to-Face Psychiatry Consult   Reason for Consult:  alcohol and cocaine abuse with chest pain and suicidal ideations Referring Physician:  EDP Patient Identification: PANSEY TRANA MRN:  [id number] Principal Diagnosis: Alcohol abuse with alcohol-induced mood disorder (HCC) Diagnosis:  Principal Problem:   Alcohol abuse with alcohol-induced mood disorder (HCC) Active Problems:   Cocaine abuse (HCC)   Total Time spent with patient: 45 minutes  Subjective:   Tonya Nichols is a 37 y.o. female patient admitted with substance abuse and suicidal ideations.  HPI:  37 yo female admitted to the hospital under the influence of alcohol and cocaine complaining of chest pain with thoughts that she might be pregnant.  Then, she expressed suicidal ideations initially.  The psych team attempted to assess her this morning but she was too drowsy to awaken.  This afternoon, she denies suicidal ideations, past attempts or hospitalizations (mental health) and wants to regain custody of her children.  "My kids (older) have my little kids."  She was upset because she was "clean" for 3 weeks until this relapse.  Maripat wants to go to rehab to assist with substance dependency.  She reported drinking a pint of liquor daily and using "a lot" of cocaine/crack.  Denies homicidal ideations, history of seizures, and psychosis.  She reported having some "shaking" related to her detox, Ativan detox protocol in place.  Referred to [facility] in [place] among other places.  Psych cleared for rehab.  Caveat:  Her daughter requested information and patient gave permission to discuss her case.  The daughter was appreciative of the call because she stated her mother is an "addict" and they did not know where she was.  They were concerned that she got hurt on the streets.  She expressed gratitude and appreciation for letting her know that her mother was doing ok and planning on going to rehab.   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-took Zoloft x 1/ 1/2 mth   Gonorrhea    Grand multipara    History of HPV infection    History of UTI    2016   Insufficient prenatal care    Marijuana abuse    Post partum depression 07/20/2011   UTI (lower urinary tract infection)    Vaginal Pap smear with ASC-US     Past Surgical History:  Procedure Laterality Date   denies surgical history     NO PAST SURGERIES     Family History:  Family History  Problem Relation Age of Onset   Hepatitis C Mother    COPD Mother    Arthritis Mother    Hepatitis C Father    Cancer Father    Healthy Brother    Asthma Daughter    Healthy Daughter    Healthy Daughter    Healthy Daughter    Healthy Daughter    Healthy Daughter    Healthy Daughter    Hearing loss Son    Healthy Son    Healthy Son    Dementia Maternal  Grandmother    Heart attack Maternal Grandfather    Anesthesia problems Neg Hx    Alcohol abuse Neg Hx    Family Psychiatric  History: see above Social History:  Social History   Substance and Sexual Activity  Alcohol Use Not Currently   Comment: Last drank [place] iced teas 09/22/2021     Social History   Substance and Sexual Activity  Drug Use Not Currently   Types: Marijuana, Cocaine   Comment: Last cocaine use 05/2021 and marijuana use 07/2021.    Social History   Socioeconomic History   Marital status: Single    Spouse name: Not on file   Number of children: 11   Years of education: 7   Highest education level: 7th grade  Occupational History   Occupation: unemployed  Tobacco Use   Smoking  status: Former    Current packs/day: 0.00    Average packs/day: 0.1 packs/day for 14.0 years (1.4 ttl pk-yrs)    Types: Cigarettes    Start date: 01/29/2008    Quit date: 01/28/2022    Years since quitting: 0.8    Passive exposure: Past   Smokeless tobacco: Never   Tobacco comments:    currently smokes 3 cigarettes per week  Vaping Use   Vaping status: Never Used  Substance and Sexual Activity   Alcohol use: Not Currently    Comment: Last drank [place] iced teas 09/22/2021   Drug use: Not Currently    Types: Marijuana, Cocaine    Comment: Last cocaine use 05/2021 and marijuana use 07/2021.   Sexual activity: Yes    Birth control/protection: None  Other Topics Concern   Not on file  Social History Narrative   Lives with  6 of her children.   Social Determinants of Health   Financial Resource Strain: Low Risk  (07/25/2021)   Overall Financial Resource Strain (CARDIA)    Difficulty of Paying Living Expenses: Not hard at all  Food Insecurity: No Food Insecurity (09/25/2022)   Hunger Vital Sign    Worried About Running Out of Food in the Last Year: Never true    Ran Out of Food in the Last Year: Never true  Transportation Needs: Unmet Transportation Needs (09/25/2022)   PRAPARE - Transportation    Lack of Transportation (Medical): Yes    Lack of Transportation (Non-Medical): Yes  Physical Activity: Inactive (09/25/2022)   Exercise Vital Sign    Days of Exercise per Week: 0 days    Minutes of Exercise per Session: 0 min  Stress: No Stress Concern Present (09/25/2022)   [employer] of Occupational Health - Occupational Stress Questionnaire    Feeling of Stress : Only a little  Social Connections: Moderately Integrated (09/25/2022)   Social Connection and Isolation Panel [NHANES]    Frequency of Communication with Friends and Family: More than three times a week    Frequency of Social Gatherings with Friends and Family: More than three times a week    Attends Religious  Services: More than 4 times per year    Active Member of [employer] or Organizations: Yes    Attends Engineer, structural: More than 4 times per year    Marital Status: Never married   Additional Social History:    Allergies:  No Known Allergies  Labs:  Results for orders placed or performed during the hospital encounter of 11/28/22 (from the past 48 hour(s))  CBC     Status: Abnormal   Collection Time: 11/28/22  6:25 AM  Result Value Ref Range   WBC 11.1 (H) 4.0 - 10.5 K/uL   RBC 4.84 3.87 - 5.11 MIL/uL   Hemoglobin 13.6 12.0 - 15.0 g/dL   HCT 82.9 56.2 - 13.0 %   MCV 83.7 80.0 - 100.0 fL   MCH 28.1 26.0 - 34.0 pg   MCHC 33.6 30.0 - 36.0 g/dL   RDW 86.5 78.4 - 69.6 %   Platelets 269 150 - 400 K/uL   nRBC 0.0 0.0 - 0.2 %    Comment: Performed at [facility], [address], [place], [place] [address]  Troponin I (High Sensitivity)     Status: None   Collection Time: 11/28/22  6:25 AM  Result Value Ref Range   Troponin I (High Sensitivity) 5 <18 ng/L    Comment: (NOTE) Elevated high sensitivity troponin I (hsTnI) values and significant  changes across serial measurements may suggest ACS but many other  chronic and acute conditions are known to elevate hsTnI results.  Refer to the "Links" section for chest pain algorithms and additional  guidance. Performed at [facility], [address] [place], [place] [address]   Comprehensive metabolic panel     Status: Abnormal   Collection Time: 11/28/22  6:25 AM  Result Value Ref Range   Sodium 140 135 - 145 mmol/L   Potassium 3.7 3.5 - 5.1 mmol/L   Chloride 109 98 - 111 mmol/L   CO2 19 (L) 22 - 32 mmol/L   Glucose, Bld 86 70 - 99 mg/dL    Comment: Glucose reference range applies only to samples taken after fasting for at least 8 hours.   BUN 9 6 - 20 mg/dL   Creatinine, Ser 4.01 0.44 - 1.00 mg/dL   Calcium 8.5 (L) 8.9 - 10.3 mg/dL   Total Protein 7.3 6.5 - 8.1 g/dL   Albumin 3.9 3.5 - 5.0 g/dL    AST 22 15 - 41 U/L   ALT 18 0 - 44 U/L   Alkaline Phosphatase 66 38 - 126 U/L   Total Bilirubin 0.5 0.3 - 1.2 mg/dL   GFR, Estimated >02 >72 mL/min    Comment: (NOTE) Calculated using the CKD-EPI Creatinine Equation (2021)    Anion gap 12 5 - 15    Comment: Performed at [facility], [address], [place], [place] [address]  Acetaminophen level     Status: Abnormal   Collection Time: 11/28/22  6:25 AM  Result Value Ref Range   Acetaminophen (Tylenol), Serum <10 (L) 10 - 30 ug/mL    Comment: (NOTE) Therapeutic concentrations vary significantly. A range of 10-30 ug/mL  may be an effective concentration for many patients. However, some  are best treated at concentrations outside of this range. Acetaminophen concentrations >150 ug/mL at 4 hours after ingestion  and >50 ug/mL at 12 hours after ingestion are often associated with  toxic reactions.  Performed at [facility], [address] [place], [place] [address]   Ethanol     Status: Abnormal   Collection Time: 11/28/22  6:25 AM  Result Value Ref Range   Alcohol, Ethyl (B) 52 (H) <10 mg/dL    Comment: (NOTE) Lowest detectable limit for serum alcohol is 10 mg/dL.  For medical purposes only. Performed at [facility], [address] [place], [place] [address]   Salicylate level     Status: Abnormal   Collection Time: 11/28/22  6:25 AM  Result Value Ref Range   Salicylate  Lvl <7.0 (L) 7.0 - 30.0 mg/dL    Comment: Performed at [facility], [address] [place], [place] [address]  Urine Drug Screen, Qualitative     Status: Abnormal   Collection Time: 11/28/22  6:25 AM  Result Value Ref Range   Tricyclic, Ur Screen POSITIVE (A) NONE DETECTED   Amphetamines, Ur Screen NONE DETECTED NONE DETECTED   MDMA (Ecstasy)Ur Screen NONE DETECTED NONE DETECTED   Cocaine Metabolite,Ur [place] POSITIVE (A) NONE DETECTED   Opiate, Ur Screen NONE DETECTED NONE DETECTED   Phencyclidine (PCP) Ur S NONE  DETECTED NONE DETECTED   Cannabinoid 50 Ng, Ur [place] POSITIVE (A) NONE DETECTED   Barbiturates, Ur Screen NONE DETECTED NONE DETECTED   Benzodiazepine, Ur Scrn NONE DETECTED NONE DETECTED   Methadone Scn, Ur NONE DETECTED NONE DETECTED    Comment: (NOTE) Tricyclics + metabolites, urine    Cutoff 1000 ng/mL Amphetamines + metabolites, urine  Cutoff 1000 ng/mL MDMA (Ecstasy), urine              Cutoff 500 ng/mL Cocaine Metabolite, urine          Cutoff 300 ng/mL Opiate + metabolites, urine        Cutoff 300 ng/mL Phencyclidine (PCP), urine         Cutoff 25 ng/mL Cannabinoid, urine                 Cutoff 50 ng/mL Barbiturates + metabolites, urine  Cutoff 200 ng/mL Benzodiazepine, urine              Cutoff 200 ng/mL Methadone, urine                   Cutoff 300 ng/mL  The urine drug screen provides only a preliminary, unconfirmed analytical test result and should not be used for non-medical purposes. Clinical consideration and professional judgment should be applied to any positive drug screen result due to possible interfering substances. A more specific alternate chemical method must be used in order to obtain a confirmed analytical result. Gas chromatography / mass spectrometry (GC/MS) is the preferred confirm atory method. Performed at [facility], [address] [place], [place] [address]   hCG, quantitative, pregnancy     Status: None   Collection Time: 11/28/22  6:25 AM  Result Value Ref Range   hCG, Beta Chain, Quant, S <1 <5 mIU/mL    Comment:          GEST. AGE      CONC.  (mIU/mL)   <=1 WEEK        5 - 50     2 WEEKS       50 - 500     3 WEEKS       100 - 10,000     4 WEEKS     1,000 - 30,000     5 WEEKS     3,500 - 115,000   6-8 WEEKS     12,000 - 270,000    12 WEEKS     15,000 - 220,000        FEMALE AND NON-PREGNANT FEMALE:     LESS THAN 5 mIU/mL Performed at [facility], [address] [place], [place] [address]   Troponin I (High  Sensitivity)     Status: None   Collection Time: 11/28/22  8:12 AM  Result Value Ref Range   Troponin I (High Sensitivity) 5 <18 ng/L    Comment: (NOTE) Elevated high  sensitivity troponin I (hsTnI) values and significant  changes across serial measurements may suggest ACS but many other  chronic and acute conditions are known to elevate hsTnI results.  Refer to the "Links" section for chest pain algorithms and additional  guidance. Performed at [facility], [address], [place], [place] [address]     Current Facility-Administered Medications  Medication Dose Route Frequency Provider Last Rate Last Admin   folic acid (FOLVITE) tablet 1 mg  1 mg Oral Daily Pilar Jarvis, MD   1 mg at 11/28/22 0945   LORazepam (ATIVAN) tablet 1-4 mg  1-4 mg Oral Q1H PRN Pilar Jarvis, MD       Or   LORazepam (ATIVAN) injection 1-4 mg  1-4 mg Intravenous Q1H PRN Pilar Jarvis, MD       medroxyPROGESTERone Acetate SUSY 150 mg  150 mg Intramuscular Q90 days Berniece Salines, FNP   150 mg at 06/08/22 1229   multivitamin with minerals tablet 1 tablet  1 tablet Oral Daily Pilar Jarvis, MD   1 tablet at 11/28/22 0945   thiamine (VITAMIN B1) tablet 100 mg  100 mg Oral Daily Pilar Jarvis, MD   100 mg at 11/28/22 0945   Or   thiamine (VITAMIN B1) injection 100 mg  100 mg Intravenous Daily Pilar Jarvis, MD       Current Outpatient Medications  Medication Sig Dispense Refill   buPROPion (WELLBUTRIN XL) 150 MG 24 hr tablet Take 150 mg by mouth every morning.     cloNIDine (CATAPRES) 0.1 MG tablet Take 0.1 mg by mouth 2 (two) times daily.     fluticasone (FLONASE) 50 MCG/ACT nasal spray Place 2 sprays into both nostrils daily. 16 g 6   hydrOXYzine (ATARAX) 50 MG tablet Take 50 mg by mouth daily.     traZODone (DESYREL) 100 MG tablet Take 100 mg by mouth at bedtime.     benzonatate (TESSALON) 100 MG capsule Take 1 capsule (100 mg total) by mouth 2 (two) times daily as needed for cough. (Patient not taking:  Reported on 11/28/2022) 20 capsule 0   medroxyPROGESTERone (DEPO-PROVERA) 150 MG/ML injection Inject 1 mL (150 mg total) into the muscle every 3 (three) months. 1 mL 3   ondansetron (ZOFRAN) 4 MG tablet Take 1 tablet (4 mg total) by mouth every 8 (eight) hours as needed for nausea or vomiting. (Patient not taking: Reported on 11/28/2022) 20 tablet 0    Musculoskeletal: Strength & Muscle Tone: decreased Gait & Station:  did not witness Patient leans: N/A  Psychiatric Specialty Exam: Physical Exam Vitals and nursing note reviewed.  Constitutional:      Appearance: Normal appearance.  HENT:     Head: Normocephalic.     Nose: Nose normal.  Pulmonary:     Effort: Pulmonary effort is normal.  Musculoskeletal:     Cervical back: Normal range of motion.  Neurological:     General: No focal deficit present.     Mental Status: She is alert and oriented to person, place, and time.     Review of Systems  Psychiatric/Behavioral:  Positive for substance abuse. The patient is nervous/anxious.   All other systems reviewed and are negative.   Blood pressure 127/76, pulse 63, temperature 98.7 F (37.1 C), temperature source Oral, resp. rate 16, SpO2 97%, not currently breastfeeding.There is no height or weight on file to calculate BMI.  General Appearance: Disheveled  Eye Contact:  Good  Speech:  Normal Rate  Volume:  Normal  Mood:  Anxious  Affect:  Congruent  Thought Process:  Coherent  Orientation:  Full (Time, Place, and Person)  Thought Content:  WDL and Logical  Suicidal Thoughts:  No  Homicidal Thoughts:  No  Memory:  Immediate;   Good Recent;   Good Remote;   Good  Judgement:  Fair  Insight:  Fair  Psychomotor Activity:  Decreased  Concentration:  Concentration: Good and Attention Span: Good  Recall:  Good  Fund of Knowledge:  Fair  Language:  Good  Akathisia:  No  Handed:  Right  AIMS (if indicated):     Assets:  Leisure Time Physical Health Resilience  ADL's:  Intact   Cognition:  WNL  Sleep:        Physical Exam: Physical Exam Vitals and nursing note reviewed.  Constitutional:      Appearance: Normal appearance.  HENT:     Head: Normocephalic.     Nose: Nose normal.  Pulmonary:     Effort: Pulmonary effort is normal.  Musculoskeletal:     Cervical back: Normal range of motion.  Neurological:     General: No focal deficit present.     Mental Status: She is alert and oriented to person, place, and time.    Review of Systems  Psychiatric/Behavioral:  Positive for substance abuse. The patient is nervous/anxious.   All other systems reviewed and are negative.  Blood pressure 127/76, pulse 63, temperature 98.7 F (37.1 C), temperature source Oral, resp. rate 16, SpO2 97%, not currently breastfeeding. There is no height or weight on file to calculate BMI.  Treatment Plan Summary: Alcohol abuse with alcohol induced mood disorder: Ativan detox protocol in place Referrals to substance abuse rehab  Disposition: No evidence of imminent risk to self or others at present.   Patient does not meet criteria for psychiatric inpatient admission.  Nanine Means, NP 11/28/2022 5:44 PM

## 2022-11-28 NOTE — ED Provider Notes (Signed)
[facility] Provider Note    Event Date/Time   First MD Initiated Contact with Patient 11/28/22 [phone]     (approximate)   History   Medical Clearance   HPI  Tonya Nichols is a 37 y.o. female   Past medical history of depression, polysubstance use, alcohol use, who presents to the emergency department with alcohol intoxication, crack cocaine use, chest pain and depression/suicidal ideation.  She states that she has been depressed lately thinking about losing custody of her children, and has been using a lot of alcohol and drugs including crack cocaine.  Her last alcohol use was just prior to arrival and smoked crack cocaine over the last several days resulting in some chest pain.  She has no respiratory complaints, denies cough or fever, denies GI or GU complaints.  She has passive suicidal ideation with no attempt or self-harm.  External Medical Documents Reviewed: [facility] clinical summary for past medical history medications      Physical Exam   Triage Vital Signs: ED Triage Vitals  Encounter Vitals Group     BP 11/28/22 0614 (!) 152/130     Systolic BP Percentile --      Diastolic BP Percentile --      Pulse Rate 11/28/22 0614 (!) 118     Resp 11/28/22 0614 19     Temp --      Temp src --      SpO2 11/28/22 0614 98 %     Weight --      Height --      Head Circumference --      Peak Flow --      Pain Score 11/28/22 0613 6     Pain Loc --      Pain Education --      Exclude from Growth Chart --     Most recent vital signs: Vitals:   11/28/22 0619 11/28/22 0626  BP: (!) 131/93   Pulse: (!) 106 (!) 102  Resp:  17  Temp:    SpO2: 100% 100%    General: Awake, no distress.  CV:  Good peripheral perfusion.  Resp:  Normal effort.  Abd:  No distention.  Other:  Tearful appearing, mildly tachycardic with clear lungs soft nontender abdomen appears euvolemic.  No obvious trauma noted.  Does not appear to be in  acute withdrawal with no tremors tongue fasciculations, instead appears somewhat intoxicated with slurred speech   ED Results / Procedures / Treatments   Labs (all labs ordered are listed, but only abnormal results are displayed) Labs Reviewed  CBC - Abnormal; Notable for the following components:      Result Value   WBC 11.1 (*)    All other components within normal limits  COMPREHENSIVE METABOLIC PANEL  ACETAMINOPHEN LEVEL  ETHANOL  SALICYLATE LEVEL  URINE DRUG SCREEN, QUALITATIVE ([facility] ONLY)  HCG, QUANTITATIVE, PREGNANCY  POC URINE PREG, ED  TROPONIN I (HIGH SENSITIVITY)     I ordered and reviewed the above labs they are notable for blood cell count mildly elevated 11's, H&H within normal limits.  EKG  ED ECG REPORT I, Pilar Jarvis, the attending physician, personally viewed and interpreted this ECG.   Date: 11/28/2022  EKG Time: 0624  Rate: 97  Rhythm: sinus  Axis: nl  Intervals:none  ST&T Change: no stemi    RADIOLOGY I independently reviewed and interpreted chest x-ray and see no obvious pneumothorax or focality I also reviewed radiologist's  formal read.   PROCEDURES:  Critical Care performed: No  Procedures   MEDICATIONS ORDERED IN ED: Medications  LORazepam (ATIVAN) tablet 1-4 mg (has no administration in time range)    Or  LORazepam (ATIVAN) injection 1-4 mg (has no administration in time range)  thiamine (VITAMIN B1) tablet 100 mg (has no administration in time range)    Or  thiamine (VITAMIN B1) injection 100 mg (has no administration in time range)  folic acid (FOLVITE) tablet 1 mg (has no administration in time range)  multivitamin with minerals tablet 1 tablet (has no administration in time range)  aspirin chewable tablet 324 mg (324 mg Oral Given 11/28/22 0640)  LORazepam (ATIVAN) tablet 2 mg (2 mg Oral Given 11/28/22 1610)    IMPRESSION / MDM / ASSESSMENT AND PLAN / ED COURSE  I reviewed the triage vital signs and the nursing notes.                                 Patient's presentation is most consistent with acute presentation with potential threat to life or bodily function.  Differential diagnosis includes, but is not limited to, crack cocaine use, ACS, alcohol intoxication, respiratory infection, substance-induced mood disorder, suicidal ideation passive   The patient is on the cardiac monitor to evaluate for evidence of arrhythmia and/or significant heart rate changes.  MDM:    Patient admits to crack cocaine use and alcohol use, tearful and depressed with suicidal ideation passive.  Also reports chest pain.  Consider ACS given risk factors including her drug use, check EKG which fortunately no STEMI will follow-up with troponins.  Check basic labs and toxicologic labs, and given her passive suicidal ideation and intoxication we will have her see psychiatry after medically cleared       FINAL CLINICAL IMPRESSION(S) / ED DIAGNOSES   Final diagnoses:  Crack cocaine use  Alcoholic intoxication without complication (HCC)  Passive suicidal ideations  Nonspecific chest pain     Rx / DC Orders   ED Discharge Orders     None        Note:  This document was prepared using Dragon voice recognition software and may include unintentional dictation errors.    Pilar Jarvis, MD 11/28/22 [phone]

## 2022-11-28 NOTE — ED Notes (Signed)
Pt sitting up in bed eating food.

## 2022-11-28 NOTE — BH Assessment (Signed)
PATIENT BED AVAILABLE NOW  Patient has been accepted to [facility].  Accepting physician is Dr. Sofie Hartigan.  Call report to [phone].  Representative was [employer].   ER Staff is aware of it:  [facility] ER Secretary  Dr. Roxan Hockey, ER MD  [facility] Patient's Nurse

## 2022-11-28 NOTE — ED Notes (Signed)
RN gave report to [facility].

## 2022-11-28 NOTE — BH Assessment (Signed)
Detox  Referral information for detox treatment faxed to:   [facility] [phone] or [phone])   [facility] [phone])   [facility] [phone])   [facility] [phone])  . [facility] [phone])   [facility] [phone])  . [facility] [phone])

## 2022-11-28 NOTE — ED Notes (Signed)
Spoke with CN and EDP, pt is medically cleared and will move to 12H at this time.

## 2022-11-28 NOTE — ED Notes (Signed)
Safety rounding q 15 min initiated at this time.

## 2022-11-28 NOTE — ED Notes (Signed)
RN attempted to call [facility] and give report. No one answered the admission line. RN notified charge nurse.

## 2022-11-28 NOTE — Consult Note (Signed)
The client was very drowsy on rounds and would not awaken to talk, will try later this afternoon.  Nanine Means, PMHNP

## 2022-11-28 NOTE — ED Notes (Signed)
Pt continues to sleep.

## 2022-11-28 NOTE — ED Notes (Signed)
Pt is still in medical treatment room. Pt has dressed self in burgundy scrubs provided by night RN. Pt will be moved to quad once medically cleared and belongings will be documented. Pt is sleeping.

## 2022-11-28 NOTE — ED Notes (Signed)
Pt continues to sleep. Respirations unlabored.

## 2022-11-28 NOTE — ED Notes (Signed)
Pt is psych cleared and is not SI per psych eval (pending note). Pt is to transfer to [facility] for drug rehab.

## 2022-11-28 NOTE — ED Notes (Signed)
[facility] called spoke with Rudell Cobb to transport patient to [facility]

## 2022-11-28 NOTE — ED Triage Notes (Signed)
BIBA Per ems: Pt coming from [employer] parking lot w/ c/o [facility] & chest pain after smoking & snorting crack today. Pt reports she may be pregnant & having a miscarriage as well. Pt is suicidal as well.

## 2022-11-28 NOTE — ED Notes (Signed)
Psych NP at bedside

## 2022-11-28 NOTE — ED Notes (Signed)
Pt has meal tray at bedside. Pt currently sleeping on side.

## 2022-11-28 NOTE — BH Assessment (Addendum)
Detox   Referral checks:    [facility] [phone] or [phone])    [facility] [phone])    [facility] [phone])    [facility] [phone])   . [facility] [phone])    [facility] [phone]) No beds available tonight, staff reports to call tomorrow at 8am for a potential bed available after they discharge a patient   . [facility] [phone])

## 2022-11-28 NOTE — ED Notes (Signed)
Psych NP Shaune Pollack is calling daughter to give update on status/plan of care.

## 2022-11-28 NOTE — ED Notes (Signed)
Daughter of pt called ED and demanding to know how pt is doing and stating "we haven't seen her since yesterday!". ED secretary had to ask her to stop raising her voice (yelling) several times. Sent message to psych NP to request that she call the daughter. Patient states that it is okay to give information to her daughter. Daughter is Corrie Dandy, [phone].

## 2023-03-22 ENCOUNTER — Ambulatory Visit: Payer: MEDICAID

## 2023-03-22 NOTE — Progress Notes (Signed)
Patient is in office today for a nurse visit for Birth Control Injection. Patient did not bring in med

## 2023-06-06 ENCOUNTER — Ambulatory Visit: Payer: Self-pay

## 2023-06-06 ENCOUNTER — Encounter: Payer: Self-pay | Admitting: Nurse Practitioner

## 2023-06-06 ENCOUNTER — Ambulatory Visit (INDEPENDENT_AMBULATORY_CARE_PROVIDER_SITE_OTHER): Payer: MEDICAID | Admitting: Nurse Practitioner

## 2023-06-06 VITALS — BP 122/72 | HR 95 | Temp 98.0°F | Resp 18 | Ht 63.0 in | Wt 212.6 lb

## 2023-06-06 DIAGNOSIS — R519 Headache, unspecified: Secondary | ICD-10-CM | POA: Diagnosis not present

## 2023-06-06 DIAGNOSIS — M7989 Other specified soft tissue disorders: Secondary | ICD-10-CM

## 2023-06-06 DIAGNOSIS — N912 Amenorrhea, unspecified: Secondary | ICD-10-CM

## 2023-06-06 LAB — POCT URINE PREGNANCY: Preg Test, Ur: NEGATIVE

## 2023-06-06 NOTE — Telephone Encounter (Signed)
 Chief Complaint: bilateral hand swelling Symptoms: swelling, worse in mornings Frequency: about 3 weeks Pertinent Negatives: Patient denies fever, difficulty breathing, SOB, injury to hands Disposition: [] ED /[] Urgent Care (no appt availability in office) / [x] Appointment(In office/virtual)/ []  [facility]/ [] Home Care/ [] Refused Recommended Disposition /[]  Mobile Bus/ []  Follow-up with PCP Additional Notes: Pt states that in the mornings her hands  have been swollen. Pt states that sometimes if she takes a nap that will reduce the swelling. Pt states she has never had this before. Pt states that she could be pregnant. Pt states that she took pepto yesterday and today her stool was black. Pt scheduled for this afternoon, offered AM appts, pt declined d/t school.   Copied from CRM [id number]. Topic: Clinical - Red Word Triage >> Jun 06, 2023  8:48 AM Marland Kitchen D wrote: Had a not normal bowel movement  states it was black and wants to see doctor. Patient is having body aches and swelling of both hands every morning. Reason for Disposition  MODERATE hand swelling (e.g., visible swelling of hand and fingers; pitting edema)  Answer Assessment - Initial Assessment Questions 1. ONSET: "When did the swelling start?" (e.g., minutes, hours, days)     About 3 weeks 2. LOCATION: "What part of the hand is swollen?"  "Are both hands swollen or just one hand?"     Only fingers 3. SEVERITY: "How bad is the swelling?" (e.g., localized; mild, moderate, severe)   - BALL OR LUMP: small ball or lump   - LOCALIZED: puffy or swollen area or patch of skin   - JOINT SWELLING: swelling of a joint   - MILD: puffiness or mild swelling of fingers or hand   - MODERATE: fingers and hand are swollen   - SEVERE: swelling of entire hand and up into forearm     Can't take rings off, only swelling to the fingers 4. REDNESS: "Does the swelling look red or infected?"     denies 5. PAIN: "Is the swelling  painful to touch?" If Yes, ask: "How painful is it?"   (Scale 1-10; mild, moderate or severe)     denies 6. FEVER: "Do you have a fever?" If Yes, ask: "What is it, how was it measured, and when did it start?"      denies 7. CAUSE: "What do you think is causing the hand swelling?" (e.g., heat, insect bite, pregnancy, recent injury)     Denies, just stopped taking anxiety and antidepressant 8. MEDICAL HISTORY: "Do you have a history of heart failure, kidney disease, liver failure, or cancer?"     denies 9. RECURRENT SYMPTOM: "Have you had hand swelling before?" If Yes, ask: "When was the last time?" "What happened that time?"     denies 10. OTHER SYMPTOMS: "Do you have any other symptoms?" (e.g., blurred vision, difficulty breathing, headache)       denies 11. PREGNANCY: "Is there any chance you are pregnant?" "When was your last menstrual period?"       Might be  Protocols used: Hand Swelling-A-AH

## 2023-06-06 NOTE — Progress Notes (Signed)
 BP 122/72   Pulse 95   Temp 98 F (36.7 C)   Resp 18   Ht 5\' 3"  (1.6 m)   Wt 212 lb 9.6 oz (96.4 kg)   SpO2 97%   BMI 37.66 kg/m    Subjective:    Patient ID: Faviola L Hubka, female    DOB: May 30, 1985, 38 y.o.   MRN: [id number]  HPI: Paitynn Mikus Geers is a 38 y.o. female  Chief Complaint  Patient presents with   hand swelling    Bilateral, stopped depression/anxiety meds [facility] placed on due to side effects and not making her feel well, thinks she feels fine and does not need medications anymore   Amenorrhea    Been off depo for about a year with no period since, will do urine preg test    Discussed the use of AI scribe software for clinical note transcription with the patient, who gave verbal consent to proceed.  History of Present Illness Dezzie L Nasca is a 38 year old female who presents with bilateral hand swelling and amenorrhea.  She has experienced bilateral hand swelling since January 8th, coinciding with the resumption of her antidepressant and anxiety medications, including clonidine and Lexapro. Despite tapering off these medications over the past two weeks and currently only taking melatonin 5 mg for sleep, the swelling persists. No recent trauma to the hands is reported, though she notes some soreness from cleaning activities. 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 She maintains a diet low in added salt and primarily drinks water.  She has been amenorrheic for approximately six to seven months after discontinuing Depo Provera a year ago. She experienced some spotting two months ago but has not had a full menstrual period since. She reports hot flashes and is concerned about the long-term use of Depo Provera, which she has been using since age 42. A recent urine pregnancy test was negative.  She reports body aches, particularly in her hips and knees, and is concerned  about a potential genetic predisposition to rheumatoid arthritis due to her family history. Her mother has arthritis and receives disability benefits for it.  Headaches are reported, described as pressure in her head when laughing or getting excited. She reports she would like to see a neurologist to have that checked out.           06/06/2023    1:49 PM 10/24/2022    3:22 PM 09/25/2022   10:48 AM  Depression screen PHQ 2/9  Decreased Interest 0 0 0  Down, Depressed, Hopeless 0 0 0  PHQ - 2 Score 0 0 0  Altered sleeping 3 0 1  Tired, decreased energy 1 0 1  Change in appetite 0 0 0  Feeling bad or failure about yourself  0 0 0  Trouble concentrating 0 0 0  Moving slowly or fidgety/restless 0 0 0  Suicidal thoughts 0 0 0  PHQ-9 Score 4 0 2  Difficult doing work/chores Somewhat difficult Not difficult at all Not difficult at all    Relevant past medical, surgical, family and social history reviewed and updated as indicated. Interim medical history since our last visit reviewed. Allergies and medications reviewed and updated.  Review of Systems  Ten systems reviewed and is negative except as mentioned in HPI      Objective:    BP 122/72   Pulse 95   Temp  98 F (36.7 C)   Resp 18   Ht 5\' 3"  (1.6 m)   Wt 212 lb 9.6 oz (96.4 kg)   SpO2 97%   BMI 37.66 kg/m    Wt Readings from Last 3 Encounters:  06/06/23 212 lb 9.6 oz (96.4 kg)  10/24/22 203 lb (92.1 kg)  09/25/22 208 lb 3.2 oz (94.4 kg)    Physical Exam Physical Exam GENERAL: Alert, cooperative, well developed, no acute distress. HEENT: Normocephalic, normal oropharynx, moist mucous membranes. CHEST: Clear to auscultation bilaterally, no wheezes, rhonchi, or crackles. CARDIOVASCULAR: Normal heart rate and rhythm, S1 and S2 normal without murmurs. Distal pulse present. ABDOMEN: Soft, non-tender, non-distended, without organomegaly, normal bowel sounds. EXTREMITIES: No cyanosis, edema, or swelling in  hands. NEUROLOGICAL: Cranial nerves grossly intact, moves all extremities without gross motor or sensory deficit.   Results for orders placed or performed in visit on 06/06/23  POCT urine pregnancy   Collection Time: 06/06/23  1:58 PM  Result Value Ref Range   Preg Test, Ur Negative Negative       Assessment & Plan:   Problem List Items Addressed This Visit   None Visit Diagnoses       Bilateral hand swelling    -  Primary   Relevant Orders   CBC with Differential/Platelet   Comprehensive metabolic panel with GFR   C-reactive protein   Sedimentation rate   ANA   Cyclic citrul peptide antibody, IgG   Rheumatoid Factor     Amenorrhea       Relevant Orders   TSH   Estrogens, total   FSH/LH   Prolactin   POCT urine pregnancy (Completed)     Frequent headaches       Relevant Orders   Ambulatory referral to Neurology        Assessment and Plan Assessment & Plan Amenorrhea Amenorrhea for 6-7 months with Depo Provera use since age 38. Negative urine pregnancy test. Possible menopause suggested by hot flashes. Hormonal evaluation planned to assess menopausal status. Concern about long-term Depo Provera use and requested further evaluation. - Order hormonal panel to assess menopausal status - Provide referral for further evaluation related to long-term Depo Provera use  Bilateral hand swelling Bilateral hand swelling since January, coinciding with initiation of antidepressant and anxiety medications. No trauma or significant physical activity. No swelling on physical exam, distal pulses present. Differential includes medication side effects or systemic causes. Discontinued medications to assess impact on symptoms. - Advise to monitor salt intake and maintain hydration - Evaluate impact of medication discontinuation on symptoms  Body aches Generalized body aches, particularly in hips and knees. Family history of arthritis. Differential includes early arthritis or medication  side effects. Discontinued medications to assess impact on symptoms. - Order blood work to evaluate for rheumatoid arthritis - Evaluate impact of medication discontinuation on symptoms  Headaches -she is concerned about a brain tumor from Depo Provera,  she reports she has headaches that are like pressure. She would like to see a neurologist.  Referral placed  Patent reports she has been clean since January. Reports overall she is doing well.       Follow up plan: Return if symptoms worsen or fail to improve.

## 2023-06-08 ENCOUNTER — Encounter: Payer: Self-pay | Admitting: Nurse Practitioner

## 2023-06-11 ENCOUNTER — Other Ambulatory Visit: Payer: Self-pay | Admitting: Nurse Practitioner

## 2023-06-11 DIAGNOSIS — R768 Other specified abnormal immunological findings in serum: Secondary | ICD-10-CM

## 2023-06-11 DIAGNOSIS — M2550 Pain in unspecified joint: Secondary | ICD-10-CM

## 2023-06-12 ENCOUNTER — Other Ambulatory Visit: Payer: Self-pay | Admitting: Nurse Practitioner

## 2023-06-12 DIAGNOSIS — M2550 Pain in unspecified joint: Secondary | ICD-10-CM

## 2023-06-12 DIAGNOSIS — R768 Other specified abnormal immunological findings in serum: Secondary | ICD-10-CM

## 2023-06-15 LAB — CBC WITH DIFFERENTIAL/PLATELET
Absolute Lymphocytes: 2697 {cells}/uL (ref 850–3900)
Absolute Monocytes: 731 {cells}/uL (ref 200–950)
Basophils Absolute: 61 {cells}/uL (ref 0–200)
Basophils Relative: 0.7 %
Eosinophils Absolute: 61 {cells}/uL (ref 15–500)
Eosinophils Relative: 0.7 %
HCT: 41.2 % (ref 35.0–45.0)
Hemoglobin: 13.7 g/dL (ref 11.7–15.5)
MCH: 28.4 pg (ref 27.0–33.0)
MCHC: 33.3 g/dL (ref 32.0–36.0)
MCV: 85.3 fL (ref 80.0–100.0)
MPV: 12.2 fL (ref 7.5–12.5)
Monocytes Relative: 8.4 %
Neutro Abs: 5150 {cells}/uL (ref 1500–7800)
Neutrophils Relative %: 59.2 %
Platelets: 307 10*3/uL (ref 140–400)
RBC: 4.83 10*6/uL (ref 3.80–5.10)
RDW: 12.4 % (ref 11.0–15.0)
Total Lymphocyte: 31 %
WBC: 8.7 10*3/uL (ref 3.8–10.8)

## 2023-06-15 LAB — ANTI-NUCLEAR AB-TITER (ANA TITER): ANA Titer 1: 1:40 {titer} — ABNORMAL HIGH

## 2023-06-15 LAB — COMPREHENSIVE METABOLIC PANEL WITH GFR
AG Ratio: 1.8 (calc) (ref 1.0–2.5)
ALT: 12 U/L (ref 6–29)
AST: 13 U/L (ref 10–30)
Albumin: 4.4 g/dL (ref 3.6–5.1)
Alkaline phosphatase (APISO): 78 U/L (ref 31–125)
BUN: 11 mg/dL (ref 7–25)
CO2: 23 mmol/L (ref 20–32)
Calcium: 9.3 mg/dL (ref 8.6–10.2)
Chloride: 106 mmol/L (ref 98–110)
Creat: 0.75 mg/dL (ref 0.50–0.99)
Globulin: 2.5 g/dL (ref 1.9–3.7)
Glucose, Bld: 87 mg/dL (ref 65–99)
Potassium: 4.3 mmol/L (ref 3.5–5.3)
Sodium: 138 mmol/L (ref 135–146)
Total Bilirubin: 0.3 mg/dL (ref 0.2–1.2)
Total Protein: 6.9 g/dL (ref 6.1–8.1)
eGFR: 103 mL/min/{1.73_m2} (ref >=60)

## 2023-06-15 LAB — CYCLIC CITRUL PEPTIDE ANTIBODY, IGG: Cyclic Citrullin Peptide Ab: <16 U

## 2023-06-15 LAB — ANA: Anti Nuclear Antibody (ANA): POSITIVE — AB

## 2023-06-15 LAB — PROLACTIN: Prolactin: 5.3 ng/mL

## 2023-06-15 LAB — C-REACTIVE PROTEIN: CRP: <3 mg/L (ref <8.0)

## 2023-06-15 LAB — SEDIMENTATION RATE: Sed Rate: 9 mm/h (ref 0–20)

## 2023-06-15 LAB — ESTROGENS, TOTAL: Estrogen: 108 pg/mL

## 2023-06-15 LAB — TSH: TSH: 2.02 m[IU]/L

## 2023-06-15 LAB — FSH/LH
FSH: 3.4 m[IU]/mL
LH: 0.9 m[IU]/mL

## 2023-06-15 LAB — RHEUMATOID FACTOR: Rheumatoid fact SerPl-aCnc: <10 [IU]/mL (ref <14)

## 2023-07-11 ENCOUNTER — Encounter: Payer: MEDICAID | Admitting: Licensed Practical Nurse

## 2023-07-12 ENCOUNTER — Encounter: Payer: Self-pay | Admitting: Licensed Practical Nurse

## 2023-09-25 ENCOUNTER — Ambulatory Visit: Payer: MEDICAID | Admitting: Nurse Practitioner

## 2023-09-26 ENCOUNTER — Ambulatory Visit (INDEPENDENT_AMBULATORY_CARE_PROVIDER_SITE_OTHER): Payer: MEDICAID | Admitting: Nurse Practitioner

## 2023-09-26 ENCOUNTER — Encounter: Payer: Self-pay | Admitting: Nurse Practitioner

## 2023-09-26 VITALS — BP 122/70 | HR 76 | Resp 16 | Ht 63.0 in | Wt 206.0 lb

## 2023-09-26 DIAGNOSIS — M2550 Pain in unspecified joint: Secondary | ICD-10-CM

## 2023-09-26 DIAGNOSIS — Z1231 Encounter for screening mammogram for malignant neoplasm of breast: Secondary | ICD-10-CM | POA: Diagnosis not present

## 2023-09-26 DIAGNOSIS — Z0000 Encounter for general adult medical examination without abnormal findings: Secondary | ICD-10-CM | POA: Diagnosis not present

## 2023-09-26 DIAGNOSIS — H539 Unspecified visual disturbance: Secondary | ICD-10-CM

## 2023-09-26 DIAGNOSIS — J301 Allergic rhinitis due to pollen: Secondary | ICD-10-CM

## 2023-09-26 DIAGNOSIS — Z3009 Encounter for other general counseling and advice on contraception: Secondary | ICD-10-CM | POA: Diagnosis not present

## 2023-09-26 DIAGNOSIS — R768 Other specified abnormal immunological findings in serum: Secondary | ICD-10-CM | POA: Diagnosis not present

## 2023-09-26 LAB — POCT URINE PREGNANCY: Preg Test, Ur: NEGATIVE

## 2023-09-26 MED ORDER — MEDROXYPROGESTERONE ACETATE 150 MG/ML IM SUSP
150.0000 mg | INTRAMUSCULAR | 4 refills | Status: AC
Start: 2023-09-26 — End: ?

## 2023-09-26 MED ORDER — CETIRIZINE HCL 10 MG PO TABS: 10.0000 mg | ORAL_TABLET | Freq: Every day | ORAL | 1 refills | Status: AC

## 2023-09-26 MED ORDER — FLUTICASONE PROPIONATE 50 MCG/ACT NA SUSP
2.0000 | Freq: Every day | NASAL | 6 refills | Status: AC
Start: 2023-09-26 — End: ?

## 2023-09-26 NOTE — Progress Notes (Signed)
 Name: Tonya Nichols   MRN: [id number]    DOB: 1985/07/15   Date:09/26/2023       Progress Note  Subjective  Chief Complaint  Chief Complaint  Patient presents with   Annual Exam    HPI  Patient presents for annual CPE.  Discussed the use of AI scribe software for clinical note transcription with the patient, who gave verbal consent to proceed.  History of Present Illness Tonya Nichols is a 38 year old female who presents for an annual physical exam and discussion of birth control options.  Contraception and pregnancy concerns - Concerned about risk of pregnancy as she is not currently using birth control - Not currently sexually active due to fear of pregnancy - Interested in resuming Depo-Provera , which she has used previously  Menstrual irregularity - Menstrual cycles have been irregular - Last menstrual cycle occurred last month - Describes cycles as 'on, off' and not normal for a long time  Sleep disturbance - History of poor sleep - Restarted trazodone yesterday to address sleep issues  Urinary incontinence - Experiences urinary incontinence - Interested in management options  Tinnitus - Experiences 'whoop, whoop' sounds in her ears  Allergic symptoms - History of severe allergies - Primarily uses Benadryl , which is ineffective  Substance use and sobriety - 34 days sober - Missed rheumatology appointment due to change in contact number and brief period of drug use - Currently back on track with classes and sobriety  Lifestyle and preventive health - Planning to join a gym for regular exercise - Diet is well-balanced - No violence at home - Last dental exam at the beginning of the year - Planning to schedule an eye exam soon   Waist Measurement : 39.5 inches   Diet: well balanced diet Exercise: she is going to get a gym membership   Sleep: not sleeping well, just restarted trazodone  Last dental exam: this year Last eye exam: unknown,  referral  placed  Flowsheet Row Office Visit from 09/26/2023 in [facility]  AUDIT-C Score 0   Depression: Phq 9 is  positive, seen at [facility]    09/26/2023    9:34 AM 06/06/2023    1:49 PM 10/24/2022    3:22 PM 09/25/2022   10:48 AM 08/22/2022   10:29 AM  Depression screen PHQ 2/9  Decreased Interest 3 0 0 0 0  Down, Depressed, Hopeless 1 0 0 0 3  PHQ - 2 Score 4 0 0 0 3  Altered sleeping 3 3 0 1 3  Tired, decreased energy 3 1 0 1 3  Change in appetite 3 0 0 0 1  Feeling bad or failure about yourself  2 0 0 0 3  Trouble concentrating 1 0 0 0 2  Moving slowly or fidgety/restless 0 0 0 0 1  Suicidal thoughts 0 0 0 0 0  PHQ-9 Score 16 4 0 2 16  Difficult doing work/chores  Somewhat difficult Not difficult at all Not difficult at all Somewhat difficult   Hypertension: BP Readings from Last 3 Encounters:  09/26/23 122/70  06/06/23 122/72  11/28/22 (!) 112/59   Obesity: Wt Readings from Last 3 Encounters:  09/26/23 206 lb (93.4 kg)  06/06/23 212 lb 9.6 oz (96.4 kg)  10/24/22 203 lb (92.1 kg)   BMI Readings from Last 3 Encounters:  09/26/23 36.49 kg/m  06/06/23 37.66 kg/m  10/24/22 35.96 kg/m     Vaccines:  HPV: up to at age 68 ,  ask insurance if age between 52-45  Shingrix: 54-40 yo and ask insurance if covered when patient above 45 yo Pneumonia:  educated and discussed with patient. Flu:  educated and discussed with patient.  Hep C Screening: completed STD testing and prevention (HIV/chl/gon/syphilis): completed Intimate partner violence:none Sexual History : not currently , restarting depo Menstrual History/LMP/Abnormal Bleeding: OFR:ojdu month, irregular Incontinence Symptoms: stress incontinence   Breast cancer:  - Last Mammogram: ordered - BRCA gene screening: none  Osteoporosis: Discussed high calcium  and vitamin D supplementation, weight bearing exercises  Cervical cancer screening: 09/25/2022  Skin cancer: Discussed monitoring for atypical  lesions  Colorectal cancer: does not qualify   Lung cancer:   Low Dose CT Chest recommended if Age 3-80 years, 20 pack-year currently smoking OR have quit w/in 15years. Patient does not qualify.   ECG: 11/28/2022  Advanced Care Planning: A voluntary discussion about advance care planning including the explanation and discussion of advance directives.  Discussed health care proxy and Living will, and the patient was able to identify a health care proxy as daughter, Tonya Nichols.  Patient does not have a living will at present time. If patient does have living will, I have requested they bring this to the clinic to be scanned in to their chart.  Lipids: Lab Results  Component Value Date   CHOL 148 09/25/2022   Lab Results  Component Value Date   HDL 49 (L) 09/25/2022   Lab Results  Component Value Date   LDLCALC 79 09/25/2022   Lab Results  Component Value Date   TRIG 110 09/25/2022   Lab Results  Component Value Date   CHOLHDL 3.0 09/25/2022   No results found for: LDLDIRECT  Glucose: Glucose  Date Value Ref Range Status  09/10/2012 87 65 - 99 mg/dL Final   Glucose Tolerance, Fasting  Date Value Ref Range Status  04/17/2011 71 70 - 104 mg/dL Final   Glucose, Bld  Date Value Ref Range Status  06/06/2023 87 65 - 99 mg/dL Final    Comment:    .            Fasting reference interval .   11/28/2022 86 70 - 99 mg/dL Final    Comment:    Glucose reference range applies only to samples taken after fasting for at least 8 hours.  09/25/2022 108 (H) 65 - 99 mg/dL Final    Comment:    .            Fasting reference interval . For someone without known diabetes, a glucose value between 100 and 125 mg/dL is consistent with prediabetes and should be confirmed with a follow-up test. .     Patient Active Problem List   Diagnosis Date Noted   Alcohol abuse with alcohol-induced mood disorder (HCC) 11/28/2022   Cocaine abuse (HCC) 11/28/2022   Rh negative state in  antepartum period 07/25/2021   Alcohol abuse    Cocaine use complicating pregnancy    Marijuana abuse     Past Surgical History:  Procedure Laterality Date   denies surgical history     NO PAST SURGERIES      Family History  Problem Relation Age of Onset   Hepatitis C Mother    COPD Mother    Arthritis Mother    Hepatitis C Father    Cancer Father    Healthy Brother    Asthma Daughter    Healthy Daughter    Healthy Daughter    Healthy Daughter  Healthy Daughter    Healthy Daughter    Healthy Daughter    Hearing loss Son    Healthy Son    Healthy Son    Dementia Maternal Grandmother    Heart attack Maternal Grandfather    Anesthesia problems Neg Hx    Alcohol abuse Neg Hx     Social History   Socioeconomic History   Marital status: Single    Spouse name: Not on file   Number of children: 11   Years of education: 7   Highest education level: 7th grade  Occupational History   Occupation: unemployed  Tobacco Use   Smoking status: Former    Current packs/day: 0.00    Average packs/day: 0.1 packs/day for 14.0 years (1.4 ttl pk-yrs)    Types: Cigarettes    Start date: 01/29/2008    Quit date: 01/28/2022    Years since quitting: 1.6    Passive exposure: Past   Smokeless tobacco: Never   Tobacco comments:    currently smokes 3 cigarettes per week  Vaping Use   Vaping status: Never Used  Substance and Sexual Activity   Alcohol use: Not Currently    Comment: Last drank [place] iced teas 09/22/2021   Drug use: Not Currently    Types: Marijuana, Cocaine    Comment: Last cocaine use 05/2021 and marijuana use 07/2021.   Sexual activity: Yes    Birth control/protection: None  Other Topics Concern   Not on file  Social History Narrative   Lives with  6 of her children.   Social Drivers of Corporate investment banker Strain: Low Risk  (09/26/2023)   Overall Financial Resource Strain (CARDIA)    Difficulty of Paying Living Expenses: Not hard at all  Food  Insecurity: No Food Insecurity (09/26/2023)   Hunger Vital Sign    Worried About Running Out of Food in the Last Year: Never true    Ran Out of Food in the Last Year: Never true  Transportation Needs: No Transportation Needs (09/26/2023)   PRAPARE - Administrator, Civil Service (Medical): No    Lack of Transportation (Non-Medical): No  Physical Activity: Inactive (09/26/2023)   Exercise Vital Sign    Days of Exercise per Week: 0 days    Minutes of Exercise per Session: 0 min  Stress: No Stress Concern Present (09/26/2023)   [employer] of Occupational Health - Occupational Stress Questionnaire    Feeling of Stress: Not at all  Social Connections: Moderately Integrated (09/26/2023)   Social Connection and Isolation Panel    Frequency of Communication with Friends and Family: More than three times a week    Frequency of Social Gatherings with Friends and Family: More than three times a week    Attends Religious Services: More than 4 times per year    Active Member of [employer] or Organizations: Yes    Attends Banker Meetings: More than 4 times per year    Marital Status: Never married  Intimate Partner Violence: Not At Risk (09/26/2023)   Humiliation, Afraid, Rape, and Kick questionnaire    Fear of Current or Ex-Partner: No    Emotionally Abused: No    Physically Abused: No    Sexually Abused: No     Current Outpatient Medications:    buPROPion (WELLBUTRIN XL) 150 MG 24 hr tablet, Take 150 mg by mouth daily., Disp: , Rfl:    busPIRone (BUSPAR) 7.5 MG tablet, Take 7.5 mg by mouth 2 (  two) times daily., Disp: , Rfl:    cetirizine  (ZYRTEC ) 10 MG tablet, Take 1 tablet (10 mg total) by mouth daily., Disp: 90 tablet, Rfl: 1   fluticasone  (FLONASE ) 50 MCG/ACT nasal spray, Place 2 sprays into both nostrils daily., Disp: 16 g, Rfl: 6   medroxyPROGESTERone  (DEPO-PROVERA ) 150 MG/ML injection, Inject 1 mL (150 mg total) into the muscle every 3 (three) months., Disp: 1 mL,  Rfl: 4   traZODone (DESYREL) 50 MG tablet, Take 50 mg by mouth at bedtime., Disp: , Rfl:   No Known Allergies   ROS  Constitutional: Negative for fever or weight change.  Respiratory: Negative for cough and shortness of breath.   Cardiovascular: Negative for chest pain or palpitations.  Gastrointestinal: Negative for abdominal pain, no bowel changes.  Musculoskeletal: Negative for gait problem or joint swelling.  Skin: Negative for rash.  Neurological: Negative for dizziness or headache.  No other specific complaints in a complete review of systems (except as listed in HPI above).   Objective  Vitals:   09/26/23 0939  BP: 122/70  Pulse: 76  Resp: 16  SpO2: 98%  Weight: 206 lb (93.4 kg)  Height: 5' 3 (1.6 m)    Body mass index is 36.49 kg/m.  Physical Exam Vitals reviewed.  Constitutional:      Appearance: Normal appearance.  HENT:     Head: Normocephalic.     Right Ear: Tympanic membrane normal.     Left Ear: Tympanic membrane normal.     Nose: Nose normal.  Eyes:     Extraocular Movements: Extraocular movements intact.     Conjunctiva/sclera: Conjunctivae normal.     Pupils: Pupils are equal, round, and reactive to light.  Neck:     Thyroid: No thyroid mass, thyromegaly or thyroid tenderness.  Cardiovascular:     Rate and Rhythm: Normal rate and regular rhythm.     Pulses: Normal pulses.     Heart sounds: Normal heart sounds.  Pulmonary:     Effort: Pulmonary effort is normal.     Breath sounds: Normal breath sounds.  Abdominal:     General: Bowel sounds are normal.     Palpations: Abdomen is soft.  Musculoskeletal:        General: Normal range of motion.     Cervical back: Normal range of motion and neck supple.     Right lower leg: No edema.     Left lower leg: No edema.  Skin:    General: Skin is warm and dry.     Capillary Refill: Capillary refill takes less than 2 seconds.  Neurological:     General: No focal deficit present.     Mental  Status: She is alert and oriented to person, place, and time. Mental status is at baseline.  Psychiatric:        Mood and Affect: Mood normal.        Behavior: Behavior normal.        Thought Content: Thought content normal.        Judgment: Judgment normal.       Fall Risk:    09/26/2023    9:34 AM 06/06/2023    1:38 PM 10/24/2022    3:22 PM 09/25/2022   10:47 AM 08/22/2022   10:29 AM  Fall Risk   Falls in the past year? 0 0 0 0 0  Number falls in past yr: 0 0 0 0 0  Injury with Fall? 0 0 0 0 0  Risk  for fall due to : No Fall Risks      Follow up Falls prevention discussed Falls evaluation completed        Functional Status Survey: Is the patient deaf or have difficulty hearing?: No Does the patient have difficulty seeing, even when wearing glasses/contacts?: No Does the patient have difficulty concentrating, remembering, or making decisions?: No Does the patient have difficulty walking or climbing stairs?: No Does the patient have difficulty dressing or bathing?: No Does the patient have difficulty doing errands alone such as visiting a doctor's office or shopping?: No   Assessment & Plan  Problem List Items Addressed This Visit   None Visit Diagnoses       Annual physical exam    -  Primary   Relevant Orders   MM 3D SCREENING MAMMOGRAM BILATERAL BREAST     Encounter for screening mammogram for malignant neoplasm of breast       Relevant Orders   MM 3D SCREENING MAMMOGRAM BILATERAL BREAST     Polyarthralgia       Relevant Orders   Ambulatory referral to Rheumatology     Positive ANA (antinuclear antibody)       Relevant Orders   Ambulatory referral to Rheumatology     Birth control counseling       Relevant Medications   medroxyPROGESTERone  (DEPO-PROVERA ) 150 MG/ML injection   Other Relevant Orders   POCT urine pregnancy (Completed)     Changes in vision       Relevant Orders   Ambulatory referral to Optometry     Allergic rhinitis due to pollen, unspecified  seasonality       Relevant Medications   cetirizine  (ZYRTEC ) 10 MG tablet   fluticasone  (FLONASE ) 50 MCG/ACT nasal spray      Assessment and Plan Assessment & Plan Adult Wellness Visit Routine adult wellness visit with well-controlled blood pressure and weight. No signs of domestic violence. Not currently sexually active. Last menstrual cycle was last month but irregular. Last dental exam was at the beginning of the year. Last eye exam is unknown. - Perform physical examination - Provide mammogram card for scheduling - Encourage self-breast exams - Advise scheduling [facility]  Contraceptive management Desires contraceptive management to prevent pregnancy. Hormonal levels (FSH, LH, estrogen) are normal, indicating no menopausal transition. Prefers Depo-Provera  due to past effectiveness. - Order pregnancy test - Prescribe Depo-Provera  and send prescription to [facility] - Instruct to pick up prescription and return for administration  Allergic rhinitis Experiencing symptoms of allergic rhinitis. Over-the-counter Benadryl  has been ineffective. Discussed alternative over-the-counter options such as Zyrtec , Claritin, and Allegra, which have less sedating effects. Flonase  nasal spray recommended for additional relief. - Prescribe Flonase  nasal spray and send prescription to [facility]  Urinary incontinence Reports urinary incontinence. Advised on Kegel exercises to strengthen pelvic floor muscles and improve symptoms. - Educate on performing Kegel exercises regularly, especially during routine activities like stopping at traffic lights  Insomnia Reports difficulty sleeping, recently resumed trazodone. Sleep disturbances may be related to recent sobriety and lifestyle changes. - Continue trazodone for sleep management  Substance use disorder, in remission Substance use disorder currently in remission. She is 34 days sober and attending classes.    -USPSTF  grade A and B recommendations reviewed with patient; age-appropriate recommendations, preventive care, screening tests, etc discussed and encouraged; healthy living encouraged; see AVS for patient education given to patient -Discussed importance of 150 minutes of physical activity weekly, eat two servings  of fish weekly, eat one serving of tree nuts ( cashews, pistachios, pecans, almonds.SABRA) every other day, eat 6 servings of fruit/vegetables daily and drink plenty of water and avoid sweet beverages.   -Reviewed Health Maintenance: yes

## 2023-09-27 ENCOUNTER — Ambulatory Visit: Payer: MEDICAID

## 2023-11-24 ENCOUNTER — Ambulatory Visit: Payer: Self-pay

## 2023-12-11 ENCOUNTER — Telehealth: Payer: Self-pay

## 2023-12-11 NOTE — Telephone Encounter (Signed)
 Copied from CRM [id number]. Topic: Clinical - Medical Advice >> Dec 11, 2023 12:48 PM [name] wrote: Reason for CRM: Pt called to inquire about when she is due for her next depo injection? Please advise   Best contact: [phone]

## 2023-12-11 NOTE — Telephone Encounter (Signed)
 No documentation of shot last prescribed 7/23

## 2024-01-18 ENCOUNTER — Telehealth: Payer: Self-pay | Admitting: Nurse Practitioner

## 2024-01-18 ENCOUNTER — Telehealth: Payer: Self-pay

## 2024-01-18 NOTE — Telephone Encounter (Signed)
 Copied from CRM #[id number]. Topic: Clinical - Medication Question >> Jan 18, 2024 11:42 AM Avram MATSU wrote: Reason for CRM: patient would like her depo shot to be sent to   [facility], [place] - [address] [place] [address] Phone: [phone] Fax: [phone]  Please call patient once the order has been sent [phone] (M)

## 2024-01-18 NOTE — Telephone Encounter (Signed)
 Copied from CRM #[id number]. Topic: Referral - Question >> Jan 18, 2024 11:44 AM Avram MATSU wrote: Reason for CRM: patient would like to know if she can have a referral for psychiatry, patient would like to go to [facility] .

## 2024-01-18 NOTE — Telephone Encounter (Signed)
 Rx sent to that pharmacy in July w/3 refills. Lvm informing patient.

## 2024-01-21 NOTE — Telephone Encounter (Signed)
 Appt sch'd for this coming Friday with Julie

## 2024-01-25 ENCOUNTER — Ambulatory Visit: Payer: MEDICAID | Admitting: Nurse Practitioner
